# Patient Record
Sex: Male | Born: 1935 | Race: White | Hispanic: No | Smoking: Former smoker
Health system: Southern US, Community
[De-identification: ages and names within clinical notes are randomized; demographics above are authoritative.]

## PROBLEM LIST (undated history)

## (undated) DIAGNOSIS — I10 Essential (primary) hypertension: Secondary | ICD-10-CM

## (undated) DIAGNOSIS — K000 Anodontia: Principal | ICD-10-CM

## (undated) DIAGNOSIS — I5022 Chronic systolic (congestive) heart failure: Secondary | ICD-10-CM

## (undated) DIAGNOSIS — K08109 Complete loss of teeth, unspecified cause, unspecified class: Secondary | ICD-10-CM

## (undated) DIAGNOSIS — I779 Disorder of arteries and arterioles, unspecified: Secondary | ICD-10-CM

## (undated) DIAGNOSIS — E785 Hyperlipidemia, unspecified: Secondary | ICD-10-CM

## (undated) DIAGNOSIS — C8590 Non-Hodgkin lymphoma, unspecified, unspecified site: Secondary | ICD-10-CM

## (undated) DIAGNOSIS — N183 Chronic kidney disease, stage 3 unspecified: Secondary | ICD-10-CM

## (undated) DIAGNOSIS — R042 Hemoptysis: Principal | ICD-10-CM

## (undated) DIAGNOSIS — I2510 Atherosclerotic heart disease of native coronary artery without angina pectoris: Secondary | ICD-10-CM

## (undated) DIAGNOSIS — I340 Nonrheumatic mitral (valve) insufficiency: Secondary | ICD-10-CM

## (undated) DIAGNOSIS — I739 Peripheral vascular disease, unspecified: Secondary | ICD-10-CM

## (undated) DIAGNOSIS — I4891 Unspecified atrial fibrillation: Secondary | ICD-10-CM

## (undated) DIAGNOSIS — N3289 Other specified disorders of bladder: Secondary | ICD-10-CM

## (undated) HISTORY — PX: SOFT TISSUE BIOPSY: SHX1053

## (undated) HISTORY — DX: Anodontia: K00.0

## (undated) HISTORY — DX: Atherosclerotic heart disease of native coronary artery without angina pectoris: I25.10

## (undated) HISTORY — DX: Hemoptysis: R04.2

## (undated) HISTORY — DX: Unspecified atrial fibrillation: I48.91

## (undated) HISTORY — DX: Peripheral vascular disease, unspecified: I73.9

## (undated) HISTORY — DX: Hyperlipidemia, unspecified: E78.5

## (undated) HISTORY — DX: Complete loss of teeth, unspecified cause, unspecified class: K08.109

## (undated) HISTORY — DX: Nonrheumatic mitral (valve) insufficiency: I34.0

## (undated) HISTORY — PX: PORTACATH PLACEMENT: SHX2246

---

## 2001-03-12 ENCOUNTER — Encounter: Payer: Self-pay | Admitting: Cardiology

## 2001-03-12 ENCOUNTER — Inpatient Hospital Stay (HOSPITAL_COMMUNITY): Admission: EM | Admit: 2001-03-12 | Discharge: 2001-03-26 | Payer: Self-pay | Admitting: *Deleted

## 2001-03-13 ENCOUNTER — Encounter: Payer: Self-pay | Admitting: Cardiology

## 2001-03-14 ENCOUNTER — Encounter: Payer: Self-pay | Admitting: Cardiology

## 2001-03-15 ENCOUNTER — Encounter: Payer: Self-pay | Admitting: Thoracic Surgery (Cardiothoracic Vascular Surgery)

## 2001-03-15 ENCOUNTER — Encounter: Payer: Self-pay | Admitting: Cardiology

## 2001-03-15 HISTORY — PX: CORONARY ARTERY BYPASS GRAFT: SHX141

## 2001-03-16 ENCOUNTER — Encounter: Payer: Self-pay | Admitting: Thoracic Surgery (Cardiothoracic Vascular Surgery)

## 2001-03-17 ENCOUNTER — Encounter: Payer: Self-pay | Admitting: Thoracic Surgery (Cardiothoracic Vascular Surgery)

## 2001-03-18 ENCOUNTER — Encounter: Payer: Self-pay | Admitting: Thoracic Surgery (Cardiothoracic Vascular Surgery)

## 2001-03-19 ENCOUNTER — Encounter: Payer: Self-pay | Admitting: Thoracic Surgery (Cardiothoracic Vascular Surgery)

## 2001-03-20 ENCOUNTER — Encounter: Payer: Self-pay | Admitting: Thoracic Surgery (Cardiothoracic Vascular Surgery)

## 2001-03-21 ENCOUNTER — Encounter: Payer: Self-pay | Admitting: Thoracic Surgery (Cardiothoracic Vascular Surgery)

## 2001-03-22 ENCOUNTER — Encounter: Payer: Self-pay | Admitting: Thoracic Surgery (Cardiothoracic Vascular Surgery)

## 2001-03-23 ENCOUNTER — Encounter: Payer: Self-pay | Admitting: Thoracic Surgery (Cardiothoracic Vascular Surgery)

## 2001-04-23 ENCOUNTER — Inpatient Hospital Stay (HOSPITAL_COMMUNITY): Admission: EM | Admit: 2001-04-23 | Discharge: 2001-04-25 | Payer: Self-pay | Admitting: Emergency Medicine

## 2001-04-23 ENCOUNTER — Encounter: Payer: Self-pay | Admitting: Emergency Medicine

## 2004-11-26 ENCOUNTER — Ambulatory Visit: Payer: Self-pay | Admitting: Cardiology

## 2006-03-02 ENCOUNTER — Ambulatory Visit: Payer: Self-pay | Admitting: Cardiology

## 2006-03-07 ENCOUNTER — Ambulatory Visit: Payer: Self-pay | Admitting: Cardiology

## 2006-03-15 ENCOUNTER — Ambulatory Visit: Payer: Self-pay | Admitting: Cardiology

## 2006-04-12 ENCOUNTER — Ambulatory Visit: Payer: Self-pay | Admitting: Cardiology

## 2006-04-13 ENCOUNTER — Ambulatory Visit: Payer: Self-pay | Admitting: Cardiology

## 2006-06-22 ENCOUNTER — Ambulatory Visit: Payer: Self-pay | Admitting: Cardiology

## 2006-06-24 ENCOUNTER — Emergency Department (HOSPITAL_COMMUNITY): Admission: EM | Admit: 2006-06-24 | Discharge: 2006-06-24 | Payer: Self-pay | Admitting: *Deleted

## 2006-06-26 ENCOUNTER — Ambulatory Visit: Payer: Self-pay | Admitting: Cardiology

## 2006-06-29 ENCOUNTER — Ambulatory Visit: Payer: Self-pay | Admitting: Cardiology

## 2006-08-21 ENCOUNTER — Ambulatory Visit: Payer: Self-pay | Admitting: Cardiology

## 2006-09-04 ENCOUNTER — Ambulatory Visit: Payer: Self-pay

## 2006-09-04 ENCOUNTER — Encounter: Payer: Self-pay | Admitting: Cardiology

## 2006-09-04 ENCOUNTER — Ambulatory Visit: Payer: Self-pay | Admitting: Cardiology

## 2006-09-07 ENCOUNTER — Ambulatory Visit: Payer: Self-pay | Admitting: Cardiology

## 2006-10-04 ENCOUNTER — Ambulatory Visit: Payer: Self-pay | Admitting: Cardiology

## 2006-10-04 ENCOUNTER — Ambulatory Visit: Payer: Self-pay

## 2007-03-05 ENCOUNTER — Ambulatory Visit: Payer: Self-pay | Admitting: Cardiology

## 2007-03-19 ENCOUNTER — Ambulatory Visit: Payer: Self-pay | Admitting: Cardiology

## 2007-03-19 LAB — CONVERTED CEMR LAB
ALT: 18 units/L (ref 0–40)
AST: 20 units/L (ref 0–37)
Albumin: 3.9 g/dL (ref 3.5–5.2)
Alkaline Phosphatase: 56 units/L (ref 39–117)
Bilirubin, Direct: 0.2 mg/dL (ref 0.0–0.3)
Cholesterol: 133 mg/dL (ref 0–200)
HDL: 54 mg/dL (ref >39.0)
LDL Cholesterol: 63 mg/dL (ref 0–99)
Pro B Natriuretic peptide (BNP): 271 pg/mL — ABNORMAL HIGH (ref 0.0–100.0)
Total Bilirubin: 0.8 mg/dL (ref 0.3–1.2)
Total CHOL/HDL Ratio: 2.5
Total Protein: 6.8 g/dL (ref 6.0–8.3)
Triglycerides: 78 mg/dL (ref 0–149)
VLDL: 16 mg/dL (ref 0–40)

## 2007-09-13 ENCOUNTER — Ambulatory Visit: Payer: Self-pay | Admitting: Cardiology

## 2007-09-26 ENCOUNTER — Ambulatory Visit: Payer: Self-pay

## 2008-02-28 ENCOUNTER — Ambulatory Visit: Payer: Self-pay | Admitting: Cardiology

## 2008-10-13 ENCOUNTER — Ambulatory Visit: Payer: Self-pay | Admitting: Cardiology

## 2009-11-18 DIAGNOSIS — N183 Chronic kidney disease, stage 3 unspecified: Secondary | ICD-10-CM | POA: Insufficient documentation

## 2009-11-18 DIAGNOSIS — I739 Peripheral vascular disease, unspecified: Secondary | ICD-10-CM

## 2009-11-18 DIAGNOSIS — I2510 Atherosclerotic heart disease of native coronary artery without angina pectoris: Secondary | ICD-10-CM | POA: Insufficient documentation

## 2009-11-18 DIAGNOSIS — F17200 Nicotine dependence, unspecified, uncomplicated: Secondary | ICD-10-CM

## 2009-11-18 DIAGNOSIS — I4891 Unspecified atrial fibrillation: Secondary | ICD-10-CM

## 2009-11-23 ENCOUNTER — Ambulatory Visit: Payer: Self-pay | Admitting: Cardiology

## 2009-11-23 DIAGNOSIS — E785 Hyperlipidemia, unspecified: Secondary | ICD-10-CM | POA: Insufficient documentation

## 2009-11-26 ENCOUNTER — Ambulatory Visit: Payer: Self-pay | Admitting: Cardiology

## 2009-12-07 LAB — CONVERTED CEMR LAB
ALT: 16 units/L (ref 0–53)
AST: 20 units/L (ref 0–37)
Albumin: 3.8 g/dL (ref 3.5–5.2)
BUN: 41 mg/dL — ABNORMAL HIGH (ref 6–23)
Chloride: 107 meq/L (ref 96–112)
Cholesterol: 141 mg/dL (ref 0–200)
GFR calc non Af Amer: 36.51 mL/min (ref >60)
Glucose, Bld: 100 mg/dL — ABNORMAL HIGH (ref 70–99)
Potassium: 4.8 meq/L (ref 3.5–5.1)
Sodium: 140 meq/L (ref 135–145)

## 2009-12-28 ENCOUNTER — Ambulatory Visit: Payer: Self-pay | Admitting: Cardiology

## 2009-12-28 LAB — CONVERTED CEMR LAB
BUN: 34 mg/dL — ABNORMAL HIGH (ref 6–23)
CO2: 29 meq/L (ref 19–32)
Chloride: 105 meq/L (ref 96–112)
Glucose, Bld: 96 mg/dL (ref 70–99)
Potassium: 5.3 meq/L — ABNORMAL HIGH (ref 3.5–5.1)

## 2010-01-04 ENCOUNTER — Ambulatory Visit: Payer: Self-pay | Admitting: Cardiology

## 2010-01-11 LAB — CONVERTED CEMR LAB
CO2: 29 meq/L (ref 19–32)
Calcium: 9.2 mg/dL (ref 8.4–10.5)
Creatinine, Ser: 1.6 mg/dL — ABNORMAL HIGH (ref 0.4–1.5)
Glucose, Bld: 92 mg/dL (ref 70–99)

## 2010-10-12 ENCOUNTER — Encounter: Payer: Self-pay | Admitting: Cardiology

## 2010-10-21 ENCOUNTER — Encounter: Payer: Self-pay | Admitting: Cardiology

## 2010-10-21 DIAGNOSIS — I6529 Occlusion and stenosis of unspecified carotid artery: Secondary | ICD-10-CM | POA: Insufficient documentation

## 2010-10-22 ENCOUNTER — Ambulatory Visit: Payer: Self-pay

## 2010-10-22 ENCOUNTER — Encounter: Payer: Self-pay | Admitting: Cardiology

## 2010-11-23 ENCOUNTER — Encounter: Payer: Self-pay | Admitting: Cardiology

## 2010-11-25 ENCOUNTER — Ambulatory Visit: Payer: Self-pay | Admitting: Cardiology

## 2010-12-02 ENCOUNTER — Telehealth (INDEPENDENT_AMBULATORY_CARE_PROVIDER_SITE_OTHER): Payer: Self-pay | Admitting: *Deleted

## 2010-12-06 ENCOUNTER — Encounter (INDEPENDENT_AMBULATORY_CARE_PROVIDER_SITE_OTHER): Payer: Self-pay | Admitting: *Deleted

## 2010-12-06 ENCOUNTER — Encounter (HOSPITAL_COMMUNITY)
Admission: RE | Admit: 2010-12-06 | Discharge: 2011-01-25 | Payer: Self-pay | Source: Home / Self Care | Attending: Cardiology | Admitting: Cardiology

## 2010-12-06 ENCOUNTER — Ambulatory Visit: Payer: Self-pay | Admitting: Cardiology

## 2010-12-06 ENCOUNTER — Ambulatory Visit: Payer: Self-pay

## 2010-12-06 ENCOUNTER — Encounter: Payer: Self-pay | Admitting: Cardiology

## 2010-12-06 DIAGNOSIS — E782 Mixed hyperlipidemia: Secondary | ICD-10-CM

## 2010-12-13 LAB — CONVERTED CEMR LAB
AST: 22 units/L (ref 0–37)
Alkaline Phosphatase: 55 units/L (ref 39–117)
CO2: 27 meq/L (ref 19–32)
Calcium: 9.5 mg/dL (ref 8.4–10.5)
Creatinine, Ser: 1.5 mg/dL (ref 0.4–1.5)
Glucose, Bld: 93 mg/dL (ref 70–99)
Sodium: 141 meq/L (ref 135–145)
Total Bilirubin: 0.8 mg/dL (ref 0.3–1.2)
Total CHOL/HDL Ratio: 3

## 2010-12-24 ENCOUNTER — Ambulatory Visit: Admission: RE | Admit: 2010-12-24 | Discharge: 2010-12-24 | Payer: Self-pay | Source: Home / Self Care

## 2010-12-24 LAB — CONVERTED CEMR LAB
Chloride: 107 meq/L (ref 96–112)
GFR calc non Af Amer: 47.1 mL/min — ABNORMAL LOW (ref >60.00)
Glucose, Bld: 80 mg/dL (ref 70–99)
Potassium: 5.9 meq/L — ABNORMAL HIGH (ref 3.5–5.1)
Sodium: 143 meq/L (ref 135–145)

## 2010-12-28 ENCOUNTER — Other Ambulatory Visit: Payer: Self-pay | Admitting: Cardiology

## 2010-12-28 ENCOUNTER — Ambulatory Visit: Admission: RE | Admit: 2010-12-28 | Discharge: 2010-12-28 | Payer: Self-pay | Source: Home / Self Care

## 2010-12-28 LAB — BASIC METABOLIC PANEL
BUN: 26 mg/dL — ABNORMAL HIGH (ref 6–23)
CO2: 29 mEq/L (ref 19–32)
Calcium: 9.5 mg/dL (ref 8.4–10.5)
Chloride: 104 mEq/L (ref 96–112)
Creatinine, Ser: 1.6 mg/dL — ABNORMAL HIGH (ref 0.4–1.5)
GFR: 44.08 mL/min — ABNORMAL LOW (ref >60.00)
Glucose, Bld: 74 mg/dL (ref 70–99)
Potassium: 5.1 mEq/L (ref 3.5–5.1)
Sodium: 140 mEq/L (ref 135–145)

## 2010-12-31 ENCOUNTER — Telehealth: Payer: Self-pay | Admitting: Cardiology

## 2011-01-25 NOTE — Miscellaneous (Signed)
  Clinical Lists Changes  Observations: Added new observation of US CAROTID: Essentially stable, mild mixed plaque, bilaterally Elevated LECA velocities 0-39% bilateral ICA stenosis Elevated subclavian artery velocities, bilaterally  f/u 2 years (10/22/2010 10:30)      Carotid Doppler  Procedure date:  10/22/2010  Findings:      Essentially stable, mild mixed plaque, bilaterally Elevated LECA velocities 0-39% bilateral ICA stenosis Elevated subclavian artery velocities, bilaterally  f/u 2 years

## 2011-01-25 NOTE — Miscellaneous (Signed)
Summary: Orders Update  Clinical Lists Changes  Problems: Added new problem of CAROTID ARTERY DISEASE (ICD-433.10) Orders: Added new Test order of Carotid Duplex (Carotid Duplex) - Signed 

## 2011-01-25 NOTE — Miscellaneous (Signed)
Summary: Fluzone Multidose Vial   Fluzone Multidose Vial   Imported By: Roderic Ovens 10/19/2010 09:34:01  _____________________________________________________________________  External Attachment:    Type:   Image     Comment:   External Document

## 2011-01-25 NOTE — Progress Notes (Signed)
Summary: Nuclear pre procedure  Phone Note Outgoing Call Call back at [facility] Phone [phone]   Call placed by: Rea College, CMA,  December 02, 2010 4:43 PM Call placed to: Patient Summary of Call: Reviewed information on Myoview Information Sheet (see scanned document for further details).  Thurston Hole spoke with patient.      Nuclear Med Background Indications for Stress Test: Evaluation for Ischemia, Graft Patency   History: CABG, Echo, Myocardial Infarction  History Comments: '02 CABG, mitral ring annuloplasty     Nuclear Pre-Procedure Cardiac Risk Factors: History of Smoking, Hypertension, Lipids, PVD, RBBB Height (in): 72

## 2011-01-25 NOTE — Assessment & Plan Note (Signed)
Summary: 414.01 1 YR ROV  PFH,RN      Allergies Added: NKDA  Visit Type:  Follow-up Primary Provider:  None  CC:  CAD.  History of Present Illness: The patient presents for followup of coronary disease. Since I last saw him he has done quite well. He does his activities of daily living and this includes light household chores and grocery shopping. With this he denies any chest pressure, neck or arm discomfort. He has no palpitations, presyncope or syncope. He has no PND or orthopnea. He has had no weight gain or edema.  Current Medications (verified): 1)  Vytorin 10-40 Mg Tabs (Ezetimibe-Simvastatin) .[name].. 1 By Mouth Daily 2)  Coreg 25 Mg Tabs (Carvedilol) .[name].. 1 By Mouth Two Times A Day 3)  Enalapril Maleate 10 Mg Tabs (Enalapril Maleate) .... 1/2 By Mouth Daily 4)  Aspirin 81 Mg  Tabs (Aspirin) .[name].. 1 By Mouth Daily 5)  Multivitamins   Tabs (Multiple Vitamin) .[name].. 1 By Mouth Daily  Allergies (verified): No Known Drug Allergies  Past History:  Past Medical History: Reviewed history from 11/18/2009 and no changes required.  1. Coronary artery disease status post coronary artery bypass graft       (Left internal mammary artery  to the left anterior descending       artery, saphenous vein graft to diagonal, saphenous vein graft to       circumflex marginal, saphenous vein graft to posterior descending       artery and sequential saphenous vein graft  to posterolateral).   2. Mildly reduced ejection fraction (40%).   3. Peripheral vascular disease.   4. Mitral ring annuloplasty.   5. Previous tobacco abuse.   6. Postoperative atrial fibrillation.   7. Mild chronic renal insufficiency.   Past Surgical History: Reviewed history from 11/23/2009 and no changes required.  CABG - Coronary artery bypass graft March 2002  Review of Systems       As stated in the HPI and negative for all other systems.   Vital Signs:  Patient profile:   77 year old male Height:      72  inches Weight:      182 pounds BMI:     24.77 Pulse rate:   58 / minute Resp:     16 per minute BP sitting:   124 / 72  (right arm)  Vitals Entered By: Marrion Coy, CNA (November 25, 2010 8:57 AM)  Physical Exam  General:  Well developed, well nourished, in no acute distress. Head:  normocephalic and atraumatic Eyes:  PERRLA/EOM intact; conjunctiva and lids normal. Mouth:  Gums and palate normal. Oral mucosa normal. Neck:  Neck supple, no JVD. No masses, thyromegaly or abnormal cervical nodes. Chest Wall:  well healed sternotomy scar Lungs:  Clear bilaterally to auscultation and percussion. Abdomen:  Bowel sounds positive; abdomen soft and non-tender without masses, organomegaly, or hernias noted. No hepatosplenomegaly. Msk:  Back normal, normal gait. Muscle strength and tone normal. Skin:  Intact without lesions or rashes. Cervical Nodes:  no significant adenopathy Inguinal Nodes:  no significant adenopathy Psych:  Normal affect.   Detailed Cardiovascular Exam  Neck    Carotids: Carotids full and equal bilaterally without bruits.      Neck Veins: Normal, no JVD.    Heart    Inspection: no deformities or lifts noted.      Palpation: normal PMI with no thrills palpable.      Auscultation: regular rate and rhythm, S1, S2  without murmurs, rubs, gallops, or clicks.    Vascular    Abdominal Aorta: no palpable masses, pulsations, or audible bruits.      Femoral Pulses: normal femoral pulses bilaterally.      Pedal Pulses: diminished right dorsalis pedis pulse, diminished right posterior tibial pulse, diminished left dorsalis pedis pulse, and diminished left posterior tibial pulse.      Radial Pulses: normal radial pulses bilaterally.      Peripheral Circulation: no clubbing, cyanosis, or edema noted with normal capillary refill.     EKG  Procedure date:  11/25/2010  Findings:      Sinus rhythm, rate 61, right axis deviation, right bundle branch block  Impression &  Recommendations:  Problem # 1:  CAD (ICD-414.00) He is overdue for a stress perfusion study.  I will order this and continue with risk reduction. Orders: EKG w/ Interpretation (93000)  Problem # 2:  DYSLIPIDEMIA (ICD-272.4) I will check a lipid profile when he returns fasting. His updated medication list for this problem includes:    Vytorin 10-40 Mg Tabs (Ezetimibe-simvastatin) .[name]... 1 by mouth daily  Problem # 3:  RENAL INSUFFICIENCY, CHRONIC (ICD-585.9) I will check a BMET.  Patient Instructions: 1)  Your physician recommends that you schedule a follow-up appointment in: 1 year with Dr Antoine Poche 2)  Your physician recommends that you continue on your current medications as directed. Please refer to the Current Medication list given to you today. 3)  Your physician recommends that you return for a FASTING lipid and liver profile:  272.2  v58.69  4)  Your physician has requested that you have an exercise stress myoview.  For further information please visit [email or web address]  Please follow instruction sheet, as given.

## 2011-01-25 NOTE — Miscellaneous (Signed)
Summary: lexiscan myoview order  Clinical Lists Changes  Orders: Added new Referral order of Nuclear Stress Test (Nuc Stress Test) - Signed

## 2011-01-27 NOTE — Assessment & Plan Note (Signed)
Summary: Cardiology Nuclear Testing  Nuclear Med Background Indications for Stress Test: Evaluation for Ischemia, Graft Patency   History: Abnormal EKG, CABG, Echo, Myocardial Infarction  History Comments: '02 CABG, mitral ring annuloplasty  Symptoms: Dizziness, Light-Headedness, Palpitations    Nuclear Pre-Procedure Cardiac Risk Factors: History of Smoking, Hypertension, Lipids, PVD, RBBB Caffeine/Decaff Intake: None NPO After: 6:00 PM Lungs: clear IV 0.9% NS with Angio Cath: 22g     IV Site: R Forearm IV Started by: Irean Hong, RN Chest Size (in) 40     Height (in): 72 Weight (lb): 180 BMI: 24.50 Tech Comments: Held coreg this am.  Nuclear Med Study 1 or 2 day study:  1 day     Stress Test Type:  Eugenie Birks Reading MD:  Olga Millers, MD     Referring MD:  J.Hochrein Resting Radionuclide:  Technetium 74m Tetrofosmin     Resting Radionuclide Dose:  11 mCi  Stress Radionuclide:  Technetium 34m Tetrofosmin     Stress Radionuclide Dose:  33 mCi   Stress Protocol   Lexiscan: 0.4 mg   Stress Test Technologist:  Milana Na, EMT-P     Nuclear Technologist:  Doyne Keel, CNMT  Rest Procedure  Myocardial perfusion imaging was performed at rest 45 minutes following the intravenous administration of Technetium 8m Tetrofosmin.  Stress Procedure  The patient received IV Lexiscan 0.4 mg over 15-seconds.  Technetium 20m Tetrofosmin injected at 30-seconds.  There were no significant changes with infusion.  Quantitative spect images were obtained after a 45 minute delay.  QPS Raw Data Images:  There is no interference from other nuclear activity. Stress Images:  There is decreased uptake in the inferior wall Rest Images:  There is decreased uptake in the inferior wall. Subtraction (SDS):  There is a fixed defect that is most consistent with a previous infarction. Transient Ischemic Dilatation:  1.07  (Normal <1.22)  Lung/Heart Ratio:  0.27  (Normal <0.45)  Quantitative  Gated Spect Images QGS EDV:  150 ml QGS ESV:  100 ml QGS EF:  33 % QGS cine images:  Global hypokinesis; left ventricular enlargement.   Overall Impression  Exercise Capacity: Lexiscan with no exercise. BP Response: Normal blood pressure response. Clinical Symptoms: No chest pain ECG Impression: No significant ST segment change suggestive of ischemia. Overall Impression: Abnormal lexiscan nuclear study with prior inferior infarct; no ischemia.  Appended Document: Cardiology Nuclear Testing Old infarct.  I suspect that the EF is about the same as previous.  Continue current meds.  Appended Document: Cardiology Nuclear Testing pt aware of results

## 2011-01-27 NOTE — Letter (Signed)
Summary: Outpatient Coinsurance Notice  Outpatient Coinsurance Notice   Imported By: Marylou Mccoy 12/13/2010 18:51:49  _____________________________________________________________________  External Attachment:    Type:   Image     Comment:   External Document

## 2011-01-27 NOTE — Progress Notes (Signed)
Summary: TEST RESULTS   Phone Note Call from Patient   Caller: SISTER/ MARIE Reason for Call: Lab or Test Results Initial call taken by: Judie Grieve,  December 31, 2010 3:45 PM  Follow-up for Phone Call        patient aware of test results. Whitney Maeola Sarah RN  December 31, 2010 3:49 PM  Follow-up by: Whitney Maeola Sarah RN,  December 31, 2010 3:49 PM

## 2011-05-10 NOTE — Assessment & Plan Note (Signed)
[facility]                            CARDIOLOGY OFFICE NOTE   NAME:Gabriel Estrada, Gabriel Estrada                       MRN:          [id number]  DATE:02/28/2008                            DOB:          September 23, 1928    PRIMARY CARE PHYSICIAN:  [name], MD   REASON FOR PRESENTATION:  Evaluate patient with coronary artery disease.   HISTORY OF PRESENT ILLNESS:  The patient is now a 77 year old white  gentleman with coronary disease as described below.  He has done quite  well since I last saw him.  He gets along doing some exercises in his  house though his sister reports he is not as active as he reports.  With  this level of activity, he denies any chest discomfort, neck or arm  discomfort.  He has no palpitation, pre-syncope, syncope.  He has had no  PND or orthopnea.   PAST MEDICAL HISTORY:  1. Coronary artery disease status post coronary artery bypass graft      (Left internal mammary artery  to the left anterior descending      artery, saphenous vein graft to diagonal, saphenous vein graft to      circumflex marginal, saphenous vein graft to posterior descending      artery and sequential saphenous vein graft  to posterolateral).  2. Mildly reduced ejection fraction (40%).  3. Peripheral vascular disease.  4. Mitral ring annuloplasty.  5. Previous tobacco abuse.  6. Postoperative atrial fibrillation.  7. Mild chronic renal insufficiency.   ALLERGIES:  None.   MEDICATIONS:  1. Carvedilol 25 mg b.i.d.  2. Multivitamin.  3. Aspirin 81 mg daily.  4. Vytorin.  5. Enalapril 5 mg daily.   REVIEW OF SYSTEMS:  As stated in the HPI and otherwise negative for  other systems.   PHYSICAL EXAMINATION:  The patient is in no distress.  Blood pressure  134/80, heart rate 65 and regular, weight 183 pounds, body mass index  24.  HEENT: Eyelids unremarkable. Pupils equal, round, and reactive to light.  Fundi not visualized.  NECK: No jugular venous  distention at 45 degrees. Carotid upstroke brisk  and symmetrical. No bruits, no thyromegaly.  LYMPHATICS: No adenopathy.  LUNGS: Clear to auscultation bilaterally.  BACK: No costovertebral angle tenderness.  CHEST: Well-healed sternotomy scar.  HEART: PMI not displaced or sustained, S1-S2 within normal limits, no S3-  S4, 2/6 apical systolic murmur, holosystolic murmur radiating slightly  to the axilla, no diastolic murmurs.  ABDOMEN: Flat, positive bowel sounds, normal in frequency and pitch. No  bruits. No rebound. No guarding. No midline pulsatile mass. No  organomegaly.  SKIN: No rashes, no nodules.  EXTREMITIES: 2+ upper pulse, absent right femoral, 2+ left femoral, left  femoral bruit, absent dorsalis pedis and post tibialis bilaterally, no  cyanosis or clubbing, trace edema.  NEURO:  Oriented to person, place, time, cranial nerves II-XII grossly  intact, motor grossly intact.   EKG: Sinus rhythm, rate 65, interventricular conduction delay, premature  atrial contractions, right axis deviation,  no change from previous  EKGs.   ASSESSMENT/PLAN:  1. Coronary  artery disease.  The patient is having no symptoms related      this. No further cardiovascular testing is suggested.  He will      continue with risk reduction.  2. Hypertension.  Blood pressure is well-controlled and he will      continue on the medications as listed.  3. Cardiomyopathy.  The patient had a mildly reduced ejection      fraction.  He has class I symptoms.  He is on reasonable      medications and will continue with this.  The last ejection      fraction was actually 50%.  4. Peripheral vascular disease.  He is having this followed and      treated with risk reduction as he is not having significant      symptoms.  Does have carotid duplex again in October.  5. Mild chronic renal insufficiency, is followed by Dr. Clent Ridges.  6. Dyslipidemia.  Continue medications as listed.  He has had an      excellent lipid  profile with an HDL 54 and LDL of 63 last year.  7. Followup. I will see him back in 1 year or sooner if needed.     [name], MD, [facility]  Electronically Signed    JH/MedQ  DD: 02/28/2008  DT: 02/28/2008  Job #: [id number]   cc:   [name], MD

## 2011-05-10 NOTE — Assessment & Plan Note (Signed)
Gab[facility]str[facility]                            CARDIOLOGY OFFICE NOTE   NAME:Gabriel Estrada, Gabriel Estrada                       MRN:          [id number]  DATE:09/13/2007                            DOB:          1928/06/15    The primary is Tera Mater. Clent Ridges, MD   REASON FOR PRESENTATION:  Evaluate patient with coronary disease.   HISTORY OF PRESENT ILLNESS:  The patient is a very pleasant 77 year old  gentleman with a prior history as described below.  He has been doing  well since I last saw him other than having lightheadedness.  He thinks  he notices this most when he goes from a seated to a standing position.  It is a slight dizziness.  He does not describe any gait disturbance.  He has had no falls.  He has had no presyncope or syncope.  He has had  no palpitations.  He denies any vertiginous symptoms.  He has had no  loss of speech or motor or visual disturbance.  He denies any chest  discomfort, neck or arm discomfort.  He has had no new shortness of  breath, denies any PND or orthopnea.   PAST MEDICAL HISTORY:  1. Coronary artery disease, status post CABG (LIMA to the LAD, SVG to      diagonal, SVG to circumflex marginal, SVG to PDA, and sequential      SVG to posterolateral).  2. Mildly reduced ejection fraction (40%).  3. Peripheral vascular disease.  4. Mitral ring annuloplasty.  5. Previous tobacco abuse.  6. Postoperative atrial fibrillation.  7. Mild chronic renal insufficiency.   ALLERGIES:  None.   CURRENT MEDICATIONS:  1. Coreg 25 mg b.i.d.  2. Multivitamin.  3. Aspirin 81 mg daily.  4. Vytorin 10/40 mg daily.  5. Enalapril 5 mg daily.   REVIEW OF SYSTEMS:  As stated in the HPI, otherwise negative for other  systems.   PHYSICAL EXAMINATION:  The patient is in no distress.  Blood pressure  slightly elevated today at 140/68.  We also did orthostatics and his  pressure lying was 164/92 and did not fall with standing.  He did feel  slightly dizzy  upon standing, however.  We also walked him around the  office.  His heart rate got up to 76 with mild ambulation.  His oxygen  saturation was at the lowest 93%.  HEENT:  Eyelids unremarkable.  Pupils equal, round, and reactive to  light.  Fundi not visualized.  Oral mucosa unremarkable.  NECK:  No jugular venous distention at 45 degrees.  Carotid upstroke  brisk and symmetric, no bruits, no thyromegaly.  LYMPHATIC:  No cervical, axillary or inguinal adenopathy.  LUNGS:  Clear to auscultation bilaterally.  BACK:  No costovertebral angle tenderness.  CHEST:  Well-healed sternotomy scar.  HEART:  PMI not displaced or sustained, S1 and S2 within normal limits.  No S3, no S4.  A 2/6 apical systolic murmur, holosystolic and radiating  slightly to the axilla, no diastolic murmurs.  ABDOMEN:  Flat, positive bowel sounds, normal in frequency and pitch.  No bruits, no guarding,  no midline pulsatile mass, no hepatomegaly, no  splenomegaly.  SKIN:  No rashes, no nodules.  EXTREMITIES:  2+ upper pulses, absent right femoral, 2+ left femoral,  left femoral bruit, absent dorsalis pedis and posterior tibialis  bilaterally.  No cyanosis, no clubbing, no edema.  NEUROLOGIC:  Oriented to person, place and time.  Cranial nerves II-XII  grossly intact.  Motor grossly intact.   EKG:  Sinus rhythm with right bundle branch block, premature ectopic  complexes, no acute ST-wave change.   ASSESSMENT AND PLAN:  1. Dizziness.  Not clear on the etiology of the patient's dizziness.      He describes orthostatic symptoms but he is not orthostatic.  He      did increase his heart rate slightly, though I might look further      for chronotropic competence with a Holter monitor in the future.      The next step will be carotid Dopplers to evaluate his dizziness,      as he has severe vascular disease elsewhere.  2. Cardiomyopathy.  He has class I symptoms.  His ejection fraction      was up to 50% on the last echo.   He will continue the medications      as listed.  3. Mitral regurgitation.  We will follow this clinically.  4. Coronary artery disease.  He will continue with risk reduction.  5. Peripheral vascular disease.  He is not having any overt symptoms.      He can get follow-up ABIs when he comes back for his carotid      Doppler.   FOLLOW-UP:  We will see him back in about 6 months or sooner if needed.     Gabriel Rotunda, MD, [facility]  Electronically Signed    JH/MedQ  DD: 09/13/2007  DT: 09/14/2007  Job #: [id number]   cc:   [name], MD

## 2011-05-13 NOTE — Discharge Summary (Signed)
[place]  Patient:    Gabriel Estrada, Gabriel Estrada                       MRN: [id number] Adm. Date:  [id number] Disc. Date: 03/26/01 Attending:  Tressie Stalker Dictator:   Lissa Hoard, P.A. CC:         [name], M.D. [facility]  Danice Goltz, M.D.   Discharge Summary  DATE OF BIRTH:  04/24/1928  ADMITTING DIAGNOSES: 1. Chest pain. 2. Acute myocardial infarction. 3. Cardiogenic shock.  SECONDARY DIAGNOSES: 1. Atrial fibrillation. 2. Renal insufficiency. 3. Pneumonia. 4. Mitral regurgitation.  DISCHARGE DIAGNOSES:  Coronary artery disease.  HOSPITAL PROCEDURES: 1. Cardiac catheterization. 2. Upper extremity Doppler exam. 3. Lower extremity Doppler exam. 4. Bilateral carotid duplex. 5. Coronary artery bypass graft times five. 6. Mitral valve repair. 7. Lower extremity venous Doppler.  HOSPITAL COURSE:  Mr. Asbridge was admitted to [facility] on March 12, 2001.  This admission was secondary to chest pain and shortness of breath. Mr. Dacanay was found to have an acute myocardial infarction and developed cardiogenic shock requiring intubation and ventilator support.  He eventually underwent cardiac catheterization revealing significant coronary artery disease.  Dr. Cornelius Moras was consulted; however, because of the patients pulmonary status it was felt the patient was too unstable to undergo coronary artery bypass grafting at this time.  The patient was treated with aggressive antibiotics and was eventually deemed stable to undergo coronary artery bypass graft.  Also note worthy, the patient developed atrial fibrillation prior to undergoing coronary artery bypass graft.  This was treated with Amiodarone. The patient subsequently underwent a coronary artery bypass graft times five with the left internal mammary artery anastomosed to left anterior descending artery, saphenous vein graft to the diagonal artery, sequential saphenous vein graft  to the posterior descending and posterior lateral arteries and a saphenous vein graft to the circumflex artery.  Intraoperatively, the patient also underwent a transesophageal echocardiogram which showed moderate mitral regurgitation.  Because of this, the patient underwent mitral valve ring annuloplasty with a #30 annuloplasty ring.  This procedure was done on March 15, 2001 by Dr. Cornelius Moras.  Postoperatively, the patient required prolonged ventilatory support secondary to pneumonia.  He also required vasopressor support with inotropic agents and intra-aortic balloon pump.  These modalities were weaned off appropriately. His postoperative pneumonia was treated aggressively with IV antibiotics and pulmonary toilet.  Because of the patients prolonged intubation, he also required total parenteral nutrition. This was weaned as appropriate once the patient was extubated and tolerating p.o. intake.  The patient was anticoagulated with Coumadin secondary to the repair done on his mitral valve.  He continued to make slow progress throughout his postoperative course.  He was subsequently discharged home in stable, satisfactory condition despite his prolonged hospitalization on March 26, 2001.  DISCHARGE MEDICATIONS:  1. Darvocet 100 one to two tablets every four to six hours as needed for     pain.  2. Amiodarone 200 mg two tablets twice daily.  3. Pulmicort inhaler one to two puffs twice daily.  4. Aspirin 81 mg one tablet daily.  5. Digoxin 0.125 mg one daily.  6. Coreg 3.125 mg one daily twice daily.  7. Hydralazine 25 mg one tablet twice daily.  8. Coumadin, dose will be determined at time of discharge.  9. Niferex 150 one tablet twice daily. 10. Colace 100 mg one tablet twice daily. 11. Combivent inhaler two puffs twice daily. 12.  Lasix 40 mg one daily. 13. Potassium chloride 20 mEq one tablet twice daily.  DISCHARGE ACTIVITY:  The patient is told to avoid driving, strenuous activity and  lift no objects over 10 pounds.  DISCHARGE DIET:  Low fat and low salt.  WOUND CARE:  The patient is told to shower and clean his incision with soap and water.  DISPOSITION:  Home.  FOLLOW-UP:  The patient was told to follow up with his cardiologist, Dr. [name] in two weeks.  He was told to bring x-ray taken by Dr. Antoine Poche to Dr. Barry Dienes office.  He is also told to have his INR drawn at Dr. Lindaann Slough office in two to three days from discharge.  This will further dictate how much Coumadin he is to take.  He was further instructed to follow up with Dr. Cornelius Moras at the [facility] office in three weeks.  The office will call him to verify the exact time and date of this appointment.DD:  03/25/01 TD:  03/26/01 Job: [id number]

## 2011-05-13 NOTE — Procedures (Signed)
[place]  Patient:    Gabriel Estrada, Gabriel Estrada                       MRN: [id number] Proc. Date: 03/14/01 Adm. Date:  [id number] Attending:  Tressie Stalker                           Procedure Report  PROCEDURE:  Direct current cardioversion.  PREOPERATIVE DIAGNOSIS:  Atrial fibrillation.  POSTOPERATIVE DIAGNOSIS:  Atrial fibrillation.  DESCRIPTION OF PROCEDURE:  The patient was submitted to a series of three shocks delivered synchronously at 200 joules.  Each restored sinus rhythm, but only for a minute or two.  On each occasion, reverted to atrial fibrillation. The rate of atrial fibrillation now is somewhat less than it was previously with a mean of about 110.  The patients augmented blood pressure is in the mid-80s, and the mean blood pressures in the low 70s, and it was elected at this point because of the failure of cardioversion to maintain him on his current medical regimen. DD:  03/15/01 TD:  03/15/01 Job: [id number]

## 2011-05-13 NOTE — Discharge Summary (Signed)
[place]  Patient:    Gabriel Estrada, Gabriel Estrada                       MRN: [id number] Adm. Date:  [id number] Disc. Date: 04/25/01 Attending:  Ronaldo Miyamoto Dictator:   Lavella Hammock, P.A. CC:         Charlaine Dalton. Sherene Sires, M.D. [facility]  [name], M.D. in primary care   Referring Physician Discharge [place]  DATE OF BIRTH:  [birth year]  PROCEDURES:  None.  HOSPITAL COURSE:  Gabriel Estrada is a 77 year old male with a history of bypass surgery who had presented to the hospital on March 12, 2001 with acute MI associated with cardiogenic shock and requiring an intra-aortic balloon pump and intubation.  He had CABG x 5 and a mitral valve ring on March 15, 2001 and was doing well at home until the day before admission, when he started getting short of breath.  It became worse overnight and he came to the ER the next day.  There he had some bronchospasm and increased edema on chest x-ray so he was admitted for further evaluation and diuresis.  His Coreg and digoxin were stopped and he was continued on his other medications.  His INR was elevated, so his Coumadin was held and his INR went from 4.2 to 4.9 but was 3.3 at discharge.  The patient responded well to IV diuretics and improved over the next two days.  His weight dropped by greater than 3 pounds and his symptoms significantly improved.  His oxygen saturation was in the 80s, and he was put on mask and it improved to 95% but by discharge his saturation was greater than 90% on room air.  He had a 2-D echocardiogram done which showed an EF of 30-40% and no problems with the mitral valve ring.  With the improvement in his symptoms and stability in his labs, he was considered stable for discharge on Apr 25, 2001.  LABORATORY DATA:  Chest x-ray:  Progressive airspace disease attributed to increase in edema.  Associated bibasilar atelectasis with the heart enlarged.  Laboratory values:  Room air ABG  show pH 7.52, PCO2 31, PO2 74, bicarb 27. Hemoglobin 11.0, hematocrit 32.8, wbcs 10.1, platelets 394.  INR 3.3 at discharge.  Sodium 137, potassium 3.8, chloride 102, CO2 28, BUN 20, creatinine 1.1, glucose 138.  CK-MB and troponin I negative for MI.  TSH 2.475.  Digoxin 0.8.  Echocardiogram:  Overall left ventricular systolic function moderately decreased with EF estimated 30-40%.  Mitral valve ring appears normal.  Mean transmittal gradient was 3 mmHg.  Area by pressure half-time was 3.38 cm. Technically limited study.  PFTs:  Moderate obstructive disease with restrictive disease cannot be excluded by spirometry alone.  Marked decrease in DSB.  Emphysema.  Post bronchodilator study not performed.  DISCHARGE CONDITION:  Improved.  CONSULTS:  None.  COMPLICATIONS:  None.  DISCHARGE DIAGNOSES:  1. Congestive heart failure.  2. Left ventricular dysfunction with an ejection fraction of 30-40% by     echocardiogram this admission.  3. Status post aortocoronary bypass surgery x 5 on March 15, 2001.  4. Status post mitral valve ring on March 15, 2001.  5. Preoperative nonsustained ventricular tachycardia and postoperative     atrial fibrillation treated with amiodarone.  6. Family history of coronary artery disease.  7. Remote history of tobacco use.  8. Chronic obstructive pulmonary disease.  9. Acute myocardial infarction in  March 2002. 10. Cardiogenic shock secondary to myocardial infarction requiring     intra-aortic balloon pump and intubation. 11. Chronic anticoagulation. 12. Status post inguinal hernia repair.  DISCHARGE INSTRUCTIONS:  ACTIVITY:  His activity level is to be as tolerated.  DIET:  He is to stick to a low fat and salt diet.  FOLLOW-UP:  He is to follow up with the Coumadin clinic Friday at noon.  He is to obtain a chest x-ray before the office visit with Dr. Sherene Sires and he is to see Dr. Sherene Sires on Wednesday, May 8 at 2:30 p.m.  He is to follow up with Dr. Clent Ridges  and call for an appointment.  He is to see the P.A. for Dr. Antoine Poche on Monday, May 13 at 11 a.m.  DISCHARGE MEDICATIONS:  1. Lasix 40 mg q.d.  2. Coumadin 2.5 mg and 5 mg tomorrow.  3. Imdur 30 mg a day.  4. Amiodarone 200 mg a day.  5. Hydralazine 25 mg t.i.d.  6. Iron q.d.  7. Centrum q.d.  8. Darvocet p.r.n.  9. He is not to take digoxin or Coreg for now. DD:  04/25/01 TD:  04/25/01 Job: [id number]

## 2011-05-13 NOTE — Op Note (Signed)
[place]  Patient:    BRODERIC, BARA                       MRN: [id number] Proc. Date: 03/15/01 Adm. Date:  [id number] Attending:  Tressie Stalker CC:         [name], M.D. [facility]  Daisey Must, M.D. [facility]   Operative Report  PREOPERATIVE DIAGNOSIS:  Severe three-vessel coronary artery disease, critical left main coronary stenosis, status post acute myocardial infarction complicated by cardiogenic shock, class 4 congestive heart failure, moderate mitral regurgitation with postinfarction class 4 unstable angina.  POSTOPERATIVE DIAGNOSIS:  Severe three-vessel coronary artery disease, critical left main coronary stenosis, status post acute myocardial infarction complicated by cardiogenic shock, class 4 congestive heart failure, moderate mitral regurgitation with postinfarction class 4 unstable angina.  OPERATION PERFORMED:  Urgent median stenotomy for coronary artery bypass grafting x 5 (left internal mammary artery to distal left anterior descending coronary artery, saphenous vein graft to first diagonal branch, saphenous vein graft to second circumflex marginal branch, saphenous vein graft to posterior descending coronary artery and sequential saphenous vein graft to the left posterolateral branch).  Mitral valve annuloplasty (#30 mm Seguin ring annuloplasty).  SURGEON:  Salvatore Decent. Cornelius Moras, M.D.  ASSISTANT:  Sherrie George, P.A.  ANESTHESIA:  General.  INDICATIONS FOR PROCEDURE:  The patient is a 77 year old white male with longstanding history of heavy tobacco abuse who was admitted through the emergency department on March 12, 2001 in cardiogenic shock with acute respiratory insufficiency and hypoxia requiring intubation and emergent cardiac catheterization.  This demonstrated critical left main coronary stenosis associated with thrombus in the left main coronary artery as well as chronically occluded left anterior descending  coronary artery and right coronary artery.  There was diffuse coronary artery disease with evidence of ischemic cardiomyopathy complicated by acute myocardial infarction.  The patient remained in cardiogenic shock and was resuscitated with intra-aortic balloon pump placement.  A full consultation note has been dictated previously.  The patient was evaluated in conjunction with Dr. [name].  The patient recovered somewhat hemodynamically after his initial resuscitation although he demonstrated evidence of class 4 postinfarction angina requiring persistent intra-aortic balloon pump placement.  Despite this, the patients renal function remained stable and his shock temporarily resolved.  It is felt that the patients only chance for long term survival resides with an attempt at surgical intervention.  OPERATIVE CONSENT:  The patients family was counseled at length regarding the indications and potential benefits of coronary artery bypass grafting.  They understand the extremely high risk nature of surgical intervention as well as the significant possibility that the patient will not survive.  They understand the relative hopeless prognosis with continued medical therapy. They accept the associated risks of surgery including but not limited to the risks of death, stroke, myocardial infarction, cardiogenic shock, congestive heart failure, low output syndrome resulting in mesenteric, renal or lower extremity ischemia, arrhythmia, infection, respiratory failure requiring prolonged ventilation, and recurrent coronary artery disease .  They accept all associated risks and desire to proceed as described.  DESCRIPTION OF PROCEDURE:  The patient was brought directly from the cardiac intensive care unit to the operating room on the morning of March 21.  The patient remained intubated with intra-aortic balloon pump in place through the right common femoral approach.  The patient was placed in  the supine position on the operating room table.  A Swan-Ganz catheter was placed through the right internal jugular  approach under the care and direction of Dr. Arta Bruce.  Pulmonary artery pressures are noted to be between 65 and 70 systolic upon initial placement of the Swan-Ganz catheter.  Adequate general anesthesia was ascertained and additional intravenous access was established including a radial arterial line.  Transesophageal echocardiogram was performed by Dr. Michelle Piper.  This demonstrates the present of ischemic cardiomyopathy with global ischemic dysfunction as well as severe anterolateral hypokinesis or akinesis and severe inferior hypokinesis.  In addition there is moderate mitral regurgitation despite concommitante intra-aortic balloon counter pulsation at one to one.  There was no flow reversal in the pulmonary veins but there remained a dilated mitral annulus with a central jet of mitral regurgitation that filled the entire left atrium.  The patients chest, abdomen, both groins and both lower extremities were prepared and draped in the sterile manner.  A median sternotomy incision was performed and a left internal mammary artery was dissected from the chest wall and prepared for bypass grafting.  Simultaneously, saphenous vein was obtained from the patients left thigh and the upper portion of the left lower leg through a longitudinal incision.  Both the left internal mammary artery and the saphenous vein were all felt to be acceptable quality conduit for bypass grafting.  The patient was heparinized systemically.  The pericardium was opened.  There was severe biventricular enlargement.  The ascending aorta was palpated and was notably free of any palpable plaques or calcifications.  The ascending aorta was cannulated uneventfully.  A superior vena cava cannula was placed directly into the superior vena cava just above the junction with the right atrium.  A retrograde  cardioplegia catheter was  placed through the right atrium into the coronary sinus.  Adequate heparinization was verified.  Cardiopulmonary bypass was begun.  An inferior vena cava cannula was placed through the right atrium below to establish dual venous cannulation.  The surface of the heart was inspected.  There was diffuse scarring in both the anterolateral wall as well as the inferior wall consistent with previous old myocardial infarction.  There was diffuse epicardial coronary disease involving all of the coronary arteries.  There appeared to be codominant coronary circulation with a fairly large posterolateral branch off the distal left circumflex coronary artery which was not appreciated at the time of cardiac catheterization.  Portions of saphenous vein and the left internal mammary artery were trimmed to appropriate length.  Umbilical tapes were placed around both the superior and inferior vena cava.  The interatrial groove was dissected from associated structures and prepared for left atriotomy.  A cardioplegia catheter was placed in the ascending aorta.  A temperature probe was placed in the left ventricular septum and a styrofoam pad was placed to protect the left phrenic nerve from thermal injury.  The patient was cooled to 28 degrees systemic temperature.  The aortic crossclamp was applied and cardioplegia was delivered initially in antegrade fashion through the aortic root.  Iced saline slush was applied for topical hypothermia.  The initial cardioplegic arrest and myocardial cooling were excellent.  Additional doses of cardioplegia were administered both antegrade through the subsequently placed vein grafts, antegrade through the aortic root, and retrograde through the coronary sinus catheter intermittently throughout the crossclamp portion of the operation to maintain septal temperature below 15 degrees centigrade.  The following distal coronary anastomoses were  performed:  (1) The posterior descending coronary artery was grafted with a saphenous vein graft in a side-to-side fashion using running 7-0 Prolene suture.  This  coronary measured 1.5 mm in diameter and is of fair quality.  (2) The posterolateral branch off the distal left circumflex coronary artery is graftable with a sequential saphenous vein graft using the vein placed to the posterior descending coronary artery.  This coronary measured 1.4 mm in diameter and was of fair quality at best.  (3) The second circumflex marginal branch was grafted with saphenous vein graft in an end-to-side fashion using running 7-0 Prolene suture.  This coronary measured 1.5 mm in diameter and is of fair to good quality.  (4) The first diagonal branch off the left anterior descending coronary artery was grafted with a saphenous vein graft in end-to-side fashion.  This coronary measured 1.0 mm in diameter and is of poor quality.  (5) The distal left anterior descending coronary artery was grafted with left internal mammary artery using running 8-0 Prolene suture.  This coronary measured 1.6 mm in diameter and was of fair to good quality.  A left atriotomy incision was performed and the mitral valve was exposed with a self-retaining retractor.  Exposure was felt to be excellent.  The mitral valve was inspected and was notable for normal-appearing leaflets and some valvular apparatus.  There was evidence of mitral annular dilatation.  There was no redundancy of either anterior or posterior leaflet.  There was no restriction of the posterior leaflet.  Ring annuloplasty was performed using interrupted horizontal mattress 2-0 Ethibond sutures circumferentially around the entire annulus.  A 30 mm Seguin [employer] annuloplasty ring was inserted uneventfully.  After completion of the annuloplasty, the mitral repair was tested by injecting iced saline into the left ventricle.  There appeared to be perfect coaptation  of the leaflets with no residual mitral regurgitation. Rewarming was begun.  The left atriotomy was closed using two-layer closure of running 3-0 Prolene suture.  Two of the three proximal saphenous vein anastomoses were performed directly to the ascending aorta prior to removal of the aortic crossclamp. The proximal anastomosis to the saphenous vein graft to the posterior descending and posterolateral branches sequentially was postponed as it appeared that the saphenous vein was not of sufficient length to reach the aorta due to the patients massively enlarged right ventricle.  The left internal mammary artery was reperfused and the septal temperature was noted to rise rapidly and dramatically.  One final hot shot of retrograde cardioplegia was administered.  The patient was placed in Trendelenburg position and the heart was allowed to fill while the lungs were ventilated in order to evacuate all air from the pulmonary veins and left heart circulation.  The aortic crossclamp was removed after satisfactory deairing with a total crossclamp time of 127 minutes.  An additional segment of saphenous vein was obtained from the patients right side through a longitudinal incision.  This portion of saphenous vein was sewn end-to-end to the proximal end of the saphenous vein to the posterior descending and posterolateral branch.  This saphenous vein was then sewn directly to the ascending aorta under a separate partial occlusion clamp after trimming to an appropriate length.  Epicardial pacing wires were affixed to the right ventricular outflow tract into the right atrial appendage.  All proximal and distal anastomoses were inspected for hemostasis and appropriate graft orientation.  The retrograde cardioplegia catheter was removed.  The patient was loaded with milrinone infusion and begun on low-dose epinephrine infusion.  Renal dose dopamine was maintained throughout.  Amiodarone drip was also  restarted.  The patient was rewarmed to greater than 37  degrees temperature.  The inferior vena cava cannula was removed.  The patient was weaned from cardiopulmonary bypass without diffulty.  The patients rhythm at separation from bypass was junctional rhythm requiring AV  sequential pacing.  The patient was weaned from bypass on milrinone infusion at 0.375 mcg per kg per minute and epinephrine infusion at 0.03 mcg per kg per minute.  Total cardiopulmonary bypass time for the operation was 191 minutes. Follow-up transesophageal echocardiogram performed following separation from bypass demonstrates perhap some signs of improvement in overall left ventricular function with no residual mitral regurgitation.  The mitral annuloplasty ring appears to be well seated.  No new abnormalities were identified.  The venous and arterial cannulae were removed uneventfully.  Protamine was administered to reverse the anticoagulation.  The mediastinum and the left chest were irrigated with saline solution containing vancomycin.  Meticulous surgical hemostasis was ascertained.  The patient was transfused one eight-pack of adult platelets due to thrombocytopenia with a platelet count of 60,000 during cardiopulmonary bypass.  The patient was transfused two units of fresh frozen plasma for coagulopathy following reversal of heparin. The patient was transfused a total of two units packed red blood cells during cbp for anemia.  The mediastinum and both left and right pleural spaces were drained with four chest tubes placed through separate stab incisions inferiorly.  The median sternotomy was closed in routine fashion.  The deep subcutaneous tissues of both the left and right thigh incisions were closedin multiple layers in routine fashion.  All skin incisions were closed with skin staples.  The patient tolerated the procedure fairly well although he remained somewhat labile.  The patient returned to atrial  fibrillation with rapid ventricular response between 120 and 130 after closure of the chest.  An additional bolus of amiodarone was given and an additional dose of digoxin was given.  The patients heart rate began to return to more acceptable levels prior to transport.  There were no intraoperative complications.  All sponge, needle and instrument counts were verified correct at the completion of the operation. DD:  03/15/01 TD:  03/16/01 Job: [id number]

## 2011-05-13 NOTE — Assessment & Plan Note (Signed)
[facility]                              CARDIOLOGY OFFICE NOTE   NAME:Gabriel Estrada, Gabriel Estrada                       MRN:          [id number]  DATE:08/21/2006                            DOB:          1928-10-26    PRIMARY CARE PHYSICIAN:  Dr. Gershon Crane.   REASON FOR PRESENTATION:  Evaluate patient with coronary disease and leg  pain.   HISTORY OF PRESENT ILLNESS:  The patient comes to my office today at the  request of his sister, who is also my patient.  He is 77 years old.  His  family reports that he is not doing as much as he used to.  They report that  he complains of leg pain.  He reports pain in both his hips.  He does not do  a lot of walking.  He cannot do much standing without getting that  discomfort.  He does not describe cramping in his calves.  He does not  describe any chest or arm discomfort.  He has had no palpitations,  presyncope or syncope.  He has had dyspnea, which has been chronic; this is  with mild to moderate exertion and walking a short distance on level ground.  He has had no resting shortness of breath and denies any PND or orthopnea.   Of note, the patient had hyperkalemia.  He has since had his ACE inhibitor  held and spironolactone discontinued.  This was followed and resolved.   PAST MEDICAL HISTORY:  1. Coronary artery disease, status post cardiogenic shock and CABG (LIMA      to the LAD, SVG to diagonal, SVG to circumflex marginal, SVG to PDA and      sequential SVG to posterolateral), reduced ejection fraction (EF 40%).  2. Peripheral vascular disease with reduced ankle-brachial indices, status      post mitral annuloplasty ring.  3. Previous tobacco use.  4. Postoperative atrial fibrillation.  5. Mild chronic renal insufficiency.   ALLERGIES:  None.   MEDICATIONS:  1. Coreg 25 mg b.i.d.  2. Multivitamin.  3. Aspirin 81 mg daily.  4. Vytorin 10/40 daily.   REVIEW OF SYSTEMS:  As stated in the HPI and  otherwise negative for other  systems.   PHYSICAL EXAMINATION:  GENERAL:  The patient is in no distress.  VITAL SIGNS:  Blood pressure 156/76, pulse rate 73 and regular, weight 186  pounds, body mass index 25.  HEENT:  Eyelids unremarkable.  Pupils are equal, round and reactive to  light.  Fundi not visualized.  Oral mucosa unremarkable.  NECK:  No jugular venous distention.  Wave form within normal limits.  Carotid upstroke brisk and symmetric, no bruits, no thyromegaly.  LYMPHATICS:  No cervical, axillary or inguinal adenopathy.  LUNGS:  Clear to auscultation bilaterally.  BACK:  No costovertebral angle tenderness.  CHEST:  Well-healed sternotomy scar.  HEART:  PMI not displaced or sustained.  S1 and S2 within normal limits.  No  S3, no S4.  A 2/6 apical systolic murmur, slightly holosystolic and  radiating to the axilla, no diastolic murmurs.  ABDOMEN:  Flat.  Positive bowel sounds, normal in frequency and pitch.  No  bruits, no rebound, no guarding, no midline pulsatile mass, no organomegaly.  SKIN:  No rashes, no nodules.  EXTREMITIES:  Upper pulses 2+, absent femoral with left femoral bruit,  absent popliteals bilaterally, absent posterior tibialis and dorsalis pedis  bilaterally, dependent rubor.  No cyanosis, no clubbing.  Mild bilateral  lower extremity edema.  NEUROLOGIC:  Oriented to person, place and time.  Cranial nerves II-XII  grossly intact.  Motor grossly intact.   LABORATORY AND ACCESSORY CLINICAL DATA:  EKG:  Sinus rhythm, premature  ectopic complex, intraventricular conduction delay, right axis deviation.   ASSESSMENT AND PLAN:  1. Coronary disease:  The patient has coronary disease, status post      coronary artery bypass graft.  I am going to have a low threshold to do      a stress perfusion study, as it has been a while on him.  He is not      complaining of chest discomfort.  However, he does have dyspnea.  He      has become relatively sedentary, so it is  hard to know whether this has      become worse.  I am going to start with an echocardiogram, as I      appreciate a murmur.  He had a past history of mitral regurgitation,      status post annuloplasty ring.  This will also allow me to assess his      ejection fraction which he had at 40%.  2. Leg pain:  The patient is describing leg pain.  I am not clear that      this is claudication, although he clearly has very bad peripheral      vascular disease with severely reduced ankle-brachial indices in the      past.  I will send him to see Dr. Samule Ohm for further evaluation.  3. Dyslipidemia:  He had an excellent lipid profile and he will continue      this.  4. Cardiomyopathy:  I am going to go ahead and restart ACE inhibitor, but      watch his kidney function very carefully.  I think he got into trouble      with hyperkalemia because of the spironolactone.  5. Hypertension:  This will be managed with the enalapril as above.  6. Followup:  He is going to come back in no greater than 2 weeks for his      BMET.  He is going      to have his echocardiogram as above.  I will send him to see Dr.      Samule Ohm.  I would like to see him in about 2 months or sooner if needed.                               [name], MD, [facility]    JH/MedQ  DD:  08/21/2006  DT:  08/22/2006  Job #:  [id number]   cc:   Salvadore Farber, MD  Tera Mater. Clent Ridges, MD

## 2011-05-13 NOTE — Progress Notes (Signed)
[facility]                          PERIPHERAL VASCULAR OFFICE NOTE   NAME:Gabriel Estrada, Gabriel Estrada                       MRN:          [id number]  DATE:09/07/2006                            DOB:          1928/08/29    PRIMARY CARE PHYSICIAN:  [name], M.D.   REFERRING PHYSICIAN:  [name], MD, [facility].   REASON FOR CONSULTATION:  Claudication.   HISTORY OF PRESENT ILLNESS:  Gabriel Estrada is a 77 year old gentleman with  atherosclerotic coronary artery disease and bilateral SFA occlusions  documented in 2003.  The ABIs have been approximately 0.5 bilaterally since  then.  He has some relatively mild bilateral calf discomfort when walking on  a treadmill which he does for exercise.  However, he does not have similar  discomfort in his activities of daily living.  He has never had any rest  pain or tissue loss.  He feels his symptoms are minimally lifestyle  limiting. He does have a history of chronic renal insufficiency with  hyperkalemia that occurred with increased ACE inhibitor dosing.   PAST MEDICAL HISTORY:  1. Coronary artery disease, status post myocardial infarction, complicated      by shock in 2002, CABG 2002, mitral valve ring 2002.  2. Atherosclerotic peripheral arterial disease, atrial fibrillation      postoperatively only.  3. Chronic renal insufficiency with creatinine ranging from 1.5 to 1.9.  4. Hypertension.  5. Hypercholesterolemia.  6. History of congestive heart failure with ejection fraction of 40%.   ALLERGIES:  None known.   CURRENT MEDICATIONS:  1. Coreg 25 mg twice per day.  2. Multivitamin.  3. Aspirin 81 mg per day.  4. Vytorin 10/40 one at night.  5. Enalapril 5 mg per day.   SOCIAL HISTORY:  The patient is retired Civil Service fast streamer for [employer].  Worked primarily in [place] and [place] though he had grown up in [place]  and has subsequently moved back to the area.  He walks on the treadmill  fairly  regularly. He is single with two grown children.  He quit smoking in  1991.  Denies significant alcohol use and denies illicit drug use.   FAMILY HISTORY:  1. Father died of malignancy at 46.  Details unknown to patient.  2. Mother died at 93 of myocardial infarction.  3. Two brothers died of myocardial infarction, one in 73 and one in      77.  4. Three sisters all have coronary disease in their 15s.   REVIEW OF SYSTEMS:  Remarkable for wearing glasses.  He has occasional  exertional dyspnea.  Review of systems is otherwise negative in detail  except as above.   REVIEW OF SYSTEMS:  GENERAL:  Denies fever, chills, night sweats.  RESPIRATORY:  Has chronic cough, productive of white sputum.  GI:  Occasional nausea and GERD symptoms.  CARDIAC:  Denies chest pain, PND,  orthopnea, edema.  It is otherwise negative in detail except as above.   PHYSICAL EXAMINATION:  GENERAL:  He is generally a well-appearing gentleman  in no distress.  VITAL SIGNS:  Heart rate 78, blood  pressure 140/82 on the right and 136/84  on the left.  He is 6 feet tall and weighs 184 pounds.  HEENT: Normal.  SKIN: Normal.  MUSCULOSKELETAL: Normal.  LYMPH NODES:  He has no cervical, supraclavicular or axillary  lymphadenopathy.  NECK:  There is no jugular venous distension and no thyromegaly.  LUNGS:  Respiratory effort is normal.  Lungs are clear to auscultation.  CARDIOVASCULAR:  He has a nondisplaced point of maximal impulse.  Regular  rate and rhythm without murmur, rub or gallop.  ABDOMEN:  Soft, nondistended, nontender.  There is no hepatosplenomegaly.  Bowel sounds are normal.  EXTREMITIES:  Warm without clubbing, cyanosis or edema or ulceration.  PULSES:  Carotid pulses are 2+ bilaterally without bruit.  Femoral pulses  are 2+ bilaterally.  Popliteal, DP, and PT pulses are not palpable on either  side.  NEUROLOGIC:  He is alert and oriented x3 with normal affect and normal  neurologic exam.    LABORATORY DATA:  His ABIs in 2004 were 0.57 on the right and 0.51 on the  left.   IMPRESSION/RECOMMENDATIONS:  1. Claudication.  Minimally lifestyle limiting. Recommend continuing      conservative management of his chronic superficial femoral artery      occlusions. With ejection fraction of 40% and minimal symptoms, I do      not think any potential benefit of cilostazol is worth the      arrhythmogenic risk.  2. Chronic renal insufficiency:  His history of hyperkalemia in response      to ACE inhibitor as      well as his diffuse atherosclerotic disease raise the question of renal      artery stenosis as a contributor.  Will, therefore, check renal duplex.                                   Gabriel Farber, MD   WED/MedQ  DD:  09/11/2006  DT:  09/12/2006  Job #:  [id number]   cc:   [name], MD  [name], MD, [facility]

## 2011-05-13 NOTE — Assessment & Plan Note (Signed)
[facility]                            CARDIOLOGY OFFICE NOTE   NAME:Estrada, Gabriel                       MRN:          [id number]  DATE:03/05/2007                            DOB:          07/03/1928    PRIMARY CARE PHYSICIAN:  Dr. Clent Estrada.   REASON FOR PRESENTATION:  Evaluate patient with coronary disease.   HISTORY OF PRESENT ILLNESS:  The patient returns for followup of the  above.  He has done well since I last saw him.  He did see Dr. Samule Estrada  for his peripheral vascular disease and is being managed conservatively  for this.  Actually he thinks his leg pain is less significant, he only  has this occasionally.  He does not have any chest discomfort.  He is  having no palpitations, presyncope, or syncope.  He is having no PND or  orthopnea.   PAST MEDICAL HISTORY:  1. Coronary artery disease, status post CABG (LIMA to the LAD, SVG to      diagonal, SVG to circumflex marginal, SVG to PDA and sequential SVG      to posterolateral).  2. Mildly reduced ejection fraction (40%).  3. Peripheral vascular disease.  4. Mitral annuloplasty ring.  5. Previous tobacco abuse.  6. Postoperative atrial fibrillation.  7. Mild chronic renal insufficiency.   ALLERGIES:  NONE.   MEDICATIONS:  1. Coreg 25 mg b.i.d.  2. Multivitamin.  3. Aspirin 81 mg daily.  4. Vytorin 10/40 daily.  5. Enalapril 5 mg daily.   REVIEW OF SYSTEMS:  As stated in the HPI and otherwise negative for  other systems.   PHYSICAL EXAMINATION:  The patient is in no distress.  Blood pressure  128/72, heart rate 66 and regular, weight 181 pounds, body mass index  24.  HEENT:  Eyelids unremarkable, pupils equally round and reactive to  light, fundi not visualized, oral mucosa unremarkable.  NECK:  No jugular venous distention at 45 degrees, carotid upstroke  brisk and symmetrical, no bruits, no thyromegaly.  LYMPHATICS:  No adenopathy.  LUNGS:  Clear to auscultation bilaterally.  BACK:  No costovertebral angle tenderness.  CHEST:  Well healed sternotomy scar.  HEART:  PMI not displaced or sustained, S1 and S2 within normal limits,  no S3, no S4, 2/6 apical systolic murmur holosystolic and radiating to  the axilla, no diastolic murmurs.  ABDOMEN:  Flat, positive bowel sounds normal in frequency and pitch, no  bruits, no rebound, no guarding, no midline pulsatile mass, no  hepatomegaly, splenomegaly.  SKIN:  No rashes, no nodules.  EXTREMITIES:  With 2+ pulses throughout, no edema, no cyanosis, no  clubbing.  NEURO:  Oriented to person, place and time, cranial nerves II-XII  grossly intact.   EKG:  Sinus rhythm, rate 66, right bundle branch block, right axis  deviation, no acute ST-T wave changes.   ASSESSMENT/PLAN:  1. Coronary disease.  The patient is having no symptoms related to      this.  No further cardiovascular testing is suggested.  2. Cardiomyopathy.  He has Class 1 symptoms and his ejection fraction  was up to 50% on his last echo.  No further cardiovascular testing      is suggested.  He will continue on the medications as listed.  3. Mitral regurgitation.  This is mild and will be watched clinically.  4. Risk reduction.  I will check a lipid profile and liver enzymes.  I      will also check a BMET at that time.  5. Followup.  I will see him back in 6 months or sooner if needed.     [name], MD, [facility]  Electronically Signed    JH/MedQ  DD: 03/05/2007  DT: 03/05/2007  Job #: [id number]   cc:   Gabriel Senior A. Gabriel Ridges, MD

## 2011-05-13 NOTE — Procedures (Signed)
[place]  Patient:    Gabriel Estrada, Gabriel Estrada                       MRN: [id number] Proc. Date: 03/14/01 Adm. Date:  [id number] Attending:  Tressie Stalker                           Procedure Report  PREOPERATIVE DIAGNOSIS:  Atrial fibrillation.  POSTOPERATIVE DIAGNOSIS:  Sinus rhythm.  PROCEDURE:  Direct cardioversion.  DESCRIPTION OF PROCEDURE:  The patient was submitted to anesthesia under the care of Dr. Jacklynn Bue.  A 200-joule shock was delivered synchronously in atrial fibrillation, restoring sinus rhythm.  The patient reverted to atrial fibrillation.  A 200-joule shock was redelivered in sinus rhythm, and the patient again reverted to atrial fibrillation, and a 200-joule shock was delivered for the third time, restoring sinus rhythm, and this held.  At that point, it was elected to maintain him on his amiodarone.  IMPRESSION:  Successful cardioversion of atrial fibrillation that was associated with rapid ventricular response and poorly tolerated hemodynamically. DD:  03/15/01 TD:  03/15/01 Job: [id number]

## 2011-05-13 NOTE — Procedures (Signed)
[place]  Patient:    Gabriel Estrada, Gabriel Estrada                       MRN: [id number] Proc. Date: 03/15/01 Attending:  Kaylyn Layer. Michelle Piper, M.D. CC:         Anesthesia Department   Procedure Report  PROCEDURE:  Transesophageal echocardiographic monitoring during coronary artery bypass grafting and mitral reinsertion.  DESCRIPTION OF PROCEDURE:  I placed a transesophageal echo probe into Gabriel Estrada, who presented to the operating room today for coronary artery bypass grafting.  He had been in cardiogenic shock and had an acute myocardial infarction and had been brought to the operating room for coronary artery bypass grafting.  He was brought to the operating room intubated, on an intra-aortic balloon pump.  After uneventful line placement and induction of anesthesia, the transesophageal echo probe was easily passed into the stomach. Short axis view of the left ventricle showed marked decrease in wall motion _____, especially on the inferior and lateral walls of the left ventricle. The septum moved okay.  The right ventricle was okay.  To the mitral valve, there was some amount of redundancy of the anterior leaflet of the mitral valve.  The valve structurally seemed to coapt normally; however, on placing color flow Doppler across the valve, there was 2+ mitral regurgitation.  The mitral annulus seemed dilated.  The left atrium looked normal, and flow of the pulmonary veins was full.  The left atrial appendage was normal.  The interatrial septum was normal.  Tricuspid valve showed mild tricuspid incompetence.  The right atrium was normal.  Aortic outflow tract was normal. The aortic valve was mildly thickened and calcified with some supravalvular turbulence.  There was no aortic insufficiency.  The patient was then placed on cardiopulmonary bypass by Dr. Cornelius Moras, and the decision was made to place a mitral valve ring to structurally correct the mitral root  dilatation and mitral insufficiency.  The patient had a mitral ring placed and coronary artery bypass grafting done and was then disconnected from cardiopulmonary bypass using significant inotropes.  Initially prior to coming off bypass, there was minimal intracardiac air, and the examination after coming off bypass showed a somewhat improved left ventricle.  There was still hypokinesis of the inferior and lateral walls; however, there did seem to be some improvement globally.  The valvular ring in the mitral annulus could be seen clearly, and there was no mitral regurgitation.  The left atrium was unchanged.  The right side of the heart was unchanged.  Aortic valve was unchanged.  The chest was closed.  The transesophageal echo probe was removed, and the patient was taken to the intensive care unit in stable condition. DD:  03/15/01 TD:  03/16/01 Job: [id number]

## 2011-05-26 ENCOUNTER — Other Ambulatory Visit: Payer: Self-pay | Admitting: Cardiology

## 2011-08-30 ENCOUNTER — Other Ambulatory Visit: Payer: Self-pay | Admitting: Cardiology

## 2011-11-25 ENCOUNTER — Encounter: Payer: Self-pay | Admitting: *Deleted

## 2011-11-29 ENCOUNTER — Encounter: Payer: Self-pay | Admitting: Cardiology

## 2011-11-29 ENCOUNTER — Ambulatory Visit (INDEPENDENT_AMBULATORY_CARE_PROVIDER_SITE_OTHER): Payer: Medicare Other | Admitting: Cardiology

## 2011-11-29 VITALS — BP 130/68 | HR 71 | Resp 18 | Ht 72.0 in | Wt 181.2 lb

## 2011-11-29 DIAGNOSIS — E782 Mixed hyperlipidemia: Secondary | ICD-10-CM

## 2011-11-29 DIAGNOSIS — I4891 Unspecified atrial fibrillation: Secondary | ICD-10-CM

## 2011-11-29 MED ORDER — WARFARIN SODIUM 5 MG PO TABS: ORAL_TABLET | ORAL | Status: DC

## 2011-11-29 NOTE — Progress Notes (Signed)
   HPI The patient returns for followup of his known coronary disease and mild ischemic cardiomyopathy. Last year he had a stress perfusion study which demonstrated previous old infarct but no significant ischemia. He had a mild to moderately reduced ejection fraction. His bypass grafts are now 75 years old. He has done well since I last saw him.  The patient denies any new symptoms such as chest discomfort, neck or arm discomfort. There has been no new shortness of breath, PND or orthopnea. There have been no reported palpitations, presyncope or syncope.  Today I note him to be in fibrillation but he actually does not feel this. He's had no weight gain or edema. He remains active doing a lot of groceries and walking around the grocery store.  No Known Allergies  Current Outpatient Prescriptions  Medication Sig Dispense Refill  . carvedilol (COREG) 25 MG tablet TAKE ONE TABLET BY MOUTH TWICE DAILY  60 tablet  3  . enalapril (VASOTEC) 10 MG tablet TAKE ONE-HALF TABLET BY MOUTH EVERY DAY  30 tablet  6  . VYTORIN 10-40 MG per tablet TAKE ONE TABLET BY MOUTH EVERY DAY  30 each  3    Past Medical History  Diagnosis Date  . Coronary artery disease   . A-fib   . PVD (peripheral vascular disease)   . Hyperlipidemia   . Tobacco user   . Chronic kidney disease, unspecified   . Acute myocardial infarction 03/15/01  . CHF (congestive heart failure)     class 4 congestive heart failure  . Mitral regurgitation     moderate;  with postinfarction class 4 unstable angina    Past Surgical History  Procedure Date  . Coronary artery bypass graft 03/15/01    x 5    ROS:  As stated in the HPI and negative for all other systems.  PHYSICAL EXAM BP 130/68  Pulse 71  Resp 18  Ht 6' (1.829 m)  Wt 181 lb 3.2 oz (82.192 kg)  BMI 24.58 kg/m2 GENERAL:  Well appearing HEENT:  Pupils equal round and reactive, fundi not visualized, oral mucosa unremarkable NECK:  No jugular venous distention, waveform  within normal limits, carotid upstroke brisk and symmetric, no bruits, no thyromegaly LYMPHATICS:  No cervical, inguinal adenopathy LUNGS:  Clear to auscultation bilaterally BACK:  No CVA tenderness CHEST:  Well healed sternotomy scar HEART:  PMI not displaced or sustained,S1 and S2 within normal limits, no S3, no S4, no clicks, no rubs, no murmurs, irregular ABD:  Flat, positive bowel sounds normal in frequency in pitch, no bruits, no rebound, no guarding, no midline pulsatile mass, no hepatomegaly, no splenomegaly EXT:  2 plus pulses throughout, no edema, no cyanosis no clubbing SKIN:  No rashes no nodules NEURO:  Cranial nerves II through XII grossly intact, motor grossly intact throughout PSYCH:  Cognitively intact, oriented to person place and time  EKG:  Atrial fibrillation, right axis deviation, IVCD.    ASSESSMENT AND PLAN

## 2011-11-29 NOTE — Patient Instructions (Addendum)
Please start Coumadin 5 mg a day Follow up with Coumadin Clinic appointment in approximately 5 days. The current medical regimen is effective;  continue present plan and medications.  In 2 weeks please return fasting for a lipid profile. Please have holter monitor placed the same day. Please see Dr Antoine Poche back in one month.  Coumadin (Warfarin) is a blood thinner (anticoagulant) medicine. It is used to keep clots from forming in your blood. When you take warfarin, you may bruise easily. You may also bleed a little longer if you cut yourself.  Before taking warfarin, tell your doctor if:  You take any other medicine for your heart or blood pressure.   You are pregnant.   You plan to get pregnant.   You are breastfeeding.   You are allergic to any medicine.  HOME CARE  Take warfarin as told by your doctor. Do not stop the medicine unless your doctor tells you to.   Take your medicine at the same time every day.   Do not take anything that has aspirin in it unless your doctor says it is okay.   Do not drink alcohol.   Tell all your doctors and dentists that you are taking warfarin before they treat you or give you any medicines.   Keep all your appointments for doctor visits and blood tests.   Keep warfarin out of reach of children. Do not share warfarin with anyone.   Eat about the same amount of vitamin K foods every day.   High vitamin K foods: Beef liver. Pork liver. Green tea. Broccoli. Brussels sprouts. Cauliflower. Chickpeas. Kale. Spinach. Turnip greens.   Medium vitamin K foods: Chicken liver. Pork tenderloin. Cheddar cheese. Rolled oats. Coffee. Asparagus. Cabbage. Iceberg lettuce.   Low vitamin K foods: Apples. Butter. Bananas. Skim or 1% milk. Orange rice. Canned pears. White bread. Strawberries. Corn. Tomatoes. Green beans. Eggs. Potatoes. Tomasa Blase. Pumpkin. Chicken breasts. Ground beef. Oil (except soybean oil).  GET HELP RIGHT AWAY IF:  You miss a dose of  warfarin. Do not take 2 doses at the same time.   You have a skin rash.   You have heavy or unusual bleeding.   There is blood in your pee (urine) or poop (stool).   You have side effects from medicine that do not get better after a few days.  MAKE SURE YOU:  Understand these instructions.   Will watch your condition.   Will get help right away if you are not doing well or get worse.  Document Released: 01/14/2011 Document Revised: 08/24/2011 Document Reviewed: 01/14/2011 [facility] Patient Information 2012 [place], [place].  Atrial Fibrillation Your caregiver has diagnosed you with atrial fibrillation (AFib). The heart normally beats very regularly; AFib is a type of irregular heartbeat. The heart rate may be faster or slower than normal. This can prevent your heart from pumping as well as it should. AFib can be constant (chronic) or intermittent (paroxysmal). CAUSES  Atrial fibrillation may be caused by:  Heart disease, including heart attack, coronary artery disease, heart failure, diseases of the heart valves, and others.   Blood clot in the lungs (pulmonary embolism).   Pneumonia or other infections.   Chronic lung disease.   Thyroid disease.   Toxins. These include alcohol, some medications (such as decongestant medications or diet pills), and caffeine.  In some people, no cause for AFib can be found. This is referred to as Lone Atrial Fibrillation. SYMPTOMS   Palpitations or a fluttering in your chest.  A vague sense of chest discomfort.   Shortness of breath.   Sudden onset of lightheadedness or weakness.  Sometimes, the first sign of AFib can be a complication of the condition. This could be a stroke or heart failure. DIAGNOSIS  Your description of your condition may make your caregiver suspicious of atrial fibrillation. Your caregiver will examine your pulse to determine if fibrillation is present. An EKG (electrocardiogram) will confirm the diagnosis. Further  testing may help determine what caused you to have atrial fibrillation. This may include chest x-ray, echocardiogram, blood tests, or CT scans. PREVENTION  If you have previously had atrial fibrillation, your caregiver may advise you to avoid substances known to cause the condition (such as stimulant medications, and possibly caffeine or alcohol). You may be advised to use medications to prevent recurrence. Proper treatment of any underlying condition is important to help prevent recurrence. PROGNOSIS  Atrial fibrillation does tend to become a chronic condition over time. It can cause significant complications (see below). Atrial fibrillation is not usually immediately life-threatening, but it can shorten your life expectancy. This seems to be worse in women. If you have lone atrial fibrillation and are under 20 years old, the risk of complications is very low, and life expectancy is not shortened. RISKS AND COMPLICATIONS  Complications of atrial fibrillation can include stroke, chest pain, and heart failure. Your caregiver will recommend treatments for the atrial fibrillation, as well as for any underlying conditions, to help minimize risk of complications. TREATMENT  Treatment for AFib is divided into several categories:  Treatment of any underlying condition.   Converting you out of AFib into a regular (sinus) rhythm.   Controlling rapid heart rate.   Prevention of blood clots and stroke.  Medications and procedures are available to convert your atrial fibrillation to sinus rhythm. However, recent studies have shown that this may not offer you any advantage, and cardiac experts are continuing research and debate on this topic. More important is controlling your rapid heartbeat. The rapid heartbeat causes more symptoms, and places strain on your heart. Your caregiver will advise you on the use of medications that can control your heart rate. Atrial fibrillation is a strong stroke risk. You can  lessen this risk by taking blood thinning medications such as Coumadin (warfarin), or sometimes aspirin. These medications need close monitoring by your caregiver. Over-medication can cause bleeding. Too little medication may not protect against stroke. HOME CARE INSTRUCTIONS   If your caregiver prescribed medicine to make your heartbeat more normally, take as directed.   If blood thinners were prescribed by your caregiver, take EXACTLY as directed.   Perform blood tests EXACTLY as directed.   Quit smoking. Smoking increases your cardiac and lung (pulmonary) risks.   DO NOT drink alcohol.   DO NOT drink caffeinated drinks (e.g. coffee, soda, chocolate, and leaf teas). You may drink decaffeinated coffee, soda or tea.   If you are overweight, you should choose a reduced calorie diet to lose weight. Please see a registered dietitian if you need more information about healthy weight loss. DO NOT USE DIET PILLS as they may aggravate heart problems.   If you have other heart problems that are causing AFib, you may need to eat a low salt, fat, and cholesterol diet. Your caregiver will tell you if this is necessary.   Exercise every day to improve your physical fitness. Stay active unless advised otherwise.   If your caregiver has given you a follow-up appointment, it is very  important to keep that appointment. Not keeping the appointment could result in heart failure or stroke. If there is any problem keeping the appointment, you must call back to this facility for assistance.  SEEK MEDICAL CARE IF:  You notice a change in the rate, rhythm or strength of your heartbeat.   You develop an infection or any other change in your overall health status.  SEEK IMMEDIATE MEDICAL CARE IF:   You develop chest pain, abdominal pain, sweating, weakness or feel sick to your stomach (nausea).   You develop shortness of breath.   You develop swollen feet and ankles.   You develop dizziness, numbness, or  weakness of your face or limbs, or any change in vision or speech.  MAKE SURE YOU:   Understand these instructions.   Will watch your condition.   Will get help right away if you are not doing well or get worse.  Document Released: 12/12/2005 Document Revised: 08/24/2011 Document Reviewed: 07/16/2008 [facility] Patient Information 2012 [place], [place].

## 2011-11-29 NOTE — Assessment & Plan Note (Signed)
Lab Results  Component Value Date   CHOL 134 12/06/2010   HDL 51.80 12/06/2010   LDLCALC 65 12/06/2010   TRIG 85.0 12/06/2010   CHOLHDL 3 12/06/2010   I will repeat a fasting lipid.  He has been at goal.

## 2011-11-29 NOTE — Assessment & Plan Note (Signed)
He is now in this rhythm. He was seen in the distant past. Now that he is demonstrating this again with his CHADS score he needs warfarin. We discussed this. His sisters on this he is familiar with the drug. I will start him in followup in our clinic. I will check a Holter in about 2 weeks to make sure he has reasonable rate control.

## 2011-11-29 NOTE — Assessment & Plan Note (Signed)
He has mild plaque and he is due for followup in 2013. I reviewed the results from last year.

## 2011-11-29 NOTE — Assessment & Plan Note (Signed)
He has had no new symptoms since a stress test last year. He will continue with risk reduction.

## 2011-12-05 ENCOUNTER — Ambulatory Visit (INDEPENDENT_AMBULATORY_CARE_PROVIDER_SITE_OTHER): Payer: Medicare Other | Admitting: *Deleted

## 2011-12-05 DIAGNOSIS — Z7901 Long term (current) use of anticoagulants: Secondary | ICD-10-CM | POA: Insufficient documentation

## 2011-12-05 DIAGNOSIS — I4891 Unspecified atrial fibrillation: Secondary | ICD-10-CM

## 2011-12-05 LAB — POCT INR: INR: 2.4

## 2011-12-05 NOTE — Patient Instructions (Signed)

## 2011-12-13 ENCOUNTER — Ambulatory Visit (INDEPENDENT_AMBULATORY_CARE_PROVIDER_SITE_OTHER): Payer: Medicare Other | Admitting: *Deleted

## 2011-12-13 ENCOUNTER — Other Ambulatory Visit (INDEPENDENT_AMBULATORY_CARE_PROVIDER_SITE_OTHER): Payer: Medicare Other | Admitting: *Deleted

## 2011-12-13 ENCOUNTER — Encounter (INDEPENDENT_AMBULATORY_CARE_PROVIDER_SITE_OTHER): Payer: Medicare Other

## 2011-12-13 DIAGNOSIS — Z7901 Long term (current) use of anticoagulants: Secondary | ICD-10-CM

## 2011-12-13 DIAGNOSIS — I4891 Unspecified atrial fibrillation: Secondary | ICD-10-CM

## 2011-12-13 DIAGNOSIS — E782 Mixed hyperlipidemia: Secondary | ICD-10-CM

## 2011-12-13 LAB — LIPID PANEL
Cholesterol: 117 mg/dL (ref 0–200)
HDL: 52.9 mg/dL (ref >39.00)
LDL Cholesterol: 46 mg/dL (ref 0–99)
Triglycerides: 93 mg/dL (ref 0.0–149.0)

## 2011-12-19 ENCOUNTER — Encounter: Payer: Self-pay | Admitting: *Deleted

## 2011-12-22 ENCOUNTER — Ambulatory Visit (INDEPENDENT_AMBULATORY_CARE_PROVIDER_SITE_OTHER): Payer: Medicare Other | Admitting: *Deleted

## 2011-12-22 DIAGNOSIS — Z7901 Long term (current) use of anticoagulants: Secondary | ICD-10-CM

## 2011-12-22 DIAGNOSIS — I4891 Unspecified atrial fibrillation: Secondary | ICD-10-CM

## 2011-12-30 ENCOUNTER — Other Ambulatory Visit: Payer: Self-pay | Admitting: Cardiology

## 2012-01-06 ENCOUNTER — Ambulatory Visit (INDEPENDENT_AMBULATORY_CARE_PROVIDER_SITE_OTHER): Payer: Medicare Other | Admitting: *Deleted

## 2012-01-06 ENCOUNTER — Ambulatory Visit (INDEPENDENT_AMBULATORY_CARE_PROVIDER_SITE_OTHER): Payer: Medicare Other | Admitting: Cardiology

## 2012-01-06 ENCOUNTER — Encounter: Payer: Self-pay | Admitting: Cardiology

## 2012-01-06 VITALS — BP 120/72 | HR 70 | Ht 68.0 in | Wt 184.0 lb

## 2012-01-06 DIAGNOSIS — E782 Mixed hyperlipidemia: Secondary | ICD-10-CM

## 2012-01-06 DIAGNOSIS — I4891 Unspecified atrial fibrillation: Secondary | ICD-10-CM

## 2012-01-06 DIAGNOSIS — IMO0002 Reserved for concepts with insufficient information to code with codable children: Secondary | ICD-10-CM | POA: Insufficient documentation

## 2012-01-06 DIAGNOSIS — I2510 Atherosclerotic heart disease of native coronary artery without angina pectoris: Secondary | ICD-10-CM

## 2012-01-06 DIAGNOSIS — Z7901 Long term (current) use of anticoagulants: Secondary | ICD-10-CM

## 2012-01-06 DIAGNOSIS — I6529 Occlusion and stenosis of unspecified carotid artery: Secondary | ICD-10-CM

## 2012-01-06 MED ORDER — DIGOXIN 125 MCG PO TABS: 125.0000 ug | ORAL_TABLET | Freq: Every day | ORAL | Status: DC

## 2012-01-06 NOTE — Assessment & Plan Note (Signed)
Lab Results  Component Value Date   CHOL 117 12/13/2011   HDL 52.90 12/13/2011   LDLCALC 46 12/13/2011   TRIG 93.0 12/13/2011   CHOLHDL 2 12/13/2011   This looked good.

## 2012-01-06 NOTE — Assessment & Plan Note (Signed)
The patient has no new sypmtoms.  No further cardiovascular testing is indicated.  We will continue with aggressive risk reduction and meds as listed.  

## 2012-01-06 NOTE — Progress Notes (Signed)
   HPI The patient returns for followup of his atrial fibrillation.  At the last visit he was noted to have this. I started him on anticoagulation. He wore a Holter monitor which demonstrated some increased rates with activity. He doesn't notice his fibrillation. He has no chest pressure, neck or arm discomfort. He doesn't notice any palpitations and has had no presyncope or syncope. He denies any new shortness of breath, PND or orthopnea. He has had no weight gain or edema. He has tolerated the anticoagulation. He has had a cold with some cough for a while but this seems to be improving.  NKDA  Current Outpatient Prescriptions  Medication Sig Dispense Refill  . carvedilol (COREG) 25 MG tablet TAKE ONE TABLET BY MOUTH TWICE DAILY  60 tablet  3  . enalapril (VASOTEC) 10 MG tablet TAKE ONE-HALF TABLET BY MOUTH EVERY DAY  30 tablet  6  . VYTORIN 10-40 MG per tablet TAKE ONE TABLET BY MOUTH EVERY DAY  30 each  3  . warfarin (COUMADIN) 5 MG tablet Or as directed  30 tablet  3    Past Medical History  Diagnosis Date  . Coronary artery disease   . A-fib   . PVD (peripheral vascular disease)   . Hyperlipidemia   . Tobacco user   . Chronic kidney disease, unspecified   . Acute myocardial infarction 03/15/01  . CHF (congestive heart failure)     class 4 congestive heart failure  . Mitral regurgitation     moderate;  with postinfarction class 4 unstable angina    Past Surgical History  Procedure Date  . Coronary artery bypass graft 03/15/01    x 5    ROS:  As stated in the HPI and negative for all other systems.  PHYSICAL EXAM BP 120/72  Pulse 70  Ht 5\' 8"  (1.727 m)  Wt 184 lb (83.462 kg)  BMI 27.98 kg/m2 GENERAL:  Well appearing HEENT:  Pupils equal round and reactive, fundi not visualized, oral mucosa unremarkable NECK:  No jugular venous distention, waveform within normal limits, carotid upstroke brisk and symmetric, no bruits, no thyromegaly LYMPHATICS:  No cervical, inguinal  adenopathy LUNGS:  Clear to auscultation bilaterally BACK:  No CVA tenderness CHEST:  Well healed sternotomy scar HEART:  PMI not displaced or sustained,S1 and S2 within normal limits, no S3, no S4, no clicks, no rubs, no murmurs, irregular ABD:  Flat, positive bowel sounds normal in frequency in pitch, no bruits, no rebound, no guarding, no midline pulsatile mass, no hepatomegaly, no splenomegaly EXT:  2 plus pulses throughout, no edema, no cyanosis no clubbing SKIN:  No rashes no nodules NEURO:  Cranial nerves II through XII grossly intact, motor grossly intact throughout PSYCH:  Cognitively intact, oriented to person place and time  ASSESSMENT AND PLAN

## 2012-01-06 NOTE — Assessment & Plan Note (Signed)
He has mild plaque and he is due for followup in 2013. I reviewed the results from last year. 

## 2012-01-06 NOTE — Patient Instructions (Signed)
Please start Digoxin 0.125 mg a day Continue all other medications as listed  Please have a digoxin level drawn in 10 days.  Your physician has requested that you have an echocardiogram in 3 months. Echocardiography is a painless test that uses sound waves to create images of your heart. It provides your doctor with information about the size and shape of your heart and how well your heart's chambers and valves are working. This procedure takes approximately one hour. There are no restrictions for this procedure.  Follow up with Dr Antoine Poche in 3 months

## 2012-01-06 NOTE — Assessment & Plan Note (Signed)
I am going add digoxin. In about 10 days I will check a dig level. He will continue his anticoagulation. I see him back in 3 months at which time I will get an echocardiogram and probably further evaluate the rate.

## 2012-01-16 ENCOUNTER — Other Ambulatory Visit: Payer: Medicare Other

## 2012-01-16 ENCOUNTER — Other Ambulatory Visit: Payer: Medicare Other | Admitting: *Deleted

## 2012-01-16 DIAGNOSIS — I4891 Unspecified atrial fibrillation: Secondary | ICD-10-CM

## 2012-01-17 LAB — DIGOXIN LEVEL: Digoxin Level: 1.6 ng/mL (ref 0.8–2.0)

## 2012-01-22 ENCOUNTER — Other Ambulatory Visit: Payer: Self-pay | Admitting: Cardiology

## 2012-01-27 ENCOUNTER — Ambulatory Visit (INDEPENDENT_AMBULATORY_CARE_PROVIDER_SITE_OTHER): Payer: Medicare Other

## 2012-01-27 ENCOUNTER — Encounter: Payer: Medicare Other | Admitting: *Deleted

## 2012-01-27 DIAGNOSIS — I4891 Unspecified atrial fibrillation: Secondary | ICD-10-CM

## 2012-01-27 DIAGNOSIS — Z7901 Long term (current) use of anticoagulants: Secondary | ICD-10-CM

## 2012-01-27 LAB — POCT INR: INR: 2.1

## 2012-02-24 ENCOUNTER — Ambulatory Visit (INDEPENDENT_AMBULATORY_CARE_PROVIDER_SITE_OTHER): Payer: Medicare Other

## 2012-02-24 DIAGNOSIS — I4891 Unspecified atrial fibrillation: Secondary | ICD-10-CM

## 2012-02-24 DIAGNOSIS — Z7901 Long term (current) use of anticoagulants: Secondary | ICD-10-CM

## 2012-03-16 ENCOUNTER — Other Ambulatory Visit: Payer: Self-pay | Admitting: *Deleted

## 2012-03-16 DIAGNOSIS — I4891 Unspecified atrial fibrillation: Secondary | ICD-10-CM

## 2012-03-16 MED ORDER — CARVEDILOL 25 MG PO TABS: 25.0000 mg | ORAL_TABLET | Freq: Two times a day (BID) | ORAL | Status: DC

## 2012-03-16 MED ORDER — EZETIMIBE-SIMVASTATIN 10-40 MG PO TABS: 1.0000 | ORAL_TABLET | Freq: Every day | ORAL | Status: DC

## 2012-03-16 MED ORDER — ENALAPRIL MALEATE 10 MG PO TABS: 5.0000 mg | ORAL_TABLET | Freq: Every day | ORAL | Status: DC

## 2012-03-16 MED ORDER — WARFARIN SODIUM 5 MG PO TABS: ORAL_TABLET | ORAL | Status: DC

## 2012-03-16 MED ORDER — DIGOXIN 125 MCG PO TABS: 125.0000 ug | ORAL_TABLET | Freq: Every day | ORAL | Status: DC

## 2012-03-27 ENCOUNTER — Other Ambulatory Visit: Payer: Self-pay

## 2012-03-27 ENCOUNTER — Ambulatory Visit (HOSPITAL_COMMUNITY): Payer: Medicare Other | Attending: Cardiology

## 2012-03-27 DIAGNOSIS — I059 Rheumatic mitral valve disease, unspecified: Secondary | ICD-10-CM | POA: Insufficient documentation

## 2012-03-27 DIAGNOSIS — I4891 Unspecified atrial fibrillation: Secondary | ICD-10-CM

## 2012-03-27 DIAGNOSIS — F17200 Nicotine dependence, unspecified, uncomplicated: Secondary | ICD-10-CM | POA: Insufficient documentation

## 2012-03-27 DIAGNOSIS — I079 Rheumatic tricuspid valve disease, unspecified: Secondary | ICD-10-CM | POA: Insufficient documentation

## 2012-03-30 ENCOUNTER — Other Ambulatory Visit: Payer: Self-pay | Admitting: Cardiology

## 2012-04-06 ENCOUNTER — Ambulatory Visit (INDEPENDENT_AMBULATORY_CARE_PROVIDER_SITE_OTHER): Payer: Medicare Other | Admitting: *Deleted

## 2012-04-06 DIAGNOSIS — Z7901 Long term (current) use of anticoagulants: Secondary | ICD-10-CM

## 2012-04-06 DIAGNOSIS — I4891 Unspecified atrial fibrillation: Secondary | ICD-10-CM

## 2012-04-06 LAB — POCT INR: INR: 2.4

## 2012-04-09 ENCOUNTER — Other Ambulatory Visit: Payer: Self-pay | Admitting: Cardiology

## 2012-04-09 DIAGNOSIS — I4891 Unspecified atrial fibrillation: Secondary | ICD-10-CM

## 2012-04-09 NOTE — Telephone Encounter (Signed)
Pt [facility]

## 2012-05-02 ENCOUNTER — Other Ambulatory Visit: Payer: Self-pay | Admitting: Cardiology

## 2012-05-18 ENCOUNTER — Ambulatory Visit (INDEPENDENT_AMBULATORY_CARE_PROVIDER_SITE_OTHER): Payer: Medicare Other | Admitting: *Deleted

## 2012-05-18 DIAGNOSIS — I4891 Unspecified atrial fibrillation: Secondary | ICD-10-CM

## 2012-05-18 DIAGNOSIS — Z7901 Long term (current) use of anticoagulants: Secondary | ICD-10-CM

## 2012-05-18 LAB — POCT INR: INR: 2.3

## 2012-05-25 ENCOUNTER — Other Ambulatory Visit: Payer: Self-pay | Admitting: Cardiology

## 2012-06-15 ENCOUNTER — Ambulatory Visit (INDEPENDENT_AMBULATORY_CARE_PROVIDER_SITE_OTHER): Payer: Medicare Other | Admitting: Pharmacist

## 2012-06-15 DIAGNOSIS — I4891 Unspecified atrial fibrillation: Secondary | ICD-10-CM

## 2012-06-15 DIAGNOSIS — Z7901 Long term (current) use of anticoagulants: Secondary | ICD-10-CM

## 2012-06-29 ENCOUNTER — Ambulatory Visit (INDEPENDENT_AMBULATORY_CARE_PROVIDER_SITE_OTHER): Payer: Medicare Other | Admitting: Cardiology

## 2012-06-29 ENCOUNTER — Encounter: Payer: Self-pay | Admitting: Cardiology

## 2012-06-29 VITALS — BP 102/66 | HR 60 | Resp 16 | Ht 72.0 in | Wt 180.8 lb

## 2012-06-29 DIAGNOSIS — I2510 Atherosclerotic heart disease of native coronary artery without angina pectoris: Secondary | ICD-10-CM

## 2012-06-29 DIAGNOSIS — IMO0002 Reserved for concepts with insufficient information to code with codable children: Secondary | ICD-10-CM

## 2012-06-29 DIAGNOSIS — I6529 Occlusion and stenosis of unspecified carotid artery: Secondary | ICD-10-CM

## 2012-06-29 DIAGNOSIS — E785 Hyperlipidemia, unspecified: Secondary | ICD-10-CM

## 2012-06-29 DIAGNOSIS — I4892 Unspecified atrial flutter: Secondary | ICD-10-CM

## 2012-06-29 DIAGNOSIS — I4891 Unspecified atrial fibrillation: Secondary | ICD-10-CM

## 2012-06-29 NOTE — Patient Instructions (Addendum)
Your physician wants you to follow-up in: 6 months.   You will receive a reminder letter in the mail two months in advance. If you don't receive a letter, please call our office to schedule the follow-up appointment.  You are due for a carotid doppler in October 2013.  If you do not receive a reminder in the mail, please call us for an appt  Your physician recommends that you return for lab work in: a lipid profile (cholesterdol) at your next visit in October

## 2012-06-29 NOTE — Assessment & Plan Note (Signed)
The patient  tolerates this rhythm and rate control and anticoagulation. We will continue with the meds as listed.  

## 2012-06-29 NOTE — Assessment & Plan Note (Signed)
I will follow up a lipid in the fall.  [facility] was [facility] in the fall of last year.

## 2012-06-29 NOTE — Assessment & Plan Note (Signed)
He had mild plaque in this is to be followed again this October.

## 2012-06-29 NOTE — Assessment & Plan Note (Signed)
The patient has no new sypmtoms.  No further cardiovascular testing is indicated.  We will continue with aggressive risk reduction and meds as listed.  

## 2012-06-29 NOTE — Progress Notes (Signed)
   HPI The patient returns for followup of his atrial fibrillation and CAD.  Since I last saw him he has done well.  The patient denies any new symptoms such as chest discomfort, neck or arm discomfort. There has been no new shortness of breath, PND or orthopnea. There have been no reported palpitations, presyncope or syncope.  He doesn't notice his atrial fibrillation. He's had no bleeding issues or other problems with his warfarin.  NKDA  Current Outpatient Prescriptions  Medication Sig Dispense Refill  . carvedilol (COREG) 25 MG tablet Take 1 tablet (25 mg total) by mouth 2 (two) times daily with a meal.  180 tablet  3  . carvedilol (COREG) 25 MG tablet TAKE ONE TABLET BY MOUTH TWICE DAILY  180 tablet  3  . digoxin (LANOXIN) 0.125 MG tablet Take 1 tablet (125 mcg total) by mouth daily.  90 tablet  3  . enalapril (VASOTEC) 10 MG tablet Take 0.5 tablets (5 mg total) by mouth daily.  90 tablet  3  . ezetimibe-simvastatin (VYTORIN) 10-40 MG per tablet Take 1 tablet by mouth at bedtime.  90 tablet  3  . warfarin (COUMADIN) 5 MG tablet Take as directed by Anticoagulation clinic.  30 tablet  3  . warfarin (COUMADIN) 5 MG tablet Take as directed by coumadin clinic  40 tablet  1    Past Medical History  Diagnosis Date  . Coronary artery disease   . A-fib   . PVD (peripheral vascular disease)   . Hyperlipidemia   . Tobacco user   . Chronic kidney disease, unspecified   . Acute myocardial infarction 03/15/01  . CHF (congestive heart failure)     class 4 congestive heart failure  . Mitral regurgitation     moderate;  with postinfarction class 4 unstable angina    Past Surgical History  Procedure Date  . Coronary artery bypass graft 03/15/01    x 5    ROS:  As stated in the HPI and negative for all other systems.  PHYSICAL EXAM BP 102/66  Pulse 60  Resp 16  Ht 6' (1.829 m)  Wt 180 lb 12.8 oz (82.01 kg)  BMI 24.52 kg/m2 GENERAL:  Well appearing HEENT:  Pupils equal round and  reactive, fundi not visualized, oral mucosa unremarkable NECK:  No jugular venous distention, waveform within normal limits, carotid upstroke brisk and symmetric, no bruits, no thyromegaly LUNGS:  Clear to auscultation bilaterally BACK:  No CVA tenderness CHEST:  Well healed sternotomy scar HEART:  PMI not displaced or sustained,S1 and S2 within normal limits, no S3, no S4, no clicks, no rubs, no murmurs, irregular ABD:  Flat, positive bowel sounds normal in frequency in pitch, no bruits, no rebound, no guarding, no midline pulsatile mass, no hepatomegaly, no splenomegaly EXT:  2 plus pulses throughout, no edema, no cyanosis no clubbing   EKG:  Atrial fibrillation, right bundle branch block, premature ectopic complexes, no acute ST-T wave change comment previous. 06/29/2012   ASSESSMENT AND PLAN

## 2012-06-30 ENCOUNTER — Other Ambulatory Visit: Payer: Self-pay | Admitting: Cardiology

## 2012-07-05 NOTE — Addendum Note (Signed)
Addended by: Micki Riley C on: 07/05/2012 01:43 PM   Modules accepted: Orders

## 2012-07-22 ENCOUNTER — Other Ambulatory Visit: Payer: Self-pay | Admitting: Cardiology

## 2012-07-23 NOTE — Telephone Encounter (Signed)
..   Requested Prescriptions   Pending Prescriptions Disp Refills  . enalapril (VASOTEC) 10 MG tablet [Pharmacy Med Name: ENALAPRIL 10MG       TAB] 30 tablet 6    Sig: TAKE ONE-HALF TABLET BY MOUTH EVERY DAY

## 2012-07-27 ENCOUNTER — Ambulatory Visit (INDEPENDENT_AMBULATORY_CARE_PROVIDER_SITE_OTHER): Payer: Medicare Other

## 2012-07-27 DIAGNOSIS — Z7901 Long term (current) use of anticoagulants: Secondary | ICD-10-CM

## 2012-07-27 DIAGNOSIS — I4891 Unspecified atrial fibrillation: Secondary | ICD-10-CM

## 2012-07-27 LAB — POCT INR: INR: 2.5

## 2012-08-07 ENCOUNTER — Other Ambulatory Visit: Payer: Self-pay | Admitting: Cardiology

## 2012-09-07 ENCOUNTER — Ambulatory Visit (INDEPENDENT_AMBULATORY_CARE_PROVIDER_SITE_OTHER): Payer: Medicare Other | Admitting: *Deleted

## 2012-09-07 DIAGNOSIS — Z7901 Long term (current) use of anticoagulants: Secondary | ICD-10-CM

## 2012-09-07 DIAGNOSIS — I4891 Unspecified atrial fibrillation: Secondary | ICD-10-CM

## 2012-09-07 LAB — POCT INR: INR: 2.9

## 2012-10-06 ENCOUNTER — Other Ambulatory Visit: Payer: Self-pay | Admitting: Cardiology

## 2012-10-08 NOTE — Telephone Encounter (Signed)
..   Requested Prescriptions   Pending Prescriptions Disp Refills  . VYTORIN 10-40 MG per tablet [Pharmacy Med Name: VYTORIN 10-40MG     TAB] 30 tablet 6    Sig: TAKE ONE TABLET BY MOUTH EVERY DAY   

## 2012-10-09 ENCOUNTER — Encounter (INDEPENDENT_AMBULATORY_CARE_PROVIDER_SITE_OTHER): Payer: Medicare Other

## 2012-10-09 DIAGNOSIS — I6529 Occlusion and stenosis of unspecified carotid artery: Secondary | ICD-10-CM

## 2012-10-11 ENCOUNTER — Other Ambulatory Visit (INDEPENDENT_AMBULATORY_CARE_PROVIDER_SITE_OTHER): Payer: Medicare Other

## 2012-10-11 ENCOUNTER — Other Ambulatory Visit: Payer: Self-pay | Admitting: *Deleted

## 2012-10-11 DIAGNOSIS — E785 Hyperlipidemia, unspecified: Secondary | ICD-10-CM

## 2012-10-11 LAB — LIPID PANEL: HDL: 51.7 mg/dL (ref >39.00)

## 2012-10-15 ENCOUNTER — Telehealth: Payer: Self-pay

## 2012-10-15 NOTE — Telephone Encounter (Signed)
Patient aware of lab results.

## 2012-10-15 NOTE — Telephone Encounter (Signed)
Message copied by Yolonda Kida on Mon Oct 15, 2012  3:56 PM ------      Message from: [name]      Created: Sun Oct 14, 2012  9:07 PM       Excellent result.  No change in therapy.  Call Mr. Pannone with results.

## 2012-10-19 ENCOUNTER — Ambulatory Visit (INDEPENDENT_AMBULATORY_CARE_PROVIDER_SITE_OTHER): Payer: Medicare Other

## 2012-10-19 DIAGNOSIS — Z7901 Long term (current) use of anticoagulants: Secondary | ICD-10-CM

## 2012-10-19 DIAGNOSIS — I4891 Unspecified atrial fibrillation: Secondary | ICD-10-CM

## 2012-10-19 LAB — POCT INR: INR: 2.6

## 2012-11-30 ENCOUNTER — Ambulatory Visit (INDEPENDENT_AMBULATORY_CARE_PROVIDER_SITE_OTHER): Payer: Medicare Other | Admitting: *Deleted

## 2012-11-30 DIAGNOSIS — I4891 Unspecified atrial fibrillation: Secondary | ICD-10-CM

## 2012-11-30 DIAGNOSIS — Z7901 Long term (current) use of anticoagulants: Secondary | ICD-10-CM

## 2012-11-30 LAB — POCT INR: INR: 2.9

## 2012-12-12 ENCOUNTER — Other Ambulatory Visit: Payer: Self-pay | Admitting: Otolaryngology

## 2012-12-14 ENCOUNTER — Telehealth: Payer: Self-pay | Admitting: Cardiology

## 2012-12-14 NOTE — Telephone Encounter (Signed)
Pt has had "nose surgery" and is no longer bleeding.  He was told he could re-start his warfarin when he stopped bleeding but needs to know what dose to take.  Will forward to Coumadin clinic to advise pt.

## 2012-12-14 NOTE — Telephone Encounter (Signed)
Talked with pt and instructed to restart coumadin as directed at last coumadin visit 5mg  every day except 2.5mg  on Tuesday and appt made for recheck on 12/272013. Pt states he is doing well no bleeding and verbalized understanding

## 2012-12-14 NOTE — Telephone Encounter (Signed)
plz return call to pt [phone] regarding medical care.

## 2012-12-18 NOTE — Progress Notes (Signed)
Received office notes from Dr. Dagoberto Reef @ [facility].

## 2012-12-21 ENCOUNTER — Telehealth: Payer: Self-pay | Admitting: Oncology

## 2012-12-21 ENCOUNTER — Ambulatory Visit (INDEPENDENT_AMBULATORY_CARE_PROVIDER_SITE_OTHER): Payer: Medicare Other | Admitting: *Deleted

## 2012-12-21 DIAGNOSIS — I4891 Unspecified atrial fibrillation: Secondary | ICD-10-CM

## 2012-12-21 DIAGNOSIS — Z7901 Long term (current) use of anticoagulants: Secondary | ICD-10-CM

## 2012-12-21 NOTE — Telephone Encounter (Signed)
C/D 12/21/12 for appt. 12/25/12

## 2012-12-21 NOTE — Progress Notes (Signed)
Received copy of Flow Cytometry Report; forwarded to Dr. Gaylyn Rong.

## 2012-12-21 NOTE — Telephone Encounter (Signed)
S/W PT IN REF TO NP APPT. ON 11/3112@11 :30 REFERRING DR CROSSLEY DX  DIFFUSE LARGE B-CELL LYMPHOMA DID NOT MAIL NP PACKET

## 2012-12-25 ENCOUNTER — Encounter: Payer: Self-pay | Admitting: Oncology

## 2012-12-25 ENCOUNTER — Other Ambulatory Visit (HOSPITAL_BASED_OUTPATIENT_CLINIC_OR_DEPARTMENT_OTHER): Payer: Medicare Other | Admitting: Lab

## 2012-12-25 ENCOUNTER — Ambulatory Visit (HOSPITAL_BASED_OUTPATIENT_CLINIC_OR_DEPARTMENT_OTHER): Payer: Medicare Other | Admitting: Oncology

## 2012-12-25 ENCOUNTER — Telehealth: Payer: Self-pay | Admitting: Oncology

## 2012-12-25 VITALS — BP 111/64 | HR 87 | Temp 97.5°F | Resp 20 | Ht 72.0 in | Wt 177.1 lb

## 2012-12-25 DIAGNOSIS — C8589 Other specified types of non-Hodgkin lymphoma, extranodal and solid organ sites: Secondary | ICD-10-CM

## 2012-12-25 DIAGNOSIS — C8330 Diffuse large B-cell lymphoma, unspecified site: Secondary | ICD-10-CM

## 2012-12-25 DIAGNOSIS — I509 Heart failure, unspecified: Secondary | ICD-10-CM

## 2012-12-25 DIAGNOSIS — N189 Chronic kidney disease, unspecified: Secondary | ICD-10-CM

## 2012-12-25 DIAGNOSIS — I4891 Unspecified atrial fibrillation: Secondary | ICD-10-CM

## 2012-12-25 LAB — CBC WITH DIFFERENTIAL/PLATELET
BASO%: 0.8 % (ref 0.0–2.0)
Basophils Absolute: 0.1 10*3/uL (ref 0.0–0.1)
EOS%: 1.3 % (ref 0.0–7.0)
HGB: 12.6 g/dL — ABNORMAL LOW (ref 13.0–17.1)
MCH: 30.3 pg (ref 27.2–33.4)
MCHC: 34 g/dL (ref 32.0–36.0)
MCV: 89.1 fL (ref 79.3–98.0)
MONO%: 7.8 % (ref 0.0–14.0)
NEUT%: 77.2 % — ABNORMAL HIGH (ref 39.0–75.0)
RDW: 13.7 % (ref 11.0–14.6)
lymph#: 1 10*3/uL (ref 0.9–3.3)

## 2012-12-25 LAB — COMPREHENSIVE METABOLIC PANEL (CC13)
AST: 20 U/L (ref 5–34)
CO2: 27 mEq/L (ref 22–29)
Chloride: 105 mEq/L (ref 98–107)
Glucose: 88 mg/dl (ref 70–99)
Potassium: 5.4 mEq/L — ABNORMAL HIGH (ref 3.5–5.1)
Sodium: 141 mEq/L (ref 136–145)
Total Bilirubin: 0.73 mg/dL (ref 0.20–1.20)

## 2012-12-25 MED ORDER — PROCHLORPERAZINE MALEATE 10 MG PO TABS: 10.0000 mg | ORAL_TABLET | Freq: Four times a day (QID) | ORAL | Status: DC | PRN

## 2012-12-25 MED ORDER — LIDOCAINE-PRILOCAINE 2.5-2.5 % EX CREA: TOPICAL_CREAM | CUTANEOUS | Status: DC | PRN

## 2012-12-25 MED ORDER — PREDNISONE 20 MG PO TABS: 60.0000 mg | ORAL_TABLET | Freq: Every day | ORAL | Status: DC

## 2012-12-25 MED ORDER — ONDANSETRON HCL 8 MG PO TABS: 8.0000 mg | ORAL_TABLET | Freq: Two times a day (BID) | ORAL | Status: DC

## 2012-12-25 NOTE — Patient Instructions (Addendum)
1.  Diagnosis:  Large B-cell Non Hodgkin lymphoma.  - Cause:  Unknown.   Lymphoma is not commonly known to be hereditary in most cases.  - Stage:  To be determined with further work up:  PET scan, bone marrow biopsy. - Prognosis:  To be determined with further work up. - Treatment:  Lymphoma is often not cured by surgery since it is often a systemic disease. Radiation if normally reserved for low grade, localized indolent lymphoma as upfront treatment or as consolidative therapy for bulky disease after chemotherapy.  - Chemotherapy:  R-CHOP chemo is given once every 3 weeks; with day #2 injection of Neulasta to decrease risk of infection.  This regimen contains:  Rituxan, Cytoxan, Adriamycin, Vincristine, Prednisone.  The first 4 agents are given intravenously on day 1 here at the [facility].  Prednisone pill is taken at home, in the morning, on days 1-5 (ONLY) of each chemo cycle.    - Potential side effects of R-CHOP which include but not limited to infusion reaction, rare seizure disorder, reactivated hepatitis, fatigue, low blood count, hair loss, mouth sore, infection, bleeding, irritated bladder lining with blood in urine, constipation, numbness and tingling of fingers and toes, secondary cancer, congestive heart failure.  - If cardiac function is significantly reduced on repeat echocardiogram, we may need to consider a different chemoregimen (either 50% dose reduction or Adriamycin, or replacing Adriamycin with Etoposide; or inpatient chemoregimen R-EPOCH).  - To assess response to chemo, a repeat scan will be obtained after a few cycles of chemo.  - Consideration of intrathecal chemo with the 2nd cycle of chemotherapy onward to decrease the risk of intracranial involvement of lymphoma in the future.   - In preparation for therapy:  * PET scan to complete staging work up.   * Bone marrow biopsy to rule out lymphoma in bone marrow space.   * Referral to 2-D echocardiogram to assess baseline  heart function.   * Referral to Interventional Radiology for Portacath placement.   * Referral to chemo class to learn about practical tips while on chemo.   * Pick up nausea medications from your preferred pharmacy.

## 2012-12-25 NOTE — Telephone Encounter (Signed)
Pt came to get scheduled and refused to let me schedule anything..his family will call back if he changes his mind.Gabriel KitchenMarland KitchenMarland Estrada

## 2012-12-25 NOTE — Progress Notes (Signed)
Left nasal polyp; since Nov 13.    [facility]  [phone] [phone]     INITIAL HEMATOLOGY CONSULTATION    Referral MD:  Dr. Keturah Barre, M.D.  Reason for Referral: newly diagnosed left nasal diffuse large B-cell lymphoma.     HPI: [name] is an 61 year man with afib, on Coumadin, CAD, CHF with last echo in 03/2012 with EF of 45%.  He has been in [facility] until he noticed a large left nasal polyp.  He was referred to Dr. Haroldine Laws who performed a CT scan on 12/03/12 at [facility] with report noticing marked asymmetrically enlarged soft tissue filling the left mid to lower nasal cavity without focal nodularity.  He underwent evaluation with Dr. Haroldine Laws on 12/12/2012 and had excision and resection of nasal polyp with resection of the inferior and middle turbinate.  Pathology case # [id number] was consistent with diffuse large B-cell Non Hodgkin's lymphoma (positive for CD20 and CD79A; negative for CD3, cytokeratin and melanA and S100).  He was kindly referred to the [facility] for evaluation.   Gabriel Estrada presented to the clinic for the first time today with his two sisters and a niece.  He reported feeling very well.  He denied any residual left nasal polyp, rhinorrhea, epistaxis, palpable nodes, fatigue, anorexia, weight loss, headache, visual changes.  He does have chronic knee pain and mild lower back pain which slow his gait.  However, he is still independent of all activities of daily living.   Patient denies fever, anorexia, weight loss, fatigue, headache, visual changes, confusion, drenching night sweats, palpable lymph node swelling, mucositis, odynophagia, dysphagia, nausea vomiting, jaundice, chest pain, palpitation, shortness of breath, dyspnea on exertion, productive cough, gum bleeding, epistaxis, hematemesis, hemoptysis, abdominal pain, abdominal swelling, early satiety, melena, hematochezia, hematuria, skin rash, spontaneous  bleeding, heat or cold intolerance, bowel bladder incontinence, focal motor weakness, paresthesia, depression, suicidal or homicidal ideation, feeling hopelessness.     Past Medical History  Diagnosis Date  . Coronary artery disease     x 1  . A-fib   . PVD (peripheral vascular disease)   . Hyperlipidemia   . Chronic kidney disease, unspecified   . Acute myocardial infarction 03/15/01  . CHF (congestive heart failure)     class 4 congestive heart failure  . Mitral regurgitation     moderate;  with postinfarction class 4 unstable angina  :    Past Surgical History  Procedure Date  . Coronary artery bypass graft 03/15/01    x 5  :   CURRENT MEDS: Current Outpatient Prescriptions  Medication Sig Dispense Refill  . carvedilol (COREG) 25 MG tablet Take 1 tablet (25 mg total) by mouth 2 (two) times daily with a meal.  180 tablet  3  . carvedilol (COREG) 25 MG tablet TAKE ONE TABLET BY MOUTH TWICE DAILY  180 tablet  3  . digoxin (LANOXIN) 0.125 MG tablet Take 1 tablet (125 mcg total) by mouth daily.  90 tablet  3  . enalapril (VASOTEC) 10 MG tablet Take 0.5 tablets (5 mg total) by mouth daily.  90 tablet  3  . enalapril (VASOTEC) 10 MG tablet TAKE ONE-HALF TABLET BY MOUTH EVERY DAY  30 tablet  6  . VYTORIN 10-40 MG per tablet TAKE ONE TABLET BY MOUTH EVERY DAY  30 tablet  6  . warfarin (COUMADIN) 5 MG tablet Take as directed by Anticoagulation clinic.  30 tablet  3  . warfarin (COUMADIN)  5 MG tablet TAKE AS DIRECTED BY COUMADIN CLINIC.  40 tablet  3  . lidocaine-prilocaine (EMLA) cream Apply topically as needed. Place over portacath site 1 hour before each chemo appointment.  30 g  2  . ondansetron (ZOFRAN) 8 MG tablet Take 1 tablet (8 mg total) by mouth 2 (two) times daily. Take two times a day starting the day after chemo for 3 days. Then take two times a day as needed for nausea or vomiting.  30 tablet  2  . predniSONE (DELTASONE) 20 MG tablet Take 3 tablets (60 mg total) by mouth  daily. Take on days 1-5 of chemotherapy.  90 tablet  0  . prochlorperazine (COMPAZINE) 10 MG tablet Take 1 tablet (10 mg total) by mouth every 6 (six) hours as needed (Nausea or vomiting).  30 tablet  6      No Known Allergies:  Family History  Problem Relation Age of Onset  . Heart attack Mother   . Heart disease Sister   . Heart attack Brother   . Heart attack Brother   . Heart disease Sister   . Heart disease Sister   . Cancer Father     lung?  . Cancer Maternal Aunt     kidney cancer  :  History   Social History  . Marital Status: Widowed    Spouse Name: N/A    Number of Children: 2  . Years of Education: N/A   Occupational History  . retired     Production designer, theatre/television/film    Social History Main Topics  . Smoking status: Former Smoker -- 1.0 packs/day for 20 years    Quit date: 12/26/2010  . Smokeless tobacco: Not on file  . Alcohol Use: No  . Drug Use: No  . Sexually Active:    Other Topics Concern  . Not on file   Social History Narrative  . No narrative on file  :  REVIEW OF SYSTEM:  The rest of the 14-point review of sytem was negative.   Exam: ECOG 1.   General:  well-nourished in no acute distress.  Eyes:  no scleral icterus.  ENT:  There were no oropharyngeal lesions.  I did not appreciate any left nasal mass, bleeding, erythema.   Neck was without thyromegaly.  Lymphatics:  Negative cervical, supraclavicular or axillary adenopathy.  Respiratory: lungs were clear bilaterally without wheezing or crackles.  Cardiovascular:  Irregularly irregular, S1/S2, without murmur, rub or gallop.  There was no pedal edema.  GI:  abdomen was soft, flat, nontender, nondistended, without organomegaly.  Muscoloskeletal:  no spinal tenderness of palpation of vertebral spine.  Skin exam was without echymosis, petichae.  Neuro exam was nonfocal.  Patient was able to get on and off exam table without assistance.  Gait was normal.  Patient was alerted and oriented.  Attention was  good.   Language was appropriate.  Mood was normal without depression.  Speech was not pressured.  Thought content was not tangential.    LABS:  Lab Results  Component Value Date   WBC 7.8 12/25/2012   HGB 12.6* 12/25/2012   HCT 37.1* 12/25/2012   PLT 181 12/25/2012   GLUCOSE 88 12/25/2012   CHOL 124 10/11/2012   TRIG 88.0 10/11/2012   HDL 51.70 10/11/2012   LDLCALC 55 10/11/2012   ALT 14 12/25/2012   AST 20 12/25/2012   NA 141 12/25/2012   K 5.4* 12/25/2012   CL 105 12/25/2012   CREATININE 2.0* 12/25/2012  BUN 41.0* 12/25/2012   CO2 27 12/25/2012   INR 1.6 12/21/2012     ASSESSMENT AND PLAN:   1.  Diagnosis:  Large B-cell Non Hodgkin lymphoma.  - Cause:  Unknown.   Lymphoma is not commonly known to be hereditary in most cases.  - Stage:  To be determined with further work up:  PET scan, bone marrow biopsy. - Prognosis:  To be determined with further work up.  I do have concern about his CKD and CHF.   - Treatment:  Lymphoma is often not cured by surgery since it is often a systemic disease. Radiation if normally reserved for low grade, localized indolent lymphoma as upfront treatment or as consolidative therapy for bulky disease after chemotherapy.  - Chemotherapy:  R-CHOP chemo is given once every 3 weeks; with day #2 injection of Neulasta to decrease risk of infection.  This regimen contains:  Rituxan, Cytoxan, Adriamycin, Vincristine, Prednisone.  The first 4 agents are given intravenously on day 1 here at the [facility].  Prednisone pill is taken at home, in the morning, on days 1-5 (ONLY) of each chemo cycle.    I would recommend mini R-CHOP as opposed to full dose R-CHOP due to his age, CKD and CHF.   - Potential side effects of R-CHOP which include but not limited to infusion reaction, rare seizure disorder, reactivated hepatitis, fatigue, low blood count, hair loss, mouth sore, infection, bleeding, irritated bladder lining with blood in urine, constipation, numbness  and tingling of fingers and toes, secondary cancer, congestive heart failure.  - If cardiac function is significantly reduced on repeat echocardiogram, we may need to consider a different chemoregimen (either 50% dose reduction or Adriamycin, or replacing Adriamycin with Etoposide; or inpatient chemoregimen R-EPOCH).  - To assess response to chemo, a repeat scan will be obtained after a few cycles of chemo.  - Consideration of intrathecal chemo with the 2nd cycle of chemotherapy onward to decrease the risk of intracranial involvement of lymphoma in the future.   - In preparation for therapy:  * PET scan to complete staging work up.   * Bone marrow biopsy to rule out lymphoma in bone marrow space.   * Referral to 2-D echocardiogram to assess baseline heart function.   * Referral to Interventional Radiology for Portacath placement.   * Referral to chemo class to learn about practical tips while on chemo.   * Pick up nausea medications from your preferred pharmacy.   He asked what happens if he does nothing.  There is a high chance of recurrence both local and systemically without systemic chemo.  He said that he would think about it before undergoing work up.          The length of time of the face-to-face encounter was 60 minutes. More than 50% of time was spent counseling and coordination of care.     Thank you for this referral.

## 2013-01-02 ENCOUNTER — Other Ambulatory Visit (HOSPITAL_COMMUNITY): Payer: Medicare Other

## 2013-01-04 ENCOUNTER — Ambulatory Visit: Payer: Medicare Other

## 2013-01-04 ENCOUNTER — Ambulatory Visit: Payer: Medicare Other | Admitting: Radiation Oncology

## 2013-01-07 ENCOUNTER — Other Ambulatory Visit: Payer: Self-pay | Admitting: Oncology

## 2013-01-08 ENCOUNTER — Ambulatory Visit (INDEPENDENT_AMBULATORY_CARE_PROVIDER_SITE_OTHER): Payer: Medicare Other

## 2013-01-08 ENCOUNTER — Ambulatory Visit (INDEPENDENT_AMBULATORY_CARE_PROVIDER_SITE_OTHER): Payer: Medicare Other | Admitting: Cardiology

## 2013-01-08 ENCOUNTER — Encounter: Payer: Self-pay | Admitting: Cardiology

## 2013-01-08 VITALS — BP 131/73 | HR 86 | Ht 72.0 in | Wt 175.0 lb

## 2013-01-08 DIAGNOSIS — I2510 Atherosclerotic heart disease of native coronary artery without angina pectoris: Secondary | ICD-10-CM

## 2013-01-08 DIAGNOSIS — I4891 Unspecified atrial fibrillation: Secondary | ICD-10-CM

## 2013-01-08 DIAGNOSIS — N189 Chronic kidney disease, unspecified: Secondary | ICD-10-CM

## 2013-01-08 DIAGNOSIS — Z7901 Long term (current) use of anticoagulants: Secondary | ICD-10-CM

## 2013-01-08 DIAGNOSIS — I6529 Occlusion and stenosis of unspecified carotid artery: Secondary | ICD-10-CM

## 2013-01-08 LAB — POCT INR: INR: 2.8

## 2013-01-08 MED ORDER — CARVEDILOL 25 MG PO TABS: 25.0000 mg | ORAL_TABLET | Freq: Two times a day (BID) | ORAL | Status: DC

## 2013-01-08 MED ORDER — DIGOXIN 125 MCG PO TABS: 125.0000 ug | ORAL_TABLET | Freq: Every day | ORAL | Status: DC

## 2013-01-08 MED ORDER — ENALAPRIL MALEATE 10 MG PO TABS: 5.0000 mg | ORAL_TABLET | Freq: Every day | ORAL | Status: DC

## 2013-01-08 NOTE — Progress Notes (Signed)
   HPI The patient returns for followup of his atrial fibrillation and CAD.  Since I last saw him he was found to have a nasal polyp which was diagnosed as lymphoma. He has chosen not to have chemotherapy however. He thinks his feeling well.  The patient denies any new symptoms such as chest discomfort, neck or arm discomfort. There has been no new shortness of breath, PND or orthopnea. There have been no reported palpitations, presyncope or syncope.  He doesn't notice his atrial fibrillation. He's had no bleeding issues or other problems with his warfarin.  NKDA  Current Outpatient Prescriptions  Medication Sig Dispense Refill  . carvedilol (COREG) 25 MG tablet TAKE ONE TABLET BY MOUTH TWICE DAILY  180 tablet  3  . digoxin (LANOXIN) 0.125 MG tablet Take 1 tablet (125 mcg total) by mouth daily.  90 tablet  3  . enalapril (VASOTEC) 10 MG tablet TAKE ONE-HALF TABLET BY MOUTH EVERY DAY  30 tablet  6  . VYTORIN 10-40 MG per tablet TAKE ONE TABLET BY MOUTH EVERY DAY  30 tablet  6  . warfarin (COUMADIN) 5 MG tablet TAKE AS DIRECTED BY COUMADIN CLINIC.  40 tablet  3    Past Medical History  Diagnosis Date  . Coronary artery disease 03/15/2001    x 1 with MI  . A-fib   . PVD (peripheral vascular disease)   . Hyperlipidemia   . Chronic kidney disease, unspecified   . CHF (congestive heart failure)     class 4 congestive heart failure  . Mitral regurgitation     moderate;  with postinfarction class 4 unstable angina    Past Surgical History  Procedure Date  . Coronary artery bypass graft 03/15/01    x 5    ROS:  As stated in the HPI and negative for all other systems.  PHYSICAL EXAM BP 131/73  Pulse 86  Ht 6' (1.829 m)  Wt 175 lb (79.379 kg)  BMI 23.73 kg/m2 GENERAL:  Well appearing HEENT:  Pupils equal round and reactive, fundi not visualized, oral mucosa unremarkable NECK:  No jugular venous distention, waveform within normal limits, carotid upstroke brisk and symmetric, no bruits,  no thyromegaly LUNGS:  Clear to auscultation bilaterally BACK:  No CVA tenderness CHEST:  Well healed sternotomy scar HEART:  PMI not displaced or sustained,S1 and S2 within normal limits, no S3, no S4, no clicks, no rubs, no murmurs, irregular ABD:  Flat, positive bowel sounds normal in frequency in pitch, no bruits, no rebound, no guarding, no midline pulsatile mass, no hepatomegaly, no splenomegaly EXT:  2 plus pulses throughout, no edema, no cyanosis no clubbing   EKG:  Atrial fibrillation, right bundle branch block, no acute ST-T wave change comment previous. 01/08/2013   ASSESSMENT AND PLAN  ATRIAL FIBRILLATION -  The patient tolerates this rhythm and rate control and anticoagulation. We will continue with the meds as listed.   CAD -  The patient has no new sypmtoms. No further cardiovascular testing is indicated. We will continue with aggressive risk reduction and meds as listed.   CAROTID ARTERY DISEASE -  He had very mild plaque when checked recently. I will follow this up clinically.  DYSLIPIDEMIA -  Given the fact that he's been so stable for so long and doing well I will make any changes to his meds.

## 2013-01-08 NOTE — Patient Instructions (Addendum)
The current medical regimen is effective;  continue present plan and medications.  Follow up in 6 months with Dr Hochrein.  You will receive a letter in the mail 2 months before you are due.  Please call us when you receive this letter to schedule your follow up appointment.  

## 2013-02-05 ENCOUNTER — Ambulatory Visit (INDEPENDENT_AMBULATORY_CARE_PROVIDER_SITE_OTHER): Payer: Medicare Other | Admitting: *Deleted

## 2013-02-05 DIAGNOSIS — I4891 Unspecified atrial fibrillation: Secondary | ICD-10-CM

## 2013-02-05 DIAGNOSIS — Z7901 Long term (current) use of anticoagulants: Secondary | ICD-10-CM

## 2013-02-10 ENCOUNTER — Other Ambulatory Visit: Payer: Self-pay | Admitting: Cardiology

## 2013-03-08 ENCOUNTER — Ambulatory Visit (INDEPENDENT_AMBULATORY_CARE_PROVIDER_SITE_OTHER): Payer: Medicare Other | Admitting: *Deleted

## 2013-03-08 DIAGNOSIS — I4891 Unspecified atrial fibrillation: Secondary | ICD-10-CM

## 2013-03-20 ENCOUNTER — Encounter: Payer: Self-pay | Admitting: Cardiology

## 2013-03-22 ENCOUNTER — Other Ambulatory Visit: Payer: Self-pay | Admitting: Oncology

## 2013-04-05 ENCOUNTER — Ambulatory Visit (INDEPENDENT_AMBULATORY_CARE_PROVIDER_SITE_OTHER): Payer: Medicare Other | Admitting: *Deleted

## 2013-04-05 DIAGNOSIS — Z7901 Long term (current) use of anticoagulants: Secondary | ICD-10-CM

## 2013-04-05 DIAGNOSIS — I4891 Unspecified atrial fibrillation: Secondary | ICD-10-CM

## 2013-04-05 LAB — POCT INR: INR: 2.4

## 2013-05-03 ENCOUNTER — Ambulatory Visit (INDEPENDENT_AMBULATORY_CARE_PROVIDER_SITE_OTHER): Payer: Medicare Other

## 2013-05-03 DIAGNOSIS — I4891 Unspecified atrial fibrillation: Secondary | ICD-10-CM

## 2013-05-03 DIAGNOSIS — Z7901 Long term (current) use of anticoagulants: Secondary | ICD-10-CM

## 2013-05-09 ENCOUNTER — Other Ambulatory Visit: Payer: Self-pay | Admitting: Cardiology

## 2013-05-09 NOTE — Telephone Encounter (Signed)
..   Requested Prescriptions   Pending Prescriptions Disp Refills  . VYTORIN 10-40 MG per tablet [Pharmacy Med Name: VYTORIN 10-40MG      TAB] 30 tablet 6    Sig: TAKE ONE TABLET BY MOUTH EVERY DAY

## 2013-05-17 ENCOUNTER — Encounter: Payer: Self-pay | Admitting: *Deleted

## 2013-05-17 NOTE — Progress Notes (Signed)
Lymphoma Location(s) / Histology:Nasal Mucosa.  Large B-Cell Lymphoma  Patient presented 5 months ago with symptoms of: Nasal Polyp   12 /  18/2013  Biopsies of Nasal Mucosa and Left nasal mass  (if applicable) revealed:   1. Nasal mucosa, biopsy, left LARGE B CELL LYMPHOMA 2. Soft tissue mass, biopsy, left nasal mass LARGE B CELL LYMPHOMA. Microscopic Comment   Past/Anticipated interventions by medical oncology, if any: Refused Chemotherapy/ Radiation Therapy 5 months ago.  Receptive to Radiation Therapy as of 05/01/13   Weight changes, if any, over the past 6 months: No  Recurrent fevers, or drenching night sweats, if any: No  SAFETY ISSUES:  Prior radiation? No  Pacemaker/ICD? No  Possible current pregnancy? No  Is the patient on methotrexate? No  Current Complaints / other details: Note swelling left nares and he c/o sensation of "stopped up" nose

## 2013-05-21 ENCOUNTER — Ambulatory Visit
Admission: RE | Admit: 2013-05-21 | Discharge: 2013-05-21 | Disposition: A | Discharge status: Home / Self Care | Payer: Medicare Other | Source: Ambulatory Visit | Attending: Radiation Oncology | Admitting: Radiation Oncology

## 2013-05-21 ENCOUNTER — Encounter: Payer: Self-pay | Admitting: Radiation Oncology

## 2013-05-21 VITALS — BP 146/71 | HR 77 | Temp 97.7°F | Ht 72.0 in | Wt 174.2 lb

## 2013-05-21 DIAGNOSIS — C8589 Other specified types of non-Hodgkin lymphoma, extranodal and solid organ sites: Secondary | ICD-10-CM

## 2013-05-21 DIAGNOSIS — Z7901 Long term (current) use of anticoagulants: Secondary | ICD-10-CM | POA: Insufficient documentation

## 2013-05-21 DIAGNOSIS — I739 Peripheral vascular disease, unspecified: Secondary | ICD-10-CM | POA: Insufficient documentation

## 2013-05-21 DIAGNOSIS — I2510 Atherosclerotic heart disease of native coronary artery without angina pectoris: Secondary | ICD-10-CM | POA: Insufficient documentation

## 2013-05-21 DIAGNOSIS — E785 Hyperlipidemia, unspecified: Secondary | ICD-10-CM | POA: Insufficient documentation

## 2013-05-21 DIAGNOSIS — I509 Heart failure, unspecified: Secondary | ICD-10-CM | POA: Insufficient documentation

## 2013-05-21 DIAGNOSIS — Z79899 Other long term (current) drug therapy: Secondary | ICD-10-CM | POA: Insufficient documentation

## 2013-05-21 DIAGNOSIS — Z951 Presence of aortocoronary bypass graft: Secondary | ICD-10-CM | POA: Insufficient documentation

## 2013-05-21 NOTE — Progress Notes (Signed)
Radiation Oncology         [phone] ________________________________  Initial outpatient Consultation  Name: Gabriel Estrada MRN: [id number]  Date: 05/21/2013  DOB: November 11, 1928  WU:[id number] Hochrein, MD  Keturah Barre, MD   REFERRING PHYSICIAN: Keturah Barre, MD  DIAGNOSIS: Diffuse large B cell Non Hodgkin's Lymphoma, left nasal cavity.  HISTORY OF PRESENT ILLNESS::Gabriel Estrada is a 79 y.o. male who noted until he noticed a large left nasal polyp late last year. He was referred to Dr. Haroldine Laws and underwent a CT scan on 12/03/12 at [facility] with a marked asymmetrically enlarged soft tissue filling the left mid to lower nasal cavity. On 12/12/2012 and had excision and resection of nasal polyp with resection of the inferior and middle turbinate. Pathology was consistent with diffuse large B-cell Non Hodgkin's lymphoma.  He was then referred to see me and medical oncology.  He met Dr. Gaylyn Rong on 12/25/12. Dr. Gaylyn Rong recommended staging/cardiac studies and eventual systemic therapy, possibly R-CHOP vs other agents depending on heart studies.   The patient decided not to pursue therapy and cancelled his consultation with me.    In the interim, he has felt relatively well. He reports swelling left nares and he c/o sensation of "stopped up" nose. This is stable to improved since excision last year.    CT on 05/01/13 showed internal diminished size, but incompletely resolved left nasal cavity mass with osseous erosion.  Dr. Haroldine Laws spoke w/ the patient about persistent and /or recurrent disease, recommending he see me. Patient is now amenable to considered treatment.  He denies h/o night sweats, fevers, weight loss, pain, lumps or bumps in body.  PREVIOUS RADIATION THERAPY: No  PAST MEDICAL HISTORY:  has a past medical history of Coronary artery disease (03/15/2001); A-fib; PVD (peripheral vascular disease); Hyperlipidemia; Chronic kidney disease, unspecified; CHF (congestive heart failure); and Mitral  regurgitation.    PAST SURGICAL HISTORY: Past Surgical History  Procedure Laterality Date  . Coronary artery bypass graft  03/15/01    x 5    FAMILY HISTORY: family history includes Cancer in his father and maternal aunt; Heart attack in his brothers and mother; and Heart disease in his sisters.  SOCIAL HISTORY:  reports that he quit smoking about 2 years ago. He does not have any smokeless tobacco history on file. He reports that he does not drink alcohol or use illicit drugs.  ALLERGIES: Review of patient's allergies indicates no known allergies.  MEDICATIONS:  Current Outpatient Prescriptions  Medication Sig Dispense Refill  . carvedilol (COREG) 25 MG tablet Take 1 tablet (25 mg total) by mouth 2 (two) times daily with a meal.  180 tablet  3  . digoxin (LANOXIN) 0.125 MG tablet Take 1 tablet (125 mcg total) by mouth daily.  90 tablet  3  . enalapril (VASOTEC) 10 MG tablet Take 0.5 tablets (5 mg total) by mouth daily.  45 tablet  3  . VYTORIN 10-40 MG per tablet TAKE ONE TABLET BY MOUTH EVERY DAY  30 tablet  6  . warfarin (COUMADIN) 5 MG tablet TAKE AS DIRECTED BY COUMADIN CLINIC.  40 tablet  3   No current facility-administered medications for this encounter.    REVIEW OF SYSTEMS:     Pertinent items are noted in HPI.   PHYSICAL EXAM:  height is 6' (1.829 m) and weight is 174 lb 3.2 oz (79.017 kg). His temperature is 97.7 F (36.5 C). His blood pressure is 136/105 and his pulse is 72. His oxygen  saturation is 97%.   General: Alert and oriented, in no acute distress HEENT:  Pupils are equally round and reactive to light. Extraocular movements are intact. Oropharynx is notable for poor dentition, many missing from maxillary region.  No oropharyngeal lesions otherwise. Swelling/erythema of left nares.  Some external swelling as well. Right nares at midline is erythematous but no significant swelling. No eye irritation. Neck: Neck is supple, no palpable cervical or supraclavicular  lymphadenopathy. Heart: Regular in rate and rhythm with no murmurs, rubs, or gallops. Chest: Clear to auscultation bilaterally, with no rhonchi, wheezes, or rales. Abdomen: Soft, nontender, nondistended, with no rigidity or guarding. Extremities: No cyanosis or edema. Lymphatics: No concerning lymphadenopathy in neck. Skin: No concerning lesions.  Musculoskeletal: symmetric strength and muscle tone throughout. Neurologic: Cranial nerves II through XII are grossly intact. No obvious focalities. Speech is fluent. Coordination is intact. Psychiatric: Judgment and insight are intact. Affect is appropriate.   LABORATORY DATA:  Lab Results  Component Value Date   WBC 7.8 12/25/2012   HGB 12.6* 12/25/2012   HCT 37.1* 12/25/2012   MCV 89.1 12/25/2012   PLT 181 12/25/2012   CMP     Component Value Date/Time   NA 141 12/25/2012 1202   NA 140 12/28/2010 1001   K 5.4* 12/25/2012 1202   K 5.1 12/28/2010 1001   CL 105 12/25/2012 1202   CL 104 12/28/2010 1001   CO2 27 12/25/2012 1202   CO2 29 12/28/2010 1001   GLUCOSE 88 12/25/2012 1202   GLUCOSE 74 12/28/2010 1001   BUN 41.0* 12/25/2012 1202   BUN 26* 12/28/2010 1001   CREATININE 2.0* 12/25/2012 1202   CREATININE 1.6* 12/28/2010 1001   CALCIUM 9.3 12/25/2012 1202   CALCIUM 9.5 12/28/2010 1001   PROT 6.8 12/25/2012 1202   PROT 6.7 12/06/2010 0807   ALBUMIN 3.6 12/25/2012 1202   ALBUMIN 4.1 12/06/2010 0807   AST 20 12/25/2012 1202   AST 22 12/06/2010 0807   ALT 14 12/25/2012 1202   ALT 16 12/06/2010 0807   ALKPHOS 75 12/25/2012 1202   ALKPHOS 55 12/06/2010 0807   BILITOT 0.73 12/25/2012 1202   BILITOT 0.8 12/06/2010 0807   GFRNONAA 47.10* 12/24/2010 1257         RADIOGRAPHY: looked at CD for May CT images - will get this digitized.    IMPRESSION/PLAN: [age] yo man with at least Stage IA NHL, left nasal cavity. Staging incomplete.  I talked with him about the importance of systemic therapy for chance of cure. We had a lengthy talk  about this, in fact. He is amenable to meet again with med/onc and seems open to chemotherapy, now.  He is enthusiastic about teeth extractions regardless of whether he received radiotherapy. Will refer to Dr. Kristin Bruins.  We spoke about the value of radiotherapy for local control, but not systemic coverage. He understands RT alone would be a palliative "band aid" approach to his disease.  He will meet with med/onc first, and then be referred back to me when he is done with systemic treatment (or immediately if he refuses chemotherapy once more).      Patient and his sister were very appreciative of our time together and seem to be leaning toward serious consideration of systemic therapy. They understand that I will be on maternity leave in early November; if I am on maternity leave by the time he is ready for radiotherapy, one of my partners will see the patient. They're comfortable with this plan.  I spent 45 minutes minutes face to face with the patient and more than 50% of that time was spent in counseling and/or coordination of care.    __________________________________________   Lonie Peak, MD

## 2013-05-22 ENCOUNTER — Telehealth: Payer: Self-pay | Admitting: Oncology

## 2013-05-22 ENCOUNTER — Other Ambulatory Visit: Payer: Self-pay | Admitting: Oncology

## 2013-05-22 DIAGNOSIS — C8589 Other specified types of non-Hodgkin lymphoma, extranodal and solid organ sites: Secondary | ICD-10-CM

## 2013-05-22 NOTE — Telephone Encounter (Signed)
s.w. pt and advised on 6.4.14 appt....pt ok and awre

## 2013-05-22 NOTE — Addendum Note (Signed)
Encounter addended by: Delynn Flavin, RN on: 05/22/2013  9:51 AM<BR>     Documentation filed: Charges VN

## 2013-05-23 ENCOUNTER — Encounter (HOSPITAL_COMMUNITY): Payer: Self-pay | Admitting: Pharmacy Technician

## 2013-05-23 ENCOUNTER — Encounter (HOSPITAL_COMMUNITY): Payer: Self-pay | Admitting: Dentistry

## 2013-05-23 ENCOUNTER — Ambulatory Visit (HOSPITAL_COMMUNITY): Payer: Self-pay | Admitting: Dentistry

## 2013-05-23 ENCOUNTER — Other Ambulatory Visit (HOSPITAL_COMMUNITY): Payer: Self-pay | Admitting: Dentistry

## 2013-05-23 VITALS — BP 110/77 | HR 77 | Temp 97.6°F

## 2013-05-23 DIAGNOSIS — Z7901 Long term (current) use of anticoagulants: Secondary | ICD-10-CM

## 2013-05-23 DIAGNOSIS — K0530 Chronic periodontitis, unspecified: Secondary | ICD-10-CM

## 2013-05-23 DIAGNOSIS — K08109 Complete loss of teeth, unspecified cause, unspecified class: Secondary | ICD-10-CM

## 2013-05-23 DIAGNOSIS — Z0189 Encounter for other specified special examinations: Secondary | ICD-10-CM

## 2013-05-23 DIAGNOSIS — K083 Retained dental root: Secondary | ICD-10-CM

## 2013-05-23 DIAGNOSIS — M264 Malocclusion, unspecified: Secondary | ICD-10-CM

## 2013-05-23 DIAGNOSIS — K036 Deposits [accretions] on teeth: Secondary | ICD-10-CM

## 2013-05-23 DIAGNOSIS — K045 Chronic apical periodontitis: Secondary | ICD-10-CM

## 2013-05-23 DIAGNOSIS — M278 Other specified diseases of jaws: Secondary | ICD-10-CM

## 2013-05-23 DIAGNOSIS — M270 Developmental disorders of jaws: Secondary | ICD-10-CM

## 2013-05-23 DIAGNOSIS — C8589 Other specified types of non-Hodgkin lymphoma, extranodal and solid organ sites: Secondary | ICD-10-CM

## 2013-05-23 DIAGNOSIS — K029 Dental caries, unspecified: Secondary | ICD-10-CM

## 2013-05-23 NOTE — Progress Notes (Signed)
DENTAL CONSULTATION  Date of Consultation:  05/23/2013 Patient Name:   Gabriel Estrada Date of Birth:   July 09, 1928 Medical Record Number: [id number]  VITALS: BP 110/77  Pulse 77  Temp(Src) 97.6 F (36.4 C) (Oral)   CHIEF COMPLAINT:  The patient was referred by Dr. Basilio Cairo for a medically necessary pre-chemoradiation therapy dental protocol consultation.    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 Patient with anticipated chemoradiation therapy. Patient is now seen as part of a medically necessary pre-chemoradiation therapy dental protocol consultation.  The patient currently denies acute toothache, swellings, or abscesses. Patient knows, however, that he has "many bad teeth" that "need to come out". Patient was last seen by a dentist in [place] approximately 15 years ago. Patient has no lower partial denture. Patient did have a maxillary partial denture that has not been worn since 2002.    Patient Active Problem List   Diagnosis Date Noted  . Other malignant lymphomas, unspecified site, extranodal and solid organ sites 12/25/2012  . Other malignant lymphomas, unspecified site, extranodal and solid organ sites 12/25/2012  . Atrial fib/flutter, transient 01/06/2012  . Long term current use of anticoagulant 12/05/2011  . MIXED HYPERLIPIDEMIA 12/06/2010  . CAROTID ARTERY DISEASE 10/21/2010  . DYSLIPIDEMIA 11/23/2009  . TOBACCO USER 11/18/2009  . CAD 11/18/2009  . ATRIAL FIBRILLATION 11/18/2009  . PVD 11/18/2009  . RENAL INSUFFICIENCY, CHRONIC 11/18/2009   PMH: Past Medical History  Diagnosis Date  . Coronary artery disease 03/15/2001    x 1 with MI  . A-fib   . PVD (peripheral vascular disease)   . Hyperlipidemia   . Chronic kidney disease, unspecified   . CHF (congestive heart failure)     class 4 congestive heart failure  . Mitral regurgitation     moderate;  with postinfarction class 4 unstable  angina    PSH: Past Surgical History  Procedure Laterality Date  . Coronary artery bypass graft  03/15/01    x 5    ALLERGIES: No Known Allergies  MEDICATIONS: Current Outpatient Prescriptions  Medication Sig Dispense Refill  . carvedilol (COREG) 25 MG tablet Take 1 tablet (25 mg total) by mouth 2 (two) times daily with a meal.  180 tablet  3  . digoxin (LANOXIN) 0.125 MG tablet Take 1 tablet (125 mcg total) by mouth daily.  90 tablet  3  . enalapril (VASOTEC) 10 MG tablet Take 0.5 tablets (5 mg total) by mouth daily.  45 tablet  3  . VYTORIN 10-40 MG per tablet TAKE ONE TABLET BY MOUTH EVERY DAY  30 tablet  6  . warfarin (COUMADIN) 5 MG tablet TAKE AS DIRECTED BY COUMADIN CLINIC.  40 tablet  3   No current facility-administered medications for this visit.    LABS: Lab Results  Component Value Date   WBC 7.8 12/25/2012   HGB 12.6* 12/25/2012   HCT 37.1* 12/25/2012   MCV 89.1 12/25/2012   PLT 181 12/25/2012      Component Value Date/Time   NA 141 12/25/2012 1202   NA 140 12/28/2010 1001   K 5.4* 12/25/2012 1202   K 5.1 12/28/2010 1001   CL 105 12/25/2012 1202   CL 104 12/28/2010 1001   CO2 27 12/25/2012 1202   CO2 29 12/28/2010 1001   GLUCOSE 88 12/25/2012 1202   GLUCOSE 74 12/28/2010 1001   BUN 41.0* 12/25/2012 1202   BUN 26* 12/28/2010 1001   CREATININE  2.0* 12/25/2012 1202   CREATININE 1.6* 12/28/2010 1001   CALCIUM 9.3 12/25/2012 1202   CALCIUM 9.5 12/28/2010 1001   GFRNONAA 47.10* 12/24/2010 1257   Lab Results  Component Value Date   INR 2.6 05/03/2013   INR 2.4 04/05/2013   INR 3.1 03/08/2013   No results found for this basename: PTT    SOCIAL HISTORY: History   Social History  . Marital Status: Widowed    Spouse Name: N/A    Number of Children: 2  . Years of Education: N/A   Occupational History  . retired     Production designer, theatre/television/film    Social History Main Topics  . Smoking status: Former Smoker -- 1.00 packs/day for 20 years    Quit date: 12/26/2010  .  Smokeless tobacco: Never Used  . Alcohol Use: No     Comment: Used to drink beer, but not anymore.  . Drug Use: No  . Sexually Active: Not on file   Other Topics Concern  . Not on file   Social History Narrative  . No narrative on file    FAMILY HISTORY: Family History  Problem Relation Age of Onset  . Heart attack Mother   . Heart disease Sister   . Heart attack Brother   . Heart attack Brother   . Heart disease Sister   . Heart disease Sister   . Cancer Father     lung?  . Cancer Maternal Aunt     kidney cancer     REVIEW OF SYSTEMS: Reviewed with the patient and included in the dental record.  DENTAL HISTORY: CHIEF COMPLAINT:  The patient was referred by Dr. Basilio Cairo for a medically necessary pre-chemoradiation therapy dental protocol consultation.    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 Patient with anticipated chemoradiation therapy. Patient is now seen as part of a medically necessary pre-chemoradiation therapy dental protocol consultation.  The patient currently denies acute toothache, swellings, or abscesses. Patient knows, however, that he has "many bad teeth" that "need to come out". Patient was last seen by a dentist in [place] approximately 15 years ago. Patient has no lower partial denture. Patient did have a maxillary partial denture that has not been worn since 2002.     DENTAL EXAMINATION:  GENERAL: Patient is a well-developed, well-nourished male in no acute distress. HEAD AND NECK: There is no palpable lymphadenopathy. The patient denies acute TMJ symptoms. INTRAORAL EXAM: The patient has normal saliva. I do not see any evidence of abscess formation. The patient has bilateral mandibular lingual tori.  Patient also has a buccal exostoses in the area of tooth numbers 1 through 3. DENTITION: The patient is missing tooth numbers 1, 3, 4, 5, 9, 13, 14, 15, 16, 17, 19, 30, 31, and  32. There are retained roots in the area of tooth numbers 2, 18, and 20. PERIODONTAL: The patient has chronic periodontitis disease with plaque and calculus accumulations, generalized gingival recession and tooth mobility as charted. DENTAL CARIES/SUBOPTIMAL RESTORATIONS: There are multiple dental caries noted as per dental charting form. ENDODONTIC: Patient currently denies acute pulpitis symptoms. Patient does have periapical pathology and radiolucency associated with the apices of tooth numbers 2 and 18. CROWN AND BRIDGE: There are no crown or bridge restorations. PROSTHODONTIC: The patient currently does not wear partial dentures. The patient had a maxillary partial denture that he has not worn since 2002.  OCCLUSION: The patient has a  poor occlusal scheme secondary to multiple missing teeth, multiple retained root segments,  deep overbite, supra-eruption and drifting of the unopposed teeth into the edentulous areas, and lack of replacement of the missing teeth with dental prostheses.  RADIOGRAPHIC INTERPRETATION: An orthopantogram was obtained and supplemented with periapical radiographs. There are multiple missing teeth. There are retained root segments. There multiple areas of periapical pathology and radiolucency associated with retained roots numbers 2 and 18. Dental caries are noted. There is supra-eruption and drifting of the unopposed teeth into the edentulous areas. There is moderate to severe bone loss.   ASSESSMENTS: 1. Chronic periodontitis with bone loss 2. Generalized gingival recession 3. Plaque and calculus accumulations 4. Tooth mobility 5. Dental caries 6. Bilateral mandibular lingual tori 7. Maxillary right buccal exostoses 6. Multiple missing teeth 7. Multiple retained root segments 8. Chronic apical periodontitis affecting tooth numbers 2 and 18. 9. Supra-eruption and drifting of the unopposed teeth into the edentulous areas 10. Poor occlusal scheme and  malocclusion 11. Coumadin therapy with risk for bleeding with invasive dental procedures 12. Significant medical comorbidities the potential for complications up to and including death with anticipated invasive dental procedures in the operating room with general anesthesia.   PLAN/RECOMMENDATIONS: 1. I discussed the risks, benefits, and complications of various treatment options with the patient in relationship to his medical and dental conditions, anticipated chemoradiation therapy, and chemoradiation therapy side effects to include xerostomia, radiation caries, trismus, mucositis, taste changes, gum and jawbone changes, and risk for infection and osteoradionecrosis. We discussed various treatment options to include no treatment, multiple extractions with alveoloplasty, pre-prosthetic surgery as indicated, periodontal therapy, dental restorations, root canal therapy, crown and bridge therapy, implant therapy, and replacement of missing teeth as indicated. The patient currently wishes to proceed with   extraction of remaining teeth with alveoloplasty and pre-prosthetic surgery as indicated in the operating room with general anesthesia.  This is been scheduled for 05/30/2013 at 7:30 AM at [facility] operating room.  Patient will then followup with the dentist of his choice for fabrication of upper and lower complete dentures after adequate healing and approximately 3 months after the last radiation therapy has been completed.  Dr. Antoine Poche has been contacted and agrees to discontinuing the warfarin therapy 5 days prior to the surgery and then will restart warfarin therapy after the dental procedures as indicated.  2. Discussion of findings with medical team and coordination of future medical and dental care as indicated.   Charlynne Pander, DDS

## 2013-05-23 NOTE — Patient Instructions (Addendum)

## 2013-05-24 ENCOUNTER — Other Ambulatory Visit: Payer: Self-pay | Admitting: Radiation Oncology

## 2013-05-24 ENCOUNTER — Inpatient Hospital Stay
Admission: RE | Admit: 2013-05-24 | Discharge: 2013-05-24 | Disposition: A | Discharge status: Home / Self Care | Payer: Self-pay | Source: Ambulatory Visit | Attending: Radiation Oncology | Admitting: Radiation Oncology

## 2013-05-24 DIAGNOSIS — C8590 Non-Hodgkin lymphoma, unspecified, unspecified site: Secondary | ICD-10-CM

## 2013-05-28 ENCOUNTER — Other Ambulatory Visit (HOSPITAL_COMMUNITY): Payer: Self-pay

## 2013-05-29 ENCOUNTER — Telehealth: Payer: Self-pay | Admitting: Cardiology

## 2013-05-29 ENCOUNTER — Encounter (HOSPITAL_COMMUNITY): Payer: Self-pay

## 2013-05-29 ENCOUNTER — Telehealth: Payer: Self-pay | Admitting: Oncology

## 2013-05-29 ENCOUNTER — Ambulatory Visit (HOSPITAL_BASED_OUTPATIENT_CLINIC_OR_DEPARTMENT_OTHER): Payer: Medicare Other | Admitting: Oncology

## 2013-05-29 ENCOUNTER — Ambulatory Visit (HOSPITAL_COMMUNITY)
Admission: RE | Admit: 2013-05-29 | Discharge: 2013-05-29 | Disposition: A | Discharge status: Home / Self Care | Payer: Medicare Other | Source: Ambulatory Visit | Attending: Dentistry | Admitting: Dentistry

## 2013-05-29 ENCOUNTER — Other Ambulatory Visit (HOSPITAL_BASED_OUTPATIENT_CLINIC_OR_DEPARTMENT_OTHER): Payer: Medicare Other | Admitting: Lab

## 2013-05-29 ENCOUNTER — Encounter (HOSPITAL_COMMUNITY)
Admission: RE | Admit: 2013-05-29 | Discharge: 2013-05-29 | Disposition: A | Discharge status: Home / Self Care | Payer: Medicare Other | Source: Ambulatory Visit | Attending: Dentistry | Admitting: Dentistry

## 2013-05-29 VITALS — BP 115/68 | HR 109 | Temp 97.4°F | Resp 20 | Ht 72.0 in | Wt 173.0 lb

## 2013-05-29 DIAGNOSIS — N189 Chronic kidney disease, unspecified: Secondary | ICD-10-CM

## 2013-05-29 DIAGNOSIS — I10 Essential (primary) hypertension: Secondary | ICD-10-CM

## 2013-05-29 DIAGNOSIS — C8589 Other specified types of non-Hodgkin lymphoma, extranodal and solid organ sites: Secondary | ICD-10-CM

## 2013-05-29 DIAGNOSIS — C8581 Other specified types of non-Hodgkin lymphoma, lymph nodes of head, face, and neck: Secondary | ICD-10-CM

## 2013-05-29 HISTORY — DX: Essential (primary) hypertension: I10

## 2013-05-29 LAB — COMPREHENSIVE METABOLIC PANEL (CC13)
ALT: 18 U/L (ref 0–55)
AST: 22 U/L (ref 5–34)
Alkaline Phosphatase: 75 U/L (ref 40–150)
Creatinine: 1.9 mg/dL — ABNORMAL HIGH (ref 0.7–1.3)
Sodium: 139 mEq/L (ref 136–145)
Total Bilirubin: 1.04 mg/dL (ref 0.20–1.20)

## 2013-05-29 LAB — CBC WITH DIFFERENTIAL/PLATELET
BASO%: 0.6 % (ref 0.0–2.0)
EOS%: 1.3 % (ref 0.0–7.0)
HCT: 39.5 % (ref 38.4–49.9)
LYMPH%: 13.2 % — ABNORMAL LOW (ref 14.0–49.0)
MCH: 29.6 pg (ref 27.2–33.4)
MCHC: 33.8 g/dL (ref 32.0–36.0)
MONO%: 10.2 % (ref 0.0–14.0)
NEUT%: 74.7 % (ref 39.0–75.0)
Platelets: 155 10*3/uL (ref 140–400)
RBC: 4.53 10*6/uL (ref 4.20–5.82)

## 2013-05-29 LAB — APTT: aPTT: 33 seconds (ref 24–37)

## 2013-05-29 LAB — LACTATE DEHYDROGENASE (CC13): LDH: 200 U/L (ref 125–245)

## 2013-05-29 MED ORDER — CEFAZOLIN SODIUM-DEXTROSE 2-3 GM-% IV SOLR
2.0000 g | Freq: Once | INTRAVENOUS | Status: AC
  Administered 2013-05-30: 2 g via INTRAVENOUS
  Filled 2013-05-29: qty 50

## 2013-05-29 MED ORDER — PROCHLORPERAZINE MALEATE 10 MG PO TABS: 10.0000 mg | ORAL_TABLET | Freq: Four times a day (QID) | ORAL | Status: DC | PRN

## 2013-05-29 MED ORDER — ONDANSETRON HCL 8 MG PO TABS: 8.0000 mg | ORAL_TABLET | Freq: Three times a day (TID) | ORAL | Status: DC | PRN

## 2013-05-29 MED ORDER — PREDNISONE 10 MG PO TABS: 40.0000 mg | ORAL_TABLET | Freq: Every day | ORAL | Status: DC

## 2013-05-29 MED ORDER — LIDOCAINE-PRILOCAINE 2.5-2.5 % EX CREA: TOPICAL_CREAM | CUTANEOUS | Status: DC | PRN

## 2013-05-29 NOTE — Progress Notes (Signed)
[facility]  [phone] [phone]   OFFICE PROGRESS NOTE   Cc:  [name], MD  DIAGNOSIS:  Left nose DLBCL.   PAST THERAPY:  Biopsy only.   CURRENT THERAPY: due to start therapy soon.   INTERVAL HISTORY: Gabriel Estrada 79 y.o. male returns for regular follow up with his two sisters.  He was AWOL for a while.  Since then, his ENT physician Dr. Ezzard Standing during a routine visit noticed that the left nasal mass has grown.  He now has chronic congestion in the left now.  There is also swelling of the left nose bridge.  He denied bleeding, hearing loss, anorexia, weight loss, lymph node swelling.  He has mild chronic fatigue.  He is not very active.  However, he is independent of all activities of daily living.  The rest of the 14-point review of system was negative.     Past Medical History  Diagnosis Date  . Coronary artery disease 03/15/2001    x 1 with MI  . A-fib   . PVD (peripheral vascular disease)   . Hyperlipidemia   . Mitral regurgitation     moderate;  with postinfarction class 4 unstable angina  . Myocardial infarction   . CHF (congestive heart failure)     class 4 congestive heart failure  . Hypertension   . Chronic kidney disease, unspecified   . Cancer     NASAL    Past Surgical History  Procedure Laterality Date  . Coronary artery bypass graft  03/15/01    x 5    Current Outpatient Prescriptions  Medication Sig Dispense Refill  . carvedilol (COREG) 25 MG tablet Take 1 tablet (25 mg total) by mouth 2 (two) times daily with a meal.  180 tablet  3  . digoxin (LANOXIN) 0.125 MG tablet Take 1 tablet (125 mcg total) by mouth daily.  90 tablet  3  . enalapril (VASOTEC) 10 MG tablet Take 0.5 tablets (5 mg total) by mouth daily.  45 tablet  3  . VYTORIN 10-40 MG per tablet TAKE ONE TABLET BY MOUTH EVERY DAY  30 tablet  6  . lidocaine-prilocaine (EMLA) cream Apply topically as needed.  30 g  2  . ondansetron (ZOFRAN) 8 MG  tablet Take 1 tablet (8 mg total) by mouth every 8 (eight) hours as needed for nausea.  20 tablet  0  . predniSONE (DELTASONE) 10 MG tablet Take 4 tablets (40 mg total) by mouth daily.  20 tablet  3  . prochlorperazine (COMPAZINE) 10 MG tablet Take 1 tablet (10 mg total) by mouth every 6 (six) hours as needed.  30 tablet  3  . warfarin (COUMADIN) 5 MG tablet Take 2.5-5 mg by mouth daily. Take 2.5 mg on Tuesday and Friday. Take 5 mg all other days       No current facility-administered medications for this visit.   Facility-Administered Medications Ordered in Other Visits  Medication Dose Route Frequency Provider Last Rate Last Dose  . [START ON 05/30/2013] ceFAZolin (ANCEF) IVPB 2 g/50 mL premix  2 g Intravenous Once Charlynne Pander, DDS        ALLERGIES:  has No Known Allergies.  REVIEW OF SYSTEMS:  The rest of the 14-point review of system was negative.   Filed Vitals:   05/29/13 1341  BP: 115/68  Pulse: 109  Temp: 97.4 F (36.3 C)  Resp: 20   Wt Readings from Last 3 Encounters:  05/29/13 172  lb 11.2 oz (78.336 kg)  05/29/13 173 lb (78.472 kg)  05/21/13 174 lb 3.2 oz (79.017 kg)   ECOG Performance status: 1-2  PHYSICAL EXAMINATION:   General:  Thin-appearing man, in no acute distress.  Eyes:  no scleral icterus.  ENT:  There was an obstructing mass in the left nostril with swelling of the left nose bridge.  Neck was without thyromegaly.  Lymphatics:  Negative cervical, supraclavicular or axillary adenopathy.  Respiratory: lungs were clear bilaterally without wheezing or crackles.  Cardiovascular:  Irregularly iregular rate and rhythm, S1/S2, without murmur, rub or gallop.  There was no pedal edema.  GI:  abdomen was soft, flat, nontender, nondistended, without organomegaly.  Muscoloskeletal:  no spinal tenderness of palpation of vertebral spine.  Skin exam was without echymosis, petichae.  Neuro exam was nonfocal.  Patient was able to get on and off exam table without assistance.   Gait was normal.  Patient was alert and oriented.  Attention was good.   Language was appropriate.  Mood was normal without depression.  Speech was not pressured.  Thought content was not tangential.       LABORATORY/RADIOLOGY DATA:  Lab Results  Component Value Date   WBC 7.6 05/29/2013   HGB 13.4 05/29/2013   HCT 39.5 05/29/2013   PLT 155 05/29/2013   GLUCOSE 98 05/29/2013   CHOL 124 10/11/2012   TRIG 88.0 10/11/2012   HDL 51.70 10/11/2012   LDLCALC 55 10/11/2012   ALKPHOS 75 05/29/2013   ALT 18 05/29/2013   AST 22 05/29/2013   NA 139 05/29/2013   K 5.1 05/29/2013   CL 104 05/29/2013   CREATININE 1.9* 05/29/2013   BUN 44.2* 05/29/2013   CO2 25 05/29/2013   INR 1.17 05/29/2013    Dg Chest 2 View  05/29/2013   *RADIOLOGY REPORT*  Clinical Data: Preop for dental extraction was, shortness of breath, on medication for hypertension, former smoking history  CHEST - 2 VIEW  Comparison: None.  Findings: The lungs are hyperaerated consistent with emphysema.  No focal infiltrate or effusion is seen.  Mediastinal contours appear normal.  Heart is mildly enlarged.  Median sternotomy sutures are noted from prior CABG.  No acute bony abnormality is seen.  IMPRESSION: Emphysema.  Cardiomegaly.  No active lung disease.   Original Report Authenticated By: Dwyane Dee, M.D.   Ct Outside Films Head/face  05/24/2013   This examination belongs to an outside facility and is stored  here for comparison purposes only.  Contact the originating outside  institution for any associated report or interpretation.      ASSESSMENT AND PLAN:   1.  Diagnosis:  Progression of nasal lymphoma. 2.  Recommendation:  Frontline treatment is still chemotherapy with aim to cure the cancer.  If patients decide not to pursue chemo, then palliative option (not curative) is radiation.  With R-CHOP, I will start with 50% dose reduction given his age.  If he tolerates well, we may consider increasing the dose.   He agreed to go ahead with work up and  treatment with chemo and reserve radiation either for consolidation or salvage.  3.  What to do to prepare for therapy:   * PET scan to complete staging work up.   * We can skip bone marrow biopsy as he was very against this last time.  * Referral to 2-D echocardiogram to assess baseline heart function.  * Referral to Interventional Radiology for Portacath placement.  * Referral to chemo class to  learn about practical tips while on chemo.  * Pick up nausea medications from your preferred pharmacy.   4.  Follow up: in about 2 weeks to go over staging info and start chemo at that time.      The length of time of the face-to-face encounter was 25 minutes. More than 50% of time was spent counseling and coordination of care.

## 2013-05-29 NOTE — Progress Notes (Signed)
Anesthesia PAT Evaluation:  Patient is a 79 year old male scheduled for multiple dental extraction with alveoloplasty on 05/30/13 by Dr. Kristin Bruins.  PAT appointment was on 05/29/13 @ 1500.   Procedure is scheduled for pre-chemotherapy.  History includes former smoker, diffuse large B cell non-Hodgkin's lymphoma of the left nasal cavity, CAD s/p CABG X 5 '02, afib, PVD, CHF, CKD, HLD, mild MR by '13 echo.  Patient is also scheduled for a port-a-cath in the near future in Radiology.  Cardiologist is Dr. Antoine Poche, last visit on 01/08/13.  Afib was rate controlled and he was asymptomatic from a CAD standpoint, so no further cardiac testing was ordered at that time.  According to Dr. Luretha Murphy notes, he contacted Dr. Antoine Poche about planned procedure and gave permission to hold Coumadin for five days pre-operatively.  I spoke with patient today during his PAT visit, and he continued to deny chest pain or SOB.  He cannot breath well thru his left nares due to the lymphoma.  Heart rate was irregularly irregular, controlled rate.  Lungs clear.  No significant LE edema.  Echo on 03/27/12 showed: - Left ventricle: The cavity size was normal. Wall thickness was increased in a pattern of mild LVH. Systolic function was mildly reduced. The estimated ejection fraction was in the range of 45% to 50%. Wall motion was normal; there were no regional wall motion abnormalities. - Mitral valve: Mild regurgitation. - Atrial septum: No defect or patent foramen ovale was identified. - Tricuspid valve: Trivial regurgitation.  Nuclear stress test on 12/06/10 showed fixed defect consistent with prior infarct, no ischemia.  EF 33%, global hypokinesis, LVH.  EKG on 01/08/13 showed rate controlled afib, right BBB, inferior T wave abnormality, consider ischemia.  CXR on 05/29/13 showed emphysema, no active lung disease.  Labs from 05/29/13 noted. H/H WNL. Cr 1.9, stable since 12/25/12.  PT/INR 14.6/1.17. PTT 33.    Patient denied any  acute CV symptoms and labs appear acceptable for OR.  Anticipate that he can proceed as planned.  He will be evaluated by his assigned anesthesiologist on the day of surgery to discuss the definitive anesthesia plan.  Velna Ochs [facility] Short Stay Center/Anesthesiology Phone [phone] 05/29/2013 4:38 PM

## 2013-05-29 NOTE — Pre-Procedure Instructions (Signed)
Gabriel Estrada  05/29/2013   Your procedure is scheduled on:  Thursday, June 5th  Report to [facility] Short Stay Center at 0530 AM. Come to main entrance "A" and go to east elevators up to 3rd floor. Check in at short stay desk.  Call this number if you have problems the morning of surgery: [phone]   Remember:   Do not eat food or drink liquids after midnight.   Take these medicines the morning of surgery with A SIP OF WATER: Coreg, Digoxin   Do not wear jewelry, make-up or nail polish.  Do not wear lotions, powders, or perfumes, deodorant.  Do not shave 48 hours prior to surgery. Men may shave face and neck.  Do not bring valuables to the hospital.  [facility] is not responsible for any belongings or valuables.  Contacts, dentures or bridgework may not be worn into surgery.  Leave suitcase in the car. After surgery it may be brought to your room.  For patients admitted to the hospital, checkout time is 11:00 AM the day of discharge.   Patients discharged the day of surgery will not be allowed to drive home.    Special Instructions: Shower using CHG 2 nights before surgery and the night before surgery.  If you shower the day of surgery use CHG.  Use special wash - you have one bottle of CHG for all showers.  You should use approximately 1/3 of the bottle for each shower.   Please read over the following fact sheets that you were given: Pain Booklet, Coughing and Deep Breathing and Surgical Site Infection Prevention

## 2013-05-29 NOTE — Patient Instructions (Addendum)
1.  Diagnosis:  Progression of nasal lymphoma. 2.  Recommendation:  Frontline treatment is still chemotherapy with aim to cure the cancer.  If patients decide not to pursue chemo, then palliative option (not curative) is radiation. 3.  What to do to prepare for therapy:   * PET scan to complete staging work up.  * We can skip bone marrow biopsy.  * Referral to 2-D echocardiogram to assess baseline heart function.  * Referral to Interventional Radiology for Portacath placement.  * Referral to chemo class to learn about practical tips while on chemo.  * Pick up nausea medications from your preferred pharmacy.

## 2013-05-29 NOTE — Telephone Encounter (Signed)
Left message for Gabriel Estrada at Interventional Radiology that its OK for pt to hold warfarin as needed per Dr Antoine Poche.  Requested she call back if further needs or questions

## 2013-05-29 NOTE — Telephone Encounter (Signed)
Having port -a cath placement and needs to hold coumadin for 4 days  Aware I will have MD review and call them back

## 2013-05-29 NOTE — Telephone Encounter (Signed)
Ok to hold warfarin as needed.

## 2013-05-29 NOTE — Telephone Encounter (Signed)
New problem   Radiology need an ok to stop pt's coumadin for 4 day. Please callTina/[facility].

## 2013-05-30 ENCOUNTER — Encounter (HOSPITAL_COMMUNITY): Payer: Self-pay | Admitting: Vascular Surgery

## 2013-05-30 ENCOUNTER — Telehealth: Payer: Self-pay | Admitting: Oncology

## 2013-05-30 ENCOUNTER — Encounter (HOSPITAL_COMMUNITY): Payer: Self-pay | Admitting: *Deleted

## 2013-05-30 ENCOUNTER — Encounter (HOSPITAL_COMMUNITY)
Admission: RE | Disposition: A | Discharge status: Home / Self Care | Payer: Self-pay | Source: Ambulatory Visit | Attending: Dentistry

## 2013-05-30 ENCOUNTER — Ambulatory Visit (HOSPITAL_COMMUNITY)
Admission: RE | Admit: 2013-05-30 | Discharge: 2013-05-30 | Disposition: A | Discharge status: Home / Self Care | Payer: Medicare Other | Source: Ambulatory Visit | Attending: Dentistry | Admitting: Dentistry

## 2013-05-30 ENCOUNTER — Ambulatory Visit (HOSPITAL_COMMUNITY): Payer: Medicare Other | Admitting: Anesthesiology

## 2013-05-30 DIAGNOSIS — K083 Retained dental root: Secondary | ICD-10-CM

## 2013-05-30 DIAGNOSIS — Z951 Presence of aortocoronary bypass graft: Secondary | ICD-10-CM | POA: Insufficient documentation

## 2013-05-30 DIAGNOSIS — M278 Other specified diseases of jaws: Secondary | ICD-10-CM | POA: Diagnosis present

## 2013-05-30 DIAGNOSIS — I517 Cardiomegaly: Secondary | ICD-10-CM | POA: Insufficient documentation

## 2013-05-30 DIAGNOSIS — K029 Dental caries, unspecified: Secondary | ICD-10-CM | POA: Insufficient documentation

## 2013-05-30 DIAGNOSIS — K0530 Chronic periodontitis, unspecified: Secondary | ICD-10-CM

## 2013-05-30 DIAGNOSIS — N189 Chronic kidney disease, unspecified: Secondary | ICD-10-CM | POA: Insufficient documentation

## 2013-05-30 DIAGNOSIS — I059 Rheumatic mitral valve disease, unspecified: Secondary | ICD-10-CM | POA: Insufficient documentation

## 2013-05-30 DIAGNOSIS — E785 Hyperlipidemia, unspecified: Secondary | ICD-10-CM | POA: Insufficient documentation

## 2013-05-30 DIAGNOSIS — K045 Chronic apical periodontitis: Secondary | ICD-10-CM | POA: Insufficient documentation

## 2013-05-30 DIAGNOSIS — C8589 Other specified types of non-Hodgkin lymphoma, extranodal and solid organ sites: Secondary | ICD-10-CM | POA: Insufficient documentation

## 2013-05-30 DIAGNOSIS — I2510 Atherosclerotic heart disease of native coronary artery without angina pectoris: Secondary | ICD-10-CM | POA: Insufficient documentation

## 2013-05-30 DIAGNOSIS — I4891 Unspecified atrial fibrillation: Secondary | ICD-10-CM | POA: Insufficient documentation

## 2013-05-30 DIAGNOSIS — I509 Heart failure, unspecified: Secondary | ICD-10-CM | POA: Insufficient documentation

## 2013-05-30 DIAGNOSIS — I129 Hypertensive chronic kidney disease with stage 1 through stage 4 chronic kidney disease, or unspecified chronic kidney disease: Secondary | ICD-10-CM | POA: Insufficient documentation

## 2013-05-30 DIAGNOSIS — M270 Developmental disorders of jaws: Secondary | ICD-10-CM

## 2013-05-30 DIAGNOSIS — J438 Other emphysema: Secondary | ICD-10-CM | POA: Insufficient documentation

## 2013-05-30 DIAGNOSIS — Z79899 Other long term (current) drug therapy: Secondary | ICD-10-CM | POA: Insufficient documentation

## 2013-05-30 DIAGNOSIS — I739 Peripheral vascular disease, unspecified: Secondary | ICD-10-CM | POA: Insufficient documentation

## 2013-05-30 DIAGNOSIS — IMO0002 Reserved for concepts with insufficient information to code with codable children: Secondary | ICD-10-CM | POA: Insufficient documentation

## 2013-05-30 DIAGNOSIS — I252 Old myocardial infarction: Secondary | ICD-10-CM | POA: Insufficient documentation

## 2013-05-30 DIAGNOSIS — Z7901 Long term (current) use of anticoagulants: Secondary | ICD-10-CM | POA: Insufficient documentation

## 2013-05-30 HISTORY — PX: MULTIPLE EXTRACTIONS WITH ALVEOLOPLASTY: SHX5342

## 2013-05-30 SURGERY — MULTIPLE EXTRACTION WITH ALVEOLOPLASTY
Anesthesia: General | Site: Mouth | Wound class: Clean Contaminated

## 2013-05-30 MED ORDER — OXYCODONE-ACETAMINOPHEN 5-325 MG PO TABS: ORAL_TABLET | ORAL | Status: DC

## 2013-05-30 MED ORDER — NEOSTIGMINE METHYLSULFATE 1 MG/ML IJ SOLN
INTRAMUSCULAR | Status: DC | PRN
  Administered 2013-05-30: 5 mg via INTRAVENOUS

## 2013-05-30 MED ORDER — PROPOFOL 10 MG/ML IV BOLUS
INTRAVENOUS | Status: DC | PRN
Start: 2013-05-30 — End: 2013-05-30
  Administered 2013-05-30: 110 mg via INTRAVENOUS

## 2013-05-30 MED ORDER — LIDOCAINE-EPINEPHRINE 2 %-1:100000 IJ SOLN
INTRAMUSCULAR | Status: DC | PRN
  Administered 2013-05-30: 10.2 mL

## 2013-05-30 MED ORDER — PHENYLEPHRINE HCL 10 MG/ML IJ SOLN
10.0000 mg | INTRAVENOUS | Status: DC | PRN
  Administered 2013-05-30: 20 ug/min via INTRAVENOUS

## 2013-05-30 MED ORDER — ACETAMINOPHEN 10 MG/ML IV SOLN: 1000.0000 mg | Freq: Once | INTRAVENOUS | Status: DC | PRN

## 2013-05-30 MED ORDER — LACTATED RINGERS IV SOLN
INTRAVENOUS | Status: DC | PRN
  Administered 2013-05-30 (×2): via INTRAVENOUS

## 2013-05-30 MED ORDER — FENTANYL CITRATE 0.05 MG/ML IJ SOLN
INTRAMUSCULAR | Status: DC | PRN
  Administered 2013-05-30: 100 ug via INTRAVENOUS

## 2013-05-30 MED ORDER — LIDOCAINE HCL (CARDIAC) 20 MG/ML IV SOLN
INTRAVENOUS | Status: DC | PRN
  Administered 2013-05-30: 50 mg via INTRAVENOUS

## 2013-05-30 MED ORDER — HYDROMORPHONE HCL PF 1 MG/ML IJ SOLN: 0.2500 mg | INTRAMUSCULAR | Status: DC | PRN

## 2013-05-30 MED ORDER — ROCURONIUM BROMIDE 100 MG/10ML IV SOLN
INTRAVENOUS | Status: DC | PRN
  Administered 2013-05-30: 50 mg via INTRAVENOUS

## 2013-05-30 MED ORDER — LIDOCAINE-EPINEPHRINE 2 %-1:100000 IJ SOLN
INTRAMUSCULAR | Status: AC
  Filled 2013-05-30: qty 8.5

## 2013-05-30 MED ORDER — BUPIVACAINE-EPINEPHRINE 0.5% -1:200000 IJ SOLN
INTRAMUSCULAR | Status: DC | PRN
  Administered 2013-05-30: 3.6 mL

## 2013-05-30 MED ORDER — ONDANSETRON HCL 4 MG/2ML IJ SOLN: 4.0000 mg | Freq: Once | INTRAMUSCULAR | Status: DC | PRN

## 2013-05-30 MED ORDER — AMINOCAPROIC ACID SOLUTION 5% (50 MG/ML)
10.0000 mL | ORAL | Status: DC
  Filled 2013-05-30: qty 100

## 2013-05-30 MED ORDER — 0.9 % SODIUM CHLORIDE (POUR BTL) OPTIME
TOPICAL | Status: DC | PRN
  Administered 2013-05-30: 1000 mL

## 2013-05-30 MED ORDER — BUPIVACAINE-EPINEPHRINE PF 0.5-1:200000 % IJ SOLN
INTRAMUSCULAR | Status: AC
  Filled 2013-05-30: qty 3.6

## 2013-05-30 MED ORDER — GLYCOPYRROLATE 0.2 MG/ML IJ SOLN
INTRAMUSCULAR | Status: DC | PRN
  Administered 2013-05-30: .6 mg via INTRAVENOUS

## 2013-05-30 MED ORDER — LIDOCAINE HCL 4 % MT SOLN
OROMUCOSAL | Status: DC | PRN
  Administered 2013-05-30: 4 mL via TOPICAL

## 2013-05-30 MED ORDER — HEMOSTATIC AGENTS (NO CHARGE) OPTIME
TOPICAL | Status: DC | PRN
  Administered 2013-05-30: 1 via TOPICAL

## 2013-05-30 SURGICAL SUPPLY — 35 items
ALCOHOL 70% 16 OZ (MISCELLANEOUS) ×2 IMPLANT
ATTRACTOMAT 16X20 MAGNETIC DRP (DRAPES) ×2 IMPLANT
BLADE SURG 15 STRL LF DISP TIS (BLADE) ×2 IMPLANT
BLADE SURG 15 STRL SS (BLADE) ×2
CLOTH BEACON ORANGE TIMEOUT ST (SAFETY) ×2 IMPLANT
COVER SURGICAL LIGHT HANDLE (MISCELLANEOUS) ×2 IMPLANT
CRADLE DONUT ADULT HEAD (MISCELLANEOUS) ×2 IMPLANT
GAUZE PACKING FOLDED 2  STR (GAUZE/BANDAGES/DRESSINGS) ×1
GAUZE PACKING FOLDED 2 STR (GAUZE/BANDAGES/DRESSINGS) ×1 IMPLANT
GAUZE SPONGE 4X4 16PLY XRAY LF (GAUZE/BANDAGES/DRESSINGS) ×2 IMPLANT
GLOVE SURG ORTHO 8.0 STRL STRW (GLOVE) ×2 IMPLANT
GLOVE SURG SS PI 6.5 STRL IVOR (GLOVE) ×2 IMPLANT
GOWN STRL REIN 3XL LVL4 (GOWN DISPOSABLE) ×2 IMPLANT
HEMOSTAT SURGICEL .5X2 ABSORB (HEMOSTASIS) IMPLANT
KIT BASIN OR (CUSTOM PROCEDURE TRAY) ×2 IMPLANT
KIT ROOM TURNOVER OR (KITS) ×2 IMPLANT
MANIFOLD NEPTUNE WASTE (CANNULA) ×2 IMPLANT
NEEDLE BLUNT 16X1.5 OR ONLY (NEEDLE) ×2 IMPLANT
NEEDLE DENTAL 27 LONG (NEEDLE) ×4 IMPLANT
NS IRRIG 1000ML POUR BTL (IV SOLUTION) ×2 IMPLANT
PACK EENT II TURBAN DRAPE (CUSTOM PROCEDURE TRAY) ×2 IMPLANT
PAD ARMBOARD 7.5X6 YLW CONV (MISCELLANEOUS) ×2 IMPLANT
SPONGE GAUZE 4X4 12PLY (GAUZE/BANDAGES/DRESSINGS) ×2 IMPLANT
SPONGE SURGIFOAM ABS GEL 100 (HEMOSTASIS) ×2 IMPLANT
SPONGE SURGIFOAM ABS GEL 12-7 (HEMOSTASIS) IMPLANT
SPONGE SURGIFOAM ABS GEL SZ50 (HEMOSTASIS) IMPLANT
SUCTION FRAZIER TIP 10 FR DISP (SUCTIONS) ×2 IMPLANT
SUT CHROMIC 3 0 PS 2 (SUTURE) ×8 IMPLANT
SUT CHROMIC 4 0 P 3 18 (SUTURE) ×2 IMPLANT
SYR 50ML SLIP (SYRINGE) ×2 IMPLANT
TOWEL OR 17X24 6PK STRL BLUE (TOWEL DISPOSABLE) ×2 IMPLANT
TOWEL OR 17X26 10 PK STRL BLUE (TOWEL DISPOSABLE) ×2 IMPLANT
TUBE CONNECTING 12X1/4 (SUCTIONS) ×2 IMPLANT
WATER STERILE IRR 1000ML POUR (IV SOLUTION) ×2 IMPLANT
YANKAUER SUCT BULB TIP NO VENT (SUCTIONS) ×2 IMPLANT

## 2013-05-30 NOTE — Anesthesia Procedure Notes (Signed)
Procedure Name: Intubation Date/Time: 05/30/2013 7:32 AM Performed by: Coralee Rud Pre-anesthesia Checklist: Patient identified, Emergency Drugs available, Suction available, Patient being monitored and Timeout performed Patient Re-evaluated:Patient Re-evaluated prior to inductionOxygen Delivery Method: Circle system utilized Preoxygenation: Pre-oxygenation with 100% oxygen Intubation Type: IV induction Laryngoscope Size: 3 Grade View: Grade III Tube type: Oral Tube size: 8.0 mm Number of attempts: 1 Airway Equipment and Method: Stylet and LTA kit utilized Placement Confirmation: ETT inserted through vocal cords under direct vision,  positive ETCO2 and breath sounds checked- equal and bilateral (Laryngeal pressure by Dr. Noreene Larsson improved view greatly) Secured at: 22 cm Tube secured with: Tape Dental Injury: Teeth and Oropharynx as per pre-operative assessment

## 2013-05-30 NOTE — Preoperative (Signed)
Beta Blockers   Reason not to administer Beta Blockers:Not Applicable 

## 2013-05-30 NOTE — Progress Notes (Signed)
Pt d/c to [facility] by dora lopez nt via w/c

## 2013-05-30 NOTE — Progress Notes (Signed)
05/30/2013  PRE-OPERATIVE NOTE:  05/30/2013 Gabriel Estrada [id number]  VITALS: BP 125/74  Pulse 82  Temp(Src) 97.9 F (36.6 C) (Oral)  Resp 20  SpO2 93%  Lab Results  Component Value Date   WBC 7.6 05/29/2013   HGB 13.4 05/29/2013   HCT 39.5 05/29/2013   MCV 87.3 05/29/2013   PLT 155 05/29/2013   BMET    Component Value Date/Time   NA 139 05/29/2013 1328   NA 140 12/28/2010 1001   K 5.1 05/29/2013 1328   K 5.1 12/28/2010 1001   CL 104 05/29/2013 1328   CL 104 12/28/2010 1001   CO2 25 05/29/2013 1328   CO2 29 12/28/2010 1001   GLUCOSE 98 05/29/2013 1328   GLUCOSE 74 12/28/2010 1001   BUN 44.2* 05/29/2013 1328   BUN 26* 12/28/2010 1001   CREATININE 1.9* 05/29/2013 1328   CREATININE 1.6* 12/28/2010 1001   CALCIUM 10.0 05/29/2013 1328   CALCIUM 9.5 12/28/2010 1001   GFRNONAA 47.10* 12/24/2010 1257    Lab Results  Component Value Date   INR 1.17 05/29/2013   INR 2.6 05/03/2013   INR 2.4 04/05/2013   Last PTT:  33   Gabriel Estrada presents for multiple extractions with alveoloplasty and pre-preproshtetic surgery in the OR with general anesthesia.  Patient discontinued warfarin therapy and PT/PTT are now normal.   SUBJECTIVE: The patient denies any acute medical or dental changes and agrees to proceed with treatment as planned.  EXAM: No sign of acute dental changes.  ASSESSMENT: Patient is affected by chronic apical periodontitis, multiple retained root segments, chronic periodontitis, dental caries, and presence of exostoses and mandibular tori.  PLAN: Patient agrees to proceed with treatment as planned in the operating room as previously discussed and accepts the risks, benefits, complications of the proposed treatment.  Charlynne Pander, DDS

## 2013-05-30 NOTE — H&P (Signed)
05/30/2013  Patient:            Gabriel Estrada Date of Birth:  1928/03/29 MRN:                [id number]  Please see Dr. Raliegh Ip Ha's note of 05/29/13 to use as H and P for dental OR procedure.  Dr. Ree Kida  05/29/2013 2:00 PM   Office Visit  MRN:  [id number]   Description: 79 year old male  Provider: Exie Parody, MD  Department: [facility]        Diagnoses    Other malignant lymphomas, unspecified site, extranodal and solid organ sites    -  Primary    202.80         Current Vitals - Last Recorded    BP Pulse Temp(Src) Resp Ht Wt    115/68 109 97.4 F (36.3 C) (Oral) 20 6' (1.829 m) 173 lb (78.472 kg)       BMI              23.46 kg/m2                 Progress Notes    Exie Parody, MD at 05/29/2013  6:03 PM    Status: Signed                      [facility]   [phone] [phone]     OFFICE PROGRESS NOTE     Cc:  [name], MD   DIAGNOSIS:  Left nose DLBCL.    PAST THERAPY:  Biopsy only.    CURRENT THERAPY: due to start therapy soon.    INTERVAL HISTORY: Gabriel Estrada 79 y.o. male returns for regular follow up with his two sisters.  He was AWOL for a while.  Since then, his ENT physician Dr. Ezzard Standing during a routine visit noticed that the left nasal mass has grown.  He now has chronic congestion in the left now.  There is also swelling of the left nose bridge.  He denied bleeding, hearing loss, anorexia, weight loss, lymph node swelling.  He has mild chronic fatigue.  He is not very active.  However, he is independent of all activities of daily living.  The rest of the 14-point review of system was negative.         Past Medical History   Diagnosis  Date   .  Coronary artery disease  03/15/2001       x 1 with MI   .  A-fib     .  PVD (peripheral vascular disease)     .  Hyperlipidemia     .  Mitral regurgitation         moderate;  with postinfarction class 4 unstable angina   .   Myocardial infarction     .  CHF (congestive heart failure)         class 4 congestive heart failure   .  Hypertension     .  Chronic kidney disease, unspecified     .  Cancer         NASAL         Past Surgical History   Procedure  Laterality  Date   .  Coronary artery bypass graft    03/15/01       x 5         Current Outpatient Prescriptions   Medication  Sig  Dispense  Refill   .  carvedilol (COREG) 25 MG tablet  Take 1 tablet (25 mg total) by mouth 2 (two) times daily with a meal.   180 tablet   3   .  digoxin (LANOXIN) 0.125 MG tablet  Take 1 tablet (125 mcg total) by mouth daily.   90 tablet   3   .  enalapril (VASOTEC) 10 MG tablet  Take 0.5 tablets (5 mg total) by mouth daily.   45 tablet   3   .  VYTORIN 10-40 MG per tablet  TAKE ONE TABLET BY MOUTH EVERY DAY   30 tablet   6   .  lidocaine-prilocaine (EMLA) cream  Apply topically as needed.   30 g   2   .  ondansetron (ZOFRAN) 8 MG tablet  Take 1 tablet (8 mg total) by mouth every 8 (eight) hours as needed for nausea.   20 tablet   0   .  predniSONE (DELTASONE) 10 MG tablet  Take 4 tablets (40 mg total) by mouth daily.   20 tablet   3   .  prochlorperazine (COMPAZINE) 10 MG tablet  Take 1 tablet (10 mg total) by mouth every 6 (six) hours as needed.   30 tablet   3   .  warfarin (COUMADIN) 5 MG tablet  Take 2.5-5 mg by mouth daily. Take 2.5 mg on Tuesday and Friday. Take 5 mg all other days             No current facility-administered medications for this visit.       Facility-Administered Medications Ordered in Other Visits   Medication  Dose  Route  Frequency  Provider  Last Rate  Last Dose   .  [START ON 05/30/2013] ceFAZolin (ANCEF) IVPB 2 g/50 mL premix   2 g  Intravenous  Once  Charlynne Pander, DDS              ALLERGIES:  has No Known Allergies.   REVIEW OF SYSTEMS:  The rest of the 14-point review of system was negative.     Filed Vitals:     05/29/13 1341   BP:  115/68   Pulse:  109   Temp:  97.4  F (36.3 C)   Resp:  20       Wt Readings from Last 3 Encounters:   05/29/13  172 lb 11.2 oz (78.336 kg)   05/29/13  173 lb (78.472 kg)   05/21/13  174 lb 3.2 oz (79.017 kg)      ECOG Performance status: 1-2   PHYSICAL EXAMINATION:     General:  Thin-appearing man, in no acute distress.  Eyes:  no scleral icterus.  ENT:  There was an obstructing mass in the left nostril with swelling of the left nose bridge.  Neck was without thyromegaly.  Lymphatics:  Negative cervical, supraclavicular or axillary adenopathy.  Respiratory: lungs were clear bilaterally without wheezing or crackles.  Cardiovascular:  Irregularly iregular rate and rhythm, S1/S2, without murmur, rub or gallop.  There was no pedal edema.  GI:  abdomen was soft, flat, nontender, nondistended, without organomegaly.  Muscoloskeletal:  no spinal tenderness of palpation of vertebral spine.  Skin exam was without echymosis, petichae.  Neuro exam was nonfocal.  Patient was able to get on and off exam table without assistance.  Gait was normal.  Patient was alert and oriented.  Attention was good.   Language was appropriate.  Mood was normal without  depression.  Speech was not pressured.  Thought content was not tangential.           LABORATORY/RADIOLOGY DATA:    Lab Results   Component  Value  Date     WBC  7.6  05/29/2013     HGB  13.4  05/29/2013     HCT  39.5  05/29/2013     PLT  155  05/29/2013     GLUCOSE  98  05/29/2013     CHOL  124  10/11/2012     TRIG  88.0  10/11/2012     HDL  51.70  10/11/2012     LDLCALC  55  10/11/2012     ALKPHOS  75  05/29/2013     ALT  18  05/29/2013     AST  22  05/29/2013     NA  139  05/29/2013     K  5.1  05/29/2013     CL  104  05/29/2013     CREATININE  1.9*  05/29/2013     BUN  44.2*  05/29/2013     CO2  25  05/29/2013     INR  1.17  05/29/2013        Dg Chest 2 View   05/29/2013   *RADIOLOGY REPORT*  Clinical Data: Preop for dental extraction was, shortness of breath, on medication for  hypertension, former smoking history  CHEST - 2 VIEW  Comparison: None.  Findings: The lungs are hyperaerated consistent with emphysema.  No focal infiltrate or effusion is seen.  Mediastinal contours appear normal. Heart is mildly enlarged.  Median sternotomy sutures are noted from prior CABG.  No acute bony abnormality is seen.  IMPRESSION: Emphysema.  Cardiomegaly.  No active lung disease.   Original Report Authenticated By: Dwyane Dee, M.D.    Ct Outside Films Head/face   05/24/2013   This examination belongs to an outside facility and is stored  here for comparison purposes only.  Contact the originating outside  institution for any associated report or interpretation.           ASSESSMENT AND PLAN:    1.  Diagnosis:  Progression of nasal lymphoma. 2.  Recommendation:  Frontline treatment is still chemotherapy with aim to cure the cancer.  If patients decide not to pursue chemo, then palliative option (not curative) is radiation.  With R-CHOP, I will start with 50% dose reduction given his age.  If he tolerates well, we may consider increasing the dose.   He agreed to go ahead with work up and treatment with chemo and reserve radiation either for consolidation or salvage.   3.  What to do to prepare for therapy:    * PET scan to complete staging work up.    * We can skip bone marrow biopsy as he was very against this last time.   * Referral to 2-D echocardiogram to assess baseline heart function.   * Referral to Interventional Radiology for Portacath placement.   * Referral to chemo class to learn about practical tips while on chemo.   * Pick up nausea medications from your preferred pharmacy.     4.  Follow up: in about 2 weeks to go over staging info and start chemo at that time.          The length of time of the face-to-face encounter was 25 minutes. More than 50% of time was spent counseling and coordination of care.  Not  recorded     Medications Ordered This Encounter      Disp Refills Start End    predniSONE (DELTASONE) 10 MG tablet 20 tablet 3 05/29/2013      Take 4 tablets (40 mg total) by mouth daily. - Oral    Notes to Pharmacy: Prednisone 40mg  PO qam on days 1 through 5 of each chemo cycle.    ondansetron (ZOFRAN) 8 MG tablet 20 tablet 0 05/29/2013      Take 1 tablet (8 mg total) by mouth every 8 (eight) hours as needed for nausea. - Oral    prochlorperazine (COMPAZINE) 10 MG tablet 30 tablet 3 05/29/2013      Take 1 tablet (10 mg total) by mouth every 6 (six) hours as needed. - Oral    lidocaine-prilocaine (EMLA) cream 30 g 2 05/29/2013      Apply topically as needed. - Topical         Orders Placed This Encounter    Future Labs/Procedures    2D Echocardiogram with contrast [ZOX0960 Custom]    Expected by: 05/29/2013 (Approximate)    Expires: 05/29/2014    NM PET Image Initial (PI) Skull Base To Thigh [[id number] Custom]    Expected by: 05/29/2013 (Approximate)    Expires: 05/29/2014    IR Fluoro Guide CV Line Right [[id number] Custom]    Expected by: As directed    Expires: 07/29/2014         Patient Instructions    1.  Diagnosis:  Progression of nasal lymphoma. 2.  Recommendation:  Frontline treatment is still chemotherapy with aim to cure the cancer.  If patients decide not to pursue chemo, then palliative option (not curative) is radiation. 3.  What to do to prepare for therapy:    * PET scan to complete staging work up.   * We can skip bone marrow biopsy.   * Referral to 2-D echocardiogram to assess baseline heart function.   * Referral to Interventional Radiology for Portacath placement.   * Referral to chemo class to learn about practical tips while on chemo.   * Pick up nausea medications from your preferred pharmacy.            Patient Instructions History Recorded      Level of Service    PR OFFICE OUTPATIENT VISIT 25 MINUTES [99214]         All Flowsheet Templates (all recorded)     Amb Complex Vitals Nav Flowsheet    Custom Formula Data Flowsheet    Anthropometrics Flowsheet    Collaborative Assessment Flowsheet    Oncology Scheduling Plan Flowsheet           Referring Provider    [name], MD       All Charges for This Encounter    Code Description Service Date Service Provider Modifiers Qty    [phone] PR OFFICE OUTPATIENT VISIT 25 MINUTES 05/29/2013 Exie Parody, MD   1    [phone] PR CURRENT TOBACCO NON-USER 05/29/2013 Exie Parody, MD   1        Other Encounter Related Information    Allergies & Medications         Problem List         History         Patient-Entered Questionnaires   AVS Reports    Date/Time Report Action User    05/29/2013  1:56 PM After Visit Summary Printed Exie Parody, MD  Diabetic Foot Exam    No data filed      Diabetic Foot Form - Detailed    No data filed      Diabetic Foot Exam - Simple    No data filed

## 2013-05-30 NOTE — Anesthesia Preprocedure Evaluation (Addendum)
Anesthesia Evaluation  Patient identified by MRN, date of birth, ID band Patient awake    Reviewed: Allergy & Precautions, H&P , NPO status , Patient's Chart, lab work & pertinent test results  Airway Mallampati: III TM Distance: >3 FB Neck ROM: Limited    Dental  (+) Chipped, Poor Dentition and Dental Advisory Given   Pulmonary neg pulmonary ROS,  breath sounds clear to auscultation  Pulmonary exam normal       Cardiovascular hypertension, Pt. on home beta blockers and Pt. on medications + CAD, + Past MI, + Peripheral Vascular Disease and +CHF + dysrhythmias Atrial Fibrillation Rhythm:Irregular Rate:Normal     Neuro/Psych negative neurological ROS  negative psych ROS   GI/Hepatic negative GI ROS, Neg liver ROS,   Endo/Other  negative endocrine ROS  Renal/GU Renal InsufficiencyRenal disease     Musculoskeletal negative musculoskeletal ROS (+)   Abdominal Normal abdominal exam  (+) + obese,   Bowel sounds: normal.  Peds  Hematology negative hematology ROS (+)   Anesthesia Other Findings   Reproductive/Obstetrics negative OB ROS                          Anesthesia Physical Anesthesia Plan  ASA: III  Anesthesia Plan: General   Post-op Pain Management:    Induction: Intravenous  Airway Management Planned: Oral ETT  Additional Equipment:   Intra-op Plan:   Post-operative Plan: Extubation in OR  Informed Consent: I have reviewed the patients History and Physical, chart, labs and discussed the procedure including the risks, benefits and alternatives for the proposed anesthesia with the patient or authorized representative who has indicated his/her understanding and acceptance.   Dental advisory given  Plan Discussed with: Anesthesiologist, CRNA and Surgeon  Anesthesia Plan Comments: (Chronic Afib Nasopharyngeal lymphoma htn CAD S/P CABGx5 02/2001, no cardiac symptoms Nuclear  stress test on 12/06/10 showed fixed defect consistent with prior infarct, no ischemia.  EF 33%, global hypokinesis, LVH.  , )      Anesthesia Quick Evaluation

## 2013-05-30 NOTE — Anesthesia Postprocedure Evaluation (Signed)
  Anesthesia Post-op Note  Patient: Gabriel Estrada  Procedure(s) Performed: Procedure(s): Extraction of tooth #'s 2,6,7,8,10,11,12,18,20,21,22,23,24,25,26,27,28,29 with alveoloplasty and mandibular tori reductions, and lateral exostoses reductions. (N/A)  Patient Location: PACU  Anesthesia Type:General  Level of Consciousness: awake, alert  and oriented  Airway and Oxygen Therapy: Patient Spontanous Breathing and Patient connected to nasal cannula oxygen  Post-op Pain: mild  Post-op Assessment: Post-op Vital signs reviewed, Patient's Cardiovascular Status Stable, Respiratory Function Stable, Patent Airway and Pain level controlled  Post-op Vital Signs: stable  Complications: No apparent anesthesia complications

## 2013-05-30 NOTE — Op Note (Signed)
Patient:            Gabriel Estrada Date of Birth:  10-May-1928 MRN:                [id number]   DATE OF PROCEDURE:  05/30/2013               OPERATIVE REPORT   PREOPERATIVE DIAGNOSES: 1. 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 Pre-chemoradiation therapy dental protocol 3. Chronic apical periodontitis 4. Retained root segments 5. Multiple dental caries 6. Dental caries 7. Bilateral mandibular lingual tori 8. Maxillary right buccal exostoses  POSTOPERATIVE DIAGNOSES: 1. 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 Pre-chemoradiation therapy dental protocol 3. Chronic apical periodontitis 4. Retained root segments 5. Multiple dental caries 6. Dental caries 7. Bilateral mandibular lingual tori 8. Maxillary right buccal exostoses  OPERATIONS: 1. Multiple extraction of tooth numbers 2, 6, 7, 8, 10, 11, 12, 18, 20, 21, 22, 23, 24, 25, 26, 27, 28, and 29. 2. 4 Quadrants of alveoloplasty 3. Bilateral mandibular lingual tori reductions 4. Maxillary right buccal exostoses reductions   SURGEON: Charlynne Pander, DDS  ASSISTANT: Zettie Pho, (dental assistant)  ANESTHESIA: General anesthesia via oral endotracheal tube.  MEDICATIONS: 1. Ancef 2 g IV prior to invasive dental procedures. 2. Local anesthesia with a total utilization of 6 carpules each containing 34 mg of lidocaine with 0.017 mg of epinephrine as well as 2 carpules each containing 9 mg of bupivacaine with 0.009 mg of epinephrine.  SPECIMENS: There are 18 teeth that were discarded.  DRAINS: None  CULTURES: None  COMPLICATIONS: None   ESTIMATED BLOOD LOSS: 150 mLs.  INTRAVENOUS FLUIDS: 1100 mLs of Lactated ringers solution.  INDICATIONS: The patient was recently diagnosed with non-Hodgkin's lymphoma of the left nasal cavity.  A dental consultation was then requested as part of a pre-chemoradiation therapy dental protocol.  The patient was examined and treatment planned for extraction of  remaining teeth with alveoloplasty and pre-prosthetic surgery as indicated.  This treatment plan was formulated to decrease the risks and complications associated with dental infection from affecting the patient's systemic health while undergoing chemoradiation therapy and to prevent complications such as osteoradionecrosis.  OPERATIVE FINDINGS: Patient was examined operating room number 7.  The teeth were identified for extraction. The patient was noted be affected by chronic periodontitis, chronic apical periodontitis, multiple dental caries, multiple retained root segments, bilateral mandibular lingual tori, and maxillary right buccal exostoses.   DESCRIPTION OF PROCEDURE: Patient was brought to the main operating room number 7. Patient was then placed in the supine position on the operating table. General anesthesia was then induced per the anesthesia team. The patient was then prepped and draped in the usual manner for dental medicine procedure. A timeout was performed. The patient was identified and procedures were verified. A throat pack was placed at this time. The oral cavity was then thoroughly examined with the findings noted above. The patient was then ready for dental medicine procedure as follows:  Local anesthesia was then administered sequentially with a total utilization of 6 carpules each containing 34 mg of lidocaine with 0.017 mg of epinephrine as well as 2 carpules  each containing 9 mg bupivacaine with 0.009 mg of epinephrine.  The Maxillary left and right quadrants were first approached. Anesthesia was then delivered utilizing infiltration with lidocaine with epinephrine. A #15 blade incision was then made from the maxillary right tuberosity and extended to the distal of #15.  A  surgical flap was then  carefully reflected. Appropriate amounts of buccal and interseptal bone were then removed utilizing a surgical handpiece and bur and copious amounts of sterile water.  The teeth were  then subluxated with a series of straight elevators. Tooth numbers 2, 6, 7, 8, 10, 11, 12 were then removed with a 150 forceps without complications. Alveoloplasty was then performed utilizing a ronguers and bone file. At this point time, the maxillary right buccal exostoses in the area of tooth numbers 1 through 3 were visualized and reduced utilizing a rongeur and bone file appropriately. The surgical sites were then irrigated with copious amounts of sterile saline. The tissues were approximated and trimmed appropriately. A piece of Surgifoam was placed in the extraction sockets as indicated. The maxillary right surgical site was then closed from the maxillary right tuberosity and extended the mesial 8 utilizing 3-0 chromic gut suture in a continuous interrupted suture technique x1. The maxillary left surgical site was then closed from the distal of #15 extended the mesial #9 utilizing 3-0 chromic gut suture in a continuous interrupted suture technique x1. 3 separate interrupted sutures utilizing 4-0 chromic gut material were utilized to further close the surgical site in the area of tooth #11.   At this point time, the mandibular quadrants were approached. The patient was given bilateral inferior alveolar nerve blocks and long buccal nerve blocks utilizing the bupivacaine with epinephrine. Further infiltration was then achieved utilizing the lidocaine with epinephrine. A 15 blade incision was then made from the distal of number 17 and extended to the distal of #31 .  A surgical flap was then carefully reflected. Appropriate amounts of buccal and interseptal bone were then removed utilizing a surgical handpiece and bur and copious amounts of sterile water. Tooth numbers 18, 20, 21, 22, 23, 24, 25, 26, 27, 28, and 29 were then removed utilizing a 151 forceps. Alveoloplasty was then performed utilizing a rongeurs and bone file. At this point time the mandibular right and mandibular left lingual tori were  visualized and removed utilizing a surgical handpiece and bur and copious amounts of sterile water. Further alveoloplasty was then performed utilizing a rongeur and bone file. The tissues were approximated and trimmed appropriately. The surgical sites were then irrigated with copious amounts of sterile saline times 4. A piece of Surgifoam was then placed in the extraction sockets appropriately. The mandibular left surgical site was then closed from the distal of 17 and extended to the mesial of #25 utilizing 3-0 chromic gut suture in a continuous interrupted suture technique x1. The mandibular right surgical site was then closed from the distal of #31 and extended the mesial numbers 26 utilizing 3-0 chromic gut suture in a continuous interrupted suture technique x1. 3 interrupted sutures were then placed to further close the surgical site as needed.  At this point time, the entire mouth was irrigated with copious amounts of sterile saline. The patient was exam for complications, seeing none, the dental medicine procedure was deemed to be complete. The throat pack was removed at this time. A series of 4 x 4 gauze were placed in the mouth to aid hemostasis. The patient was then handed over to the anesthesia team for final disposition. After an appropriate amount of time, the patient was extubated and taken to the postanesthsia care unit with stable vital signs and a good condition. All counts were correct for the dental medicine procedure.  The patient is to utilize Amicar 5% oral rinse. Patient is to rinse with 10 mls every  hour for the next 10 hours. Patient is to swish and then spit out excess. Patient was given appropriate pain medication to utilize as needed. I will plan on holding warfarin therapy for an additional 3 days and then restarting at the 2.5 mg dose and then resume normal dosing per previous prescription. Patient to then followup with [facility] cardiology warfarin clinic as indicated. The patient  will be seen for suture removal on 06/10/2013.   Charlynne Pander, DDS.

## 2013-05-30 NOTE — Transfer of Care (Signed)
Immediate Anesthesia Transfer of Care Note  Patient: Gabriel Estrada  Procedure(s) Performed: Procedure(s): Extraction of tooth #'s 2,6,7,8,10,11,12,18,20,21,22,23,24,25,26,27,28,29 with alveoloplasty and mandibular tori reductions, and lateral exostoses reductions. (N/A)  Patient Location: PACU  Anesthesia Type:General  Level of Consciousness: awake, alert  and patient cooperative  Airway & Oxygen Therapy: Patient Spontanous Breathing and Patient connected to face mask oxygen  Post-op Assessment: Report given to PACU RN, Post -op Vital signs reviewed and stable and Patient moving all extremities  Post vital signs: Reviewed and stable  Complications: No apparent anesthesia complications

## 2013-05-31 ENCOUNTER — Encounter (HOSPITAL_COMMUNITY): Payer: Self-pay | Admitting: Dentistry

## 2013-06-03 ENCOUNTER — Telehealth: Payer: Self-pay | Admitting: Oncology

## 2013-06-03 ENCOUNTER — Encounter (HOSPITAL_COMMUNITY): Payer: Self-pay | Admitting: Pharmacy Technician

## 2013-06-04 ENCOUNTER — Ambulatory Visit (HOSPITAL_COMMUNITY): Payer: Medicare Other

## 2013-06-05 ENCOUNTER — Encounter (HOSPITAL_COMMUNITY)
Admission: RE | Admit: 2013-06-05 | Discharge: 2013-06-05 | Disposition: A | Discharge status: Home / Self Care | Payer: Medicare Other | Source: Ambulatory Visit | Attending: Oncology | Admitting: Oncology

## 2013-06-05 ENCOUNTER — Ambulatory Visit (HOSPITAL_COMMUNITY)
Admission: RE | Admit: 2013-06-05 | Discharge: 2013-06-05 | Disposition: A | Discharge status: Home / Self Care | Payer: Medicare Other | Source: Ambulatory Visit | Attending: Oncology | Admitting: Oncology

## 2013-06-05 ENCOUNTER — Other Ambulatory Visit: Payer: Self-pay | Admitting: Radiology

## 2013-06-05 DIAGNOSIS — I2510 Atherosclerotic heart disease of native coronary artery without angina pectoris: Secondary | ICD-10-CM | POA: Insufficient documentation

## 2013-06-05 DIAGNOSIS — I359 Nonrheumatic aortic valve disorder, unspecified: Secondary | ICD-10-CM | POA: Insufficient documentation

## 2013-06-05 DIAGNOSIS — E785 Hyperlipidemia, unspecified: Secondary | ICD-10-CM | POA: Insufficient documentation

## 2013-06-05 DIAGNOSIS — I739 Peripheral vascular disease, unspecified: Secondary | ICD-10-CM | POA: Insufficient documentation

## 2013-06-05 DIAGNOSIS — I519 Heart disease, unspecified: Secondary | ICD-10-CM

## 2013-06-05 DIAGNOSIS — C8589 Other specified types of non-Hodgkin lymphoma, extranodal and solid organ sites: Secondary | ICD-10-CM | POA: Insufficient documentation

## 2013-06-05 DIAGNOSIS — I779 Disorder of arteries and arterioles, unspecified: Secondary | ICD-10-CM | POA: Insufficient documentation

## 2013-06-05 DIAGNOSIS — Z01818 Encounter for other preprocedural examination: Secondary | ICD-10-CM | POA: Insufficient documentation

## 2013-06-05 DIAGNOSIS — Z951 Presence of aortocoronary bypass graft: Secondary | ICD-10-CM | POA: Insufficient documentation

## 2013-06-05 DIAGNOSIS — Z8249 Family history of ischemic heart disease and other diseases of the circulatory system: Secondary | ICD-10-CM | POA: Insufficient documentation

## 2013-06-05 DIAGNOSIS — I10 Essential (primary) hypertension: Secondary | ICD-10-CM | POA: Insufficient documentation

## 2013-06-05 LAB — GLUCOSE, CAPILLARY: Glucose-Capillary: 108 mg/dL — ABNORMAL HIGH (ref 70–99)

## 2013-06-05 MED ORDER — FLUDEOXYGLUCOSE F - 18 (FDG) INJECTION
20.9000 | Freq: Once | INTRAVENOUS | Status: AC | PRN
  Administered 2013-06-05: 20.9 via INTRAVENOUS

## 2013-06-05 NOTE — Progress Notes (Signed)
  Echocardiogram 2D Echocardiogram has been performed.  Gabriel Estrada 06/05/2013, 11:08 AM

## 2013-06-06 ENCOUNTER — Telehealth: Payer: Self-pay | Admitting: Oncology

## 2013-06-06 ENCOUNTER — Telehealth: Payer: Self-pay | Admitting: *Deleted

## 2013-06-06 ENCOUNTER — Other Ambulatory Visit: Payer: Medicare Other

## 2013-06-06 ENCOUNTER — Ambulatory Visit: Payer: Medicare Other | Admitting: Oncology

## 2013-06-06 NOTE — Telephone Encounter (Signed)
Sister called to R/S pt's chemo education class for today.  States pt having diarrhea started this morning along with "kidney problems."  Reports pt having a lot of Urgency and Frequency w/ urination, but does not urinate much when he is able to get to the Bathroom.  They had to go out and buy him some incontinence pads.   Pt does not have PCP and she is unsure who to contact about this problem or if it is related to his cancer diagnosis?  She also asking about results of PET from yesterday.  She hopes pt will feel well enough to keep his appt tomorrow for [facility] a Cath placement.

## 2013-06-06 NOTE — Telephone Encounter (Signed)
contacted pt to r/s chemo education class per pof...however pt wanted to delay all appts for 5weeks...i s/w cameo and advised on what the pt wanted to happen and she will contact pt.Marland KitchenMarland KitchenMarland Kitchen

## 2013-06-06 NOTE — Telephone Encounter (Signed)
He needs a primary care doctors for those complaints or go to Urgent care.  I will discuss result of PET scan in person.

## 2013-06-06 NOTE — Telephone Encounter (Signed)
Instructed sister to have pt go to Urgent Care if his urinary and bowel symptoms do not resolve.  Informed Scheduling will call to r/s the chemo class and Dr. Gaylyn Rong will review PET scan results on office visit next week.  She verbalized understanding.

## 2013-06-06 NOTE — Telephone Encounter (Signed)
Spoke w/ pt about his wanting to delay his treatment.  He states he wants to delay everything including Portacath and chemo class for at least 4 weeks.  He did agree to come in for office visit as scheduled next week on 6/18 to discuss with Dr. Gaylyn Rong.   He is aware that office visit w/ Dr. Basilio Cairo will also be made to discuss role of Radiation Treatment.   Notified Tina in IR of PAC canceled for now.

## 2013-06-07 ENCOUNTER — Ambulatory Visit (HOSPITAL_COMMUNITY): Admit: 2013-06-07 | Payer: Medicare Other

## 2013-06-07 ENCOUNTER — Encounter (HOSPITAL_COMMUNITY): Admission: RE | Admit: 2013-06-07 | Payer: Medicare Other | Source: Ambulatory Visit

## 2013-06-10 ENCOUNTER — Encounter (HOSPITAL_COMMUNITY): Payer: Self-pay | Admitting: Dentistry

## 2013-06-10 ENCOUNTER — Ambulatory Visit (HOSPITAL_COMMUNITY): Payer: Self-pay | Admitting: Dentistry

## 2013-06-10 VITALS — BP 110/69 | HR 96 | Temp 97.7°F

## 2013-06-10 DIAGNOSIS — K08109 Complete loss of teeth, unspecified cause, unspecified class: Secondary | ICD-10-CM

## 2013-06-10 DIAGNOSIS — C8589 Other specified types of non-Hodgkin lymphoma, extranodal and solid organ sites: Secondary | ICD-10-CM

## 2013-06-10 DIAGNOSIS — K08409 Partial loss of teeth, unspecified cause, unspecified class: Secondary | ICD-10-CM

## 2013-06-10 DIAGNOSIS — Z0189 Encounter for other specified special examinations: Secondary | ICD-10-CM

## 2013-06-10 NOTE — Patient Instructions (Signed)
PLAN: 1. Continue salt water rinses every 2 hours while awake to aid healing. 2. Brush tongue daily 3. Maintain adequate nutrition and advance diet as tolerated. 4. Followup with Dr. Gaylyn Rong on Wednesday.  Patient is cleared for chemoradiation therapy starting on 06/13/2013. 5. Will consider starting fabrication of upper and lower complete dentures in 1 month if patient undergoing chemotherapy only followed by radiation therapy in approximately 3 months.  Charlynne Pander, DDS

## 2013-06-10 NOTE — Progress Notes (Signed)
POST OPERATIVE NOTE:  06/10/2013 Gabriel Estrada [id number]  VITALS: BP 110/69  Pulse 96  Temp(Src) 97.7 F (36.5 C) (Oral)  Gabriel Estrada is status post multiple extractions with alveoloplasty and pre-prosthetic surgery as indicated on 05/30/2013 in the operating room.  SUBJECTIVE: Patient currently denies any dental pain or problems. Patient did have some discomfort initially. Patient does have stitches that remain. Patient indicates that her oral bruising is resolving. Patient denies ever having had problems with bleeding. EXAM: No sign of infection, heme, or ooze. Sutures are loosely intact. Generalized primary closure is noted. Mandibular right facial ecchymoses are noted intraorally and extraorally. These ecchymoses appear to be resolving well.  The patient is now edentulous.  Procedure: 30 second chlorhexidine rinse. Sutures were removed without complication.  ASSESSMENT: Post operative course is consistent with dental procedures performed in the operating room.  PLAN: 1. Continue salt water rinses every 2 hours while awake to aid healing. 2. Brush tongue daily 3. Maintain adequate nutrition and advance diet as tolerated. 4. Followup with Dr. Gaylyn Rong on Wednesday.  Patient is cleared for chemoradiation therapy starting on 06/13/2013. 5. Will consider starting fabrication of upper and lower complete dentures in 1 month if patient undergoing chemotherapy only followed by radiation therapy in approximately 3 months.  Charlynne Pander, DDS

## 2013-06-12 ENCOUNTER — Ambulatory Visit (HOSPITAL_BASED_OUTPATIENT_CLINIC_OR_DEPARTMENT_OTHER): Payer: Medicare Other | Admitting: Oncology

## 2013-06-12 ENCOUNTER — Ambulatory Visit: Payer: Medicare Other

## 2013-06-12 ENCOUNTER — Telehealth: Payer: Self-pay | Admitting: *Deleted

## 2013-06-12 ENCOUNTER — Telehealth: Payer: Self-pay | Admitting: Oncology

## 2013-06-12 VITALS — BP 94/53 | HR 98 | Temp 97.6°F | Resp 20 | Ht 72.0 in | Wt 169.2 lb

## 2013-06-12 DIAGNOSIS — R0989 Other specified symptoms and signs involving the circulatory and respiratory systems: Secondary | ICD-10-CM

## 2013-06-12 DIAGNOSIS — N3289 Other specified disorders of bladder: Secondary | ICD-10-CM

## 2013-06-12 DIAGNOSIS — R9430 Abnormal result of cardiovascular function study, unspecified: Secondary | ICD-10-CM

## 2013-06-12 DIAGNOSIS — I509 Heart failure, unspecified: Secondary | ICD-10-CM

## 2013-06-12 DIAGNOSIS — C8589 Other specified types of non-Hodgkin lymphoma, extranodal and solid organ sites: Secondary | ICD-10-CM

## 2013-06-12 NOTE — Progress Notes (Signed)
Patient is scheduled to see Dr. Retta Diones on 06/21/13 @ [facility]

## 2013-06-12 NOTE — Patient Instructions (Addendum)
1.  Diagnosis:  Stage I Diffuse Large B-cell lymphoma. 2.  Treatment:   *  Optimally:  3 cycles of chemo followed by radiation.  Had the cancer be stage III or IV, we would need 6 cycles of chemo and no radiation.   *  Due to concern for congestive heart failure on echo, I recommend MUGA scan to get a more objective assessment of cardiac function.   *  If there is heart failure, then chemo will be R-CEOP as opposed to R-CHOP (replacing Etoposide oral chemo for IV Adriamycin.  The other agents in the regimen are still IV).  3.  Bladder mass:  Most likely incidental early presentation of a bladder cancer.  I recommend referral to Urology for evaluation.  Early bladder cancer is treated simply by simple procedure to remove the bladder mass without any need for chemotherapy or radiation.

## 2013-06-12 NOTE — Telephone Encounter (Signed)
Per staff message and POF I have tried to schedule appts, no room. I have schedule appt for 7/2.  JMW

## 2013-06-12 NOTE — Telephone Encounter (Signed)
Gave pt appt for lab, Md , chemo class, emailed Michelle regarding chemo. Talked to [facility] , pt will be called with appt. Gave referral to HIM and informed Hayes Ludwig that  notes has to indicvate that pt chemo will start on 7/3

## 2013-06-12 NOTE — Progress Notes (Signed)
[facility]  [phone] [phone]   OFFICE PROGRESS NOTE   Cc:  [name], MD  DIAGNOSIS:  Left nose DLBCL; stage I; IPS low risk given age.    PAST THERAPY:  Biopsy only.   CURRENT THERAPY: due to start therapy soon.   INTERVAL HISTORY: Gabriel Estrada 79 y.o. male returns for regular follow up with a sister.  He was having 2nd thought again of chemo for lymphoma.  He is afraid that he will need to use the bathroom too frequently to get chemo.  He still has left nose mass; cannot breath through the left nostril.  He denied pain, bleeding from the site.  He has bone ache from presumed arthritis and needs a cane to steady himself when he ambulates.  He is not very active.  He has been having lower urinary tract symptoms the last few months which have been getting worse.  He has problem initiating urine stream or stopping it.  He cannot empty his bladder completely.  He denied pelvic pain, hematuria, pyuria, fever, groin nodes.  He denied chest pain palpitation, PND, orthopnea, pedal edema.  The rest of the 14-point review of system was negative.     Past Medical History  Diagnosis Date  . Coronary artery disease 03/15/2001    x 1 with MI  . A-fib   . PVD (peripheral vascular disease)   . Hyperlipidemia   . Mitral regurgitation     moderate;  with postinfarction class 4 unstable angina  . Myocardial infarction   . CHF (congestive heart failure)     class 4 congestive heart failure  . Hypertension   . Chronic kidney disease, unspecified   . Cancer     NASAL    Past Surgical History  Procedure Laterality Date  . Coronary artery bypass graft  03/15/01    x 5  . Multiple extractions with alveoloplasty N/A 05/30/2013    Procedure: Extraction of tooth #'s 2,6,7,8,10,11,12,18,20,21,22,23,24,25,26,27,28,29 with alveoloplasty and mandibular tori reductions, and lateral exostoses reductions.;  Surgeon: Charlynne Pander, DDS;  Location: [facility] OR;   Service: Oral Surgery;  Laterality: N/A;    Current Outpatient Prescriptions  Medication Sig Dispense Refill  . carvedilol (COREG) 25 MG tablet Take 25 mg by mouth 2 (two) times daily with a meal.      . digoxin (LANOXIN) 0.125 MG tablet Take 0.125 mg by mouth every morning.      . enalapril (VASOTEC) 10 MG tablet Take 5 mg by mouth every morning.      . ezetimibe-simvastatin (VYTORIN) 10-40 MG per tablet Take 1 tablet by mouth at bedtime.      . lidocaine-prilocaine (EMLA) cream Apply topically as needed.  30 g  2  . ondansetron (ZOFRAN) 8 MG tablet Take 1 tablet (8 mg total) by mouth every 8 (eight) hours as needed for nausea.  20 tablet  0  . oxyCODONE-acetaminophen (PERCOCET) 5-325 MG per tablet Take one or two tablets by mouth every 6 hours as needed for pain.  30 tablet  0  . predniSONE (DELTASONE) 10 MG tablet Take 4 tablets (40 mg total) by mouth daily.  20 tablet  3  . prochlorperazine (COMPAZINE) 10 MG tablet Take 1 tablet (10 mg total) by mouth every 6 (six) hours as needed.  30 tablet  3  . warfarin (COUMADIN) 5 MG tablet 5 mg. Take 5 mg everyday except on Tuesday and Fridays take 2.5mg   No current facility-administered medications for this visit.    ALLERGIES:  has No Known Allergies.  REVIEW OF SYSTEMS:  The rest of the 14-point review of system was negative.   Filed Vitals:   06/12/13 0830  BP: 94/53  Pulse: 98  Temp: 97.6 F (36.4 C)  Resp: 20   Wt Readings from Last 3 Encounters:  06/12/13 169 lb 3.2 oz (76.749 kg)  05/29/13 172 lb 11.2 oz (78.336 kg)  05/29/13 173 lb (78.472 kg)   ECOG Performance status: 1-2  PHYSICAL EXAMINATION:   General:  Thin-appearing man, in no acute distress.  Eyes:  no scleral icterus.  ENT:  There was an obstructing mass in the left nostril with swelling of the left nose bridge.  Neck was without thyromegaly.  Lymphatics:  Negative cervical, supraclavicular or axillary adenopathy.  Respiratory: lungs were clear bilaterally  without wheezing or crackles.  Cardiovascular:  Irregularly iregular rate and rhythm, S1/S2, without murmur, rub or gallop.  There was no pedal edema.  GI:  abdomen was soft, flat, nontender, nondistended, without organomegaly.  Muscoloskeletal:  no spinal tenderness of palpation of vertebral spine.  Skin exam was without echymosis, petichae.  Neuro exam was nonfocal.  Patient was able to get on and off exam table without assistance.  Gait was normal.  Patient was alert and oriented.  Attention was good.   Language was appropriate.  Mood was normal without depression.  Speech was not pressured.  Thought content was not tangential.       LABORATORY/RADIOLOGY DATA:  Lab Results  Component Value Date   WBC 7.6 05/29/2013   HGB 13.4 05/29/2013   HCT 39.5 05/29/2013   PLT 155 05/29/2013   GLUCOSE 98 05/29/2013   CHOL 124 10/11/2012   TRIG 88.0 10/11/2012   HDL 51.70 10/11/2012   LDLCALC 55 10/11/2012   ALKPHOS 75 05/29/2013   ALT 18 05/29/2013   AST 22 05/29/2013   NA 139 05/29/2013   K 5.1 05/29/2013   CL 104 05/29/2013   CREATININE 1.9* 05/29/2013   BUN 44.2* 05/29/2013   CO2 25 05/29/2013   INR 1.17 05/29/2013   RADIOLOGY:  PET scan 6/11/4.  Findings:  4.1 x 1.7 cm left nasal mass (series 2/image 27), corresponding to  known lymphoma, max SUV 11.1.  Associated complete opacification of the left maxillary sinus with  central high density (series 2/image 26), likely reflecting  inspissated material.  Neck: No hypermetabolic lymph nodes in the neck.  Chest: Focal patchy/nodular opacity in the posterior left upper  lobe (series 2/image 83), max SUV 3.2. The appearance is favored  to be infectious/inflammatory.  Underlying moderate to severe centrilobular and paraseptal  emphysematous changes. Dependent atelectasis in the bilateral  upper lobes and left lower lobe. No pleural effusion or  pneumothorax.  No hypermetabolic thoracic lymphadenopathy. Small mediastinal  lymph nodes including a 10 mm short-axis  precarinal node (series  2/image 92) and an 8 mm short-axis subcarinal node (series 2/image  103), non-FDG-avid.  Coronary atherosclerosis. Postsurgical changes related to prior  CABG.  Abdomen/Pelvis: No abnormal hypermetabolic activity within the  liver, pancreas, adrenal glands, or spleen.  Layering gallstones. Bilateral renal vascular calcifications.  Atherosclerotic calcifications of the abdominal aorta and branch  vessels. Prostatomegaly, measuring 5.4 cm in transverse dimension.  2.6 x 1.1 cm mass along the left posterolateral bladder wall  (series 2/image 231), suspicious for primary bladder neoplasm.  No hypermetabolic lymph nodes in the abdomen or pelvis.  Skeleton: No focal hypermetabolic  activity to suggest skeletal  metastasis.  IMPRESSION:  4.1 x 1.7 cm left nasal mass, corresponding to known lymphoma, max  SUV 11.1.  Focal patchy/nodular opacity in the posterior left upper lobe, max  SUV 3.2, favored to be infectious/inflammatory. Follow up to  resolution.  2.6 x 1.1 cm mass along the left posterolateral bladder wall,  suspicious for primary bladder neoplasm. Cystoscopic correlation  is suggested.  These results were called by telephone on 06/05/2013 at Dr. Gaylyn Rong to  1445 hours, who verbally acknowledged these results.  ECHO:  06/05/13  Study Conclusions  - Left ventricle: This study is severely limited. Other sudies would be needed to be sure about LV function/EF. The septum moves. However there is suggestion of significant overall LV dysfunction. EF can not be estimated. The cavity size was normal. Wall thickness was normal. Images were inadequate for LV wall motion assessment. - Aortic valve: Sclerosis without stenosis. - Left atrium: The atrium was mildly dilated. - Right ventricle: Not able to assess RV function due to poor visualization. Not able to assess RV size due to poor visualization.   ASSESSMENT AND PLAN:    1.  Diagnosis:  Stage I Diffuse  Large B-cell lymphoma; locally progressing.  2.  Treatment:   *  Optimally:  3 cycles of chemo followed by radiation.  Had the cancer be stage III or IV, we would need 6 cycles of chemo and no radiation.   *  Due to concern for congestive heart failure on echo, I recommend MUGA scan to get a more objective assessment of cardiac function.  This is surprising since he is asymptomatic.  His last echo in 03/2012 showed 50% EF.   *  If there is heart failure, then chemo will be R-CEOP as opposed to R-CHOP (replacing Etoposide oral chemo for IV Adriamycin.  The other agents in the regimen are still IV).  *  Another alternative option would be just palliative radiation but this is not curative.   3.  Bladder mass:  Most likely incidental early presentation of a bladder cancer.  I recommend referral to Urology for evaluation.  Early bladder cancer is treated simply by simple procedure to remove the bladder mass without any need for chemotherapy or radiation.  He most likely has BPH given his LUTS.  I advised him to talk with Urologist to see if he can get medications for these symptoms.  Hearing this, he feels more inclined to give chemo a try.   4.  Follow up: in about 2 weeks to go over staging info and start chemo at that time.      The length of time of the face-to-face encounter was 25 minutes. More than 50% of time was spent counseling and coordination of care.

## 2013-06-12 NOTE — Telephone Encounter (Signed)
Gave pt appt for 06/27/13 0815 am lab, MD and chemo

## 2013-06-13 ENCOUNTER — Ambulatory Visit: Payer: Medicare Other

## 2013-06-13 ENCOUNTER — Telehealth: Payer: Self-pay | Admitting: Oncology

## 2013-06-13 ENCOUNTER — Other Ambulatory Visit: Payer: Self-pay | Admitting: Radiology

## 2013-06-13 NOTE — Telephone Encounter (Signed)
Talked to pt and gave her appt for 06/26/13 lab, md and chemo

## 2013-06-14 ENCOUNTER — Telehealth: Payer: Self-pay | Admitting: Oncology

## 2013-06-14 ENCOUNTER — Encounter (HOSPITAL_COMMUNITY): Payer: Self-pay | Admitting: Pharmacy Technician

## 2013-06-14 NOTE — Telephone Encounter (Signed)
pt will see urology on 06/21/13 10:45 per pt

## 2013-06-18 ENCOUNTER — Encounter (HOSPITAL_COMMUNITY): Payer: Self-pay

## 2013-06-18 ENCOUNTER — Ambulatory Visit (HOSPITAL_COMMUNITY)
Admission: RE | Admit: 2013-06-18 | Discharge: 2013-06-18 | Disposition: A | Discharge status: Home / Self Care | Payer: Medicare Other | Source: Ambulatory Visit | Attending: Oncology | Admitting: Oncology

## 2013-06-18 ENCOUNTER — Other Ambulatory Visit: Payer: Self-pay | Admitting: Oncology

## 2013-06-18 DIAGNOSIS — I129 Hypertensive chronic kidney disease with stage 1 through stage 4 chronic kidney disease, or unspecified chronic kidney disease: Secondary | ICD-10-CM | POA: Insufficient documentation

## 2013-06-18 DIAGNOSIS — Z951 Presence of aortocoronary bypass graft: Secondary | ICD-10-CM | POA: Insufficient documentation

## 2013-06-18 DIAGNOSIS — C8589 Other specified types of non-Hodgkin lymphoma, extranodal and solid organ sites: Secondary | ICD-10-CM

## 2013-06-18 DIAGNOSIS — E785 Hyperlipidemia, unspecified: Secondary | ICD-10-CM | POA: Insufficient documentation

## 2013-06-18 DIAGNOSIS — I4891 Unspecified atrial fibrillation: Secondary | ICD-10-CM | POA: Insufficient documentation

## 2013-06-18 DIAGNOSIS — I252 Old myocardial infarction: Secondary | ICD-10-CM | POA: Insufficient documentation

## 2013-06-18 DIAGNOSIS — I509 Heart failure, unspecified: Secondary | ICD-10-CM | POA: Insufficient documentation

## 2013-06-18 DIAGNOSIS — Z7901 Long term (current) use of anticoagulants: Secondary | ICD-10-CM | POA: Insufficient documentation

## 2013-06-18 DIAGNOSIS — I739 Peripheral vascular disease, unspecified: Secondary | ICD-10-CM | POA: Insufficient documentation

## 2013-06-18 DIAGNOSIS — I2510 Atherosclerotic heart disease of native coronary artery without angina pectoris: Secondary | ICD-10-CM | POA: Insufficient documentation

## 2013-06-18 DIAGNOSIS — R9430 Abnormal result of cardiovascular function study, unspecified: Secondary | ICD-10-CM

## 2013-06-18 DIAGNOSIS — N189 Chronic kidney disease, unspecified: Secondary | ICD-10-CM | POA: Insufficient documentation

## 2013-06-18 DIAGNOSIS — Z79899 Other long term (current) drug therapy: Secondary | ICD-10-CM | POA: Insufficient documentation

## 2013-06-18 LAB — CBC WITH DIFFERENTIAL/PLATELET
Basophils Relative: 0 % (ref 0–1)
Eosinophils Absolute: 0.1 10*3/uL (ref 0.0–0.7)
Eosinophils Relative: 2 % (ref 0–5)
HCT: 37.6 % — ABNORMAL LOW (ref 39.0–52.0)
Hemoglobin: 12.5 g/dL — ABNORMAL LOW (ref 13.0–17.0)
MCH: 28.5 pg (ref 26.0–34.0)
MCHC: 33.2 g/dL (ref 30.0–36.0)
Monocytes Absolute: 0.8 10*3/uL (ref 0.1–1.0)
Monocytes Relative: 11 % (ref 3–12)

## 2013-06-18 MED ORDER — FENTANYL CITRATE 0.05 MG/ML IJ SOLN
INTRAMUSCULAR | Status: AC | PRN
  Administered 2013-06-18: 100 ug via INTRAVENOUS

## 2013-06-18 MED ORDER — HEPARIN SOD (PORK) LOCK FLUSH 100 UNIT/ML IV SOLN
500.0000 [IU] | Freq: Once | INTRAVENOUS | Status: AC
  Administered 2013-06-18: 500 [IU] via INTRAVENOUS

## 2013-06-18 MED ORDER — SODIUM CHLORIDE 0.9 % IV SOLN
INTRAVENOUS | Status: DC
  Administered 2013-06-18: 08:00:00 via INTRAVENOUS

## 2013-06-18 MED ORDER — FENTANYL CITRATE 0.05 MG/ML IJ SOLN
INTRAMUSCULAR | Status: AC
  Filled 2013-06-18: qty 4

## 2013-06-18 MED ORDER — CEFAZOLIN SODIUM-DEXTROSE 2-3 GM-% IV SOLR
2.0000 g | Freq: Once | INTRAVENOUS | Status: AC
  Administered 2013-06-18: 2 g via INTRAVENOUS
  Filled 2013-06-18: qty 50

## 2013-06-18 MED ORDER — LIDOCAINE HCL 1 % IJ SOLN
INTRAMUSCULAR | Status: AC
  Filled 2013-06-18: qty 20

## 2013-06-18 MED ORDER — MIDAZOLAM HCL 2 MG/2ML IJ SOLN
INTRAMUSCULAR | Status: AC | PRN
  Administered 2013-06-18: 2 mg via INTRAVENOUS

## 2013-06-18 MED ORDER — MIDAZOLAM HCL 2 MG/2ML IJ SOLN
INTRAMUSCULAR | Status: AC
  Filled 2013-06-18: qty 4

## 2013-06-18 NOTE — H&P (Signed)
Chief Complaint: "I'm here for a port" Referring Physician:Ha HPI: Gabriel Estrada is an 79 y.o. male with Lymphoma who will be getting chemotherapy. He is referred for port placement. PMHx and meds reviewed. Afib on Coumadin-has been held several days. CHF, recent Echo with significant LV dysfunction but could not get EF% Pt currently denies CP, SOB  Past Medical History:  Past Medical History  Diagnosis Date  . Coronary artery disease 03/15/2001    x 1 with MI  . A-fib   . PVD (peripheral vascular disease)   . Hyperlipidemia   . Mitral regurgitation     moderate;  with postinfarction class 4 unstable angina  . Myocardial infarction   . CHF (congestive heart failure)     class 4 congestive heart failure  . Hypertension   . Chronic kidney disease, unspecified   . Cancer     NASAL  . Teeth missing     all teeth pulled-06-13-13    Past Surgical History:  Past Surgical History  Procedure Laterality Date  . Coronary artery bypass graft  03/15/01    x 5  . Multiple extractions with alveoloplasty N/A 05/30/2013    Procedure: Extraction of tooth #'s 2,6,7,8,10,11,12,18,20,21,22,23,24,25,26,27,28,29 with alveoloplasty and mandibular tori reductions, and lateral exostoses reductions.;  Surgeon: Charlynne Pander, DDS;  Location: [facility] OR;  Service: Oral Surgery;  Laterality: N/A;    Family History:  Family History  Problem Relation Age of Onset  . Heart attack Mother   . Heart disease Sister   . Heart attack Brother   . Heart attack Brother   . Heart disease Sister   . Heart disease Sister   . Cancer Father     lung?  . Cancer Maternal Aunt     kidney cancer    Social History:  reports that he quit smoking about 2 years ago. He has never used smokeless tobacco. He reports that he does not drink alcohol or use illicit drugs.  Allergies: No Known Allergies  Medications: carvedilol (COREG) 25 MG tablet (Taking) Sig - Route: Take 25 mg by mouth 2 (two) times daily with a meal. -  Oral Class: Historical Med Number of times this order has been changed since signing: 1 Order [address] digoxin (LANOXIN) 0.125 MG tablet (Taking) Sig - Route: Take 0.125 mg by mouth every evening. - Oral Class: Historical Med Number of times this order has been changed since signing: 3 Order [address] enalapril (VASOTEC) 10 MG tablet (Taking) Sig - Route: Take 5 mg by mouth every evening. - Oral Class: Historical Med Number of times this order has been changed since signing: 3 Order [address] ezetimibe-simvastatin (VYTORIN) 10-40 MG per tablet (Taking) Sig - Route: Take 1 tablet by mouth at bedtime. - Oral Class: Historical Med Number of times this order has been changed since signing: 1 Order [address] warfarin (COUMADIN) 5 MG tablet (Taking) 06/02/2013 Sig - Route: Take 2.5-5 mg by mouth daily. Take 5 mg everyday except on Tuesday and Fridays take 2.5mg  - Oral Class: Historical Med Number of times this order has been changed since signing: 2 Order [address] lidocaine-prilocaine (EMLA) cream 30 g 2 05/29/2013 Sig - Route: Apply topically as needed. - Topical Class: Print Number of times this order has been changed since signing: 1 Order [address] ondansetron (ZOFRAN) 8 MG tablet 20 tablet 0 05/29/2013 Sig - Route: Take 1 tablet (8 mg total) by mouth every 8 (eight) hours as needed for nausea. - Oral Class:  Print Number of times this order has been changed since signing: 1 Order [address] oxyCODONE-acetaminophen (PERCOCET) 5-325 MG per tablet 30 tablet 0 05/30/2013 Sig: Take one or two tablets by mouth every 6 hours as needed for pain. Class: Print Number of times this order has been changed since signing: 1 Order [address] predniSONE (DELTASONE) 10 MG tablet 20 tablet 3 05/29/2013 Sig - Route: Take 4 tablets (40 mg total) by mouth daily. - Oral Class: Print Notes to Pharmacy: Prednisone 40mg  PO qam on days 1 through 5 of each chemo cycle. Number of times this order has been changed since signing: 1 Order  [address] prochlorperazine (COMPAZINE) 10 MG tablet 30 tablet 3 05/29/2013 Sig - Route: Take 1 tablet (10 mg total) by mouth every 6 (six) hours as needed   Please HPI for pertinent positives, otherwise complete 10 system ROS negative.  Physical Exam: BP 134/72  Pulse 101  Temp(Src) 96.5 F (35.8 C) (Oral)  Resp 18  SpO2 93% There is no weight on file to calculate BMI.   General Appearance:  Alert, cooperative, no distress, appears stated age  Head:  Normocephalic, without obvious abnormality, atraumatic  ENT: Unremarkable  Neck: Supple, symmetrical, trachea midline  Lungs:   Clear to auscultation bilaterally, no w/r/r, respirations unlabored without use of accessory muscles.  Chest Wall:  No tenderness or deformity  Heart:  Regular rate and rhythm, S1, S2 normal, no murmur, rub or gallop.  Neurologic: Normal affect, no gross deficits.   Results for orders placed during the hospital encounter of 06/18/13 (from the past 48 hour(s))  APTT     Status: None   Collection Time    06/18/13  7:40 AM      Result Value Range   aPTT 35  24 - 37 seconds  CBC WITH DIFFERENTIAL     Status: Abnormal   Collection Time    06/18/13  7:40 AM      Result Value Range   WBC 6.9  4.0 - 10.5 K/uL   RBC 4.38  4.22 - 5.81 MIL/uL   Hemoglobin 12.5 (*) 13.0 - 17.0 g/dL   HCT 16.1 (*) 09.6 - 04.5 %   MCV 85.8  78.0 - 100.0 fL   MCH 28.5  26.0 - 34.0 pg   MCHC 33.2  30.0 - 36.0 g/dL   RDW 40.9  81.1 - 91.4 %   Platelets 170  150 - 400 K/uL   Neutrophils Relative % 74  43 - 77 %   Neutro Abs 5.1  1.7 - 7.7 K/uL   Lymphocytes Relative 12  12 - 46 %   Lymphs Abs 0.8  0.7 - 4.0 K/uL   Monocytes Relative 11  3 - 12 %   Monocytes Absolute 0.8  0.1 - 1.0 K/uL   Eosinophils Relative 2  0 - 5 %   Eosinophils Absolute 0.1  0.0 - 0.7 K/uL   Basophils Relative 0  0 - 1 %   Basophils Absolute 0.0  0.0 - 0.1 K/uL  PROTIME-INR     Status: None   Collection Time    06/18/13  7:40 AM      Result Value Range    Prothrombin Time 14.4  11.6 - 15.2 seconds   INR 1.14  0.00 - 1.49   No results found.  Assessment/Plan Lymphoma For Port placement Discussed procedure, risks, complications, use of sedation. Labs reviewed, ok Consent signed in chart  Brayton El PA-C 06/18/2013, 9:02 AM

## 2013-06-18 NOTE — Procedures (Signed)
Interventional Radiology Procedure Note  Procedure: Placement of a right IJ approach single lumen PowerPort.  Tip is positioned at the superior cavoatrial junction and catheter is ready for immediate use.  Complications: No immediate Recommendations:  - Ok to shower tomorrow - Do not submerge for 7 days - Routine line care   Signed,  Tayvon Culley K. Vaishali Baise, MD Vascular & Interventional Radiologist [facility]  

## 2013-06-18 NOTE — H&P (Signed)
Agree with PA note.    Signed,  Heath K. McCullough, MD Vascular & Interventional Radiologist [facility]  

## 2013-06-19 ENCOUNTER — Encounter (HOSPITAL_COMMUNITY): Payer: Self-pay

## 2013-06-19 ENCOUNTER — Encounter (HOSPITAL_COMMUNITY)
Admission: RE | Admit: 2013-06-19 | Discharge: 2013-06-19 | Disposition: A | Discharge status: Home / Self Care | Payer: Medicare Other | Source: Ambulatory Visit | Attending: Oncology | Admitting: Oncology

## 2013-06-19 DIAGNOSIS — I509 Heart failure, unspecified: Secondary | ICD-10-CM | POA: Insufficient documentation

## 2013-06-19 DIAGNOSIS — R9430 Abnormal result of cardiovascular function study, unspecified: Secondary | ICD-10-CM

## 2013-06-19 DIAGNOSIS — C8589 Other specified types of non-Hodgkin lymphoma, extranodal and solid organ sites: Secondary | ICD-10-CM | POA: Insufficient documentation

## 2013-06-19 DIAGNOSIS — Z01818 Encounter for other preprocedural examination: Secondary | ICD-10-CM | POA: Insufficient documentation

## 2013-06-19 MED ORDER — TECHNETIUM TC 99M-LABELED RED BLOOD CELLS IV KIT
24.0000 | PACK | Freq: Once | INTRAVENOUS | Status: AC | PRN
  Administered 2013-06-19: 24 via INTRAVENOUS

## 2013-06-20 ENCOUNTER — Encounter: Payer: Self-pay | Admitting: *Deleted

## 2013-06-20 ENCOUNTER — Other Ambulatory Visit: Payer: Medicare Other

## 2013-06-21 ENCOUNTER — Telehealth: Payer: Self-pay | Admitting: *Deleted

## 2013-06-21 NOTE — Telephone Encounter (Signed)
Left message that he has confirmed bladder cancer. Needs to talk with MD about when he can take him to OR in regards to his chemo treatment. Forwarded message to Dr. Gaylyn Rong via [place].

## 2013-06-22 ENCOUNTER — Encounter: Payer: Self-pay | Admitting: Radiation Oncology

## 2013-06-22 NOTE — Progress Notes (Signed)
Lymphoma Location(s) / Histology: Diagnosis 1. Nasal mucosa, biopsy, left LARGE B CELL LYMPHOMA 2. Soft tissue mass, biopsy, left nasal mass LARGE B CELL LYMPHOMA.  Past/Anticipated interventions by medical oncology, if any: His last echo in 03/2012 showed 50% EF.  If there is heart failure, then chemo will be R-CEOP as opposed to R-CHOP (replacing Etoposide oral chemo for IV Adriamycin.   Decision for treatment with R-CHOP to start tomorrow.  06/19/13 Echo demonstrates : Markedly decreased left ventricular ejection fraction of 27% vs. 50% 03/2012. Global hypokinesia of the left ventricle greatest at apex.  Weight changes, if any, over the past 6 months: None  Recurrent fevers, or drenching night sweats, if any: No  SAFETY ISSUES:  Prior radiation? No  Pacemaker/ICD? No  Possible current pregnancy? No  Is the patient on methotrexate? No  Current Complaints / other details:"He has been having lower urinary tract symptoms the last few months which have been getting worse. He has problem initiating urine stream or stopping it. He cannot empty his bladder completely. He denied pelvic pain, hematuria, pyuria, fever, groin nodes."  He has a bladder mass and will be referred to urology per Dr. Gaylyn Rong.  Will have surgery by Dr. Heloise Purpura on 07/08/13.  Left nares - congested

## 2013-06-24 ENCOUNTER — Other Ambulatory Visit: Payer: Self-pay | Admitting: Urology

## 2013-06-25 ENCOUNTER — Ambulatory Visit
Admission: RE | Admit: 2013-06-25 | Discharge: 2013-06-25 | Disposition: A | Discharge status: Home / Self Care | Payer: Medicare Other | Source: Ambulatory Visit | Attending: Radiation Oncology | Admitting: Radiation Oncology

## 2013-06-25 ENCOUNTER — Other Ambulatory Visit: Payer: Self-pay | Admitting: Oncology

## 2013-06-25 ENCOUNTER — Encounter: Payer: Self-pay | Admitting: Radiation Oncology

## 2013-06-25 VITALS — BP 107/65 | HR 99 | Temp 94.9°F | Ht 72.0 in | Wt 171.8 lb

## 2013-06-25 DIAGNOSIS — C8589 Other specified types of non-Hodgkin lymphoma, extranodal and solid organ sites: Secondary | ICD-10-CM

## 2013-06-25 DIAGNOSIS — I700 Atherosclerosis of aorta: Secondary | ICD-10-CM | POA: Insufficient documentation

## 2013-06-25 DIAGNOSIS — C8581 Other specified types of non-Hodgkin lymphoma, lymph nodes of head, face, and neck: Secondary | ICD-10-CM | POA: Insufficient documentation

## 2013-06-25 DIAGNOSIS — I2510 Atherosclerotic heart disease of native coronary artery without angina pectoris: Secondary | ICD-10-CM | POA: Insufficient documentation

## 2013-06-25 DIAGNOSIS — Z79899 Other long term (current) drug therapy: Secondary | ICD-10-CM | POA: Insufficient documentation

## 2013-06-25 DIAGNOSIS — N329 Bladder disorder, unspecified: Secondary | ICD-10-CM | POA: Insufficient documentation

## 2013-06-25 HISTORY — DX: Non-Hodgkin lymphoma, unspecified, unspecified site: C85.90

## 2013-06-26 ENCOUNTER — Encounter (HOSPITAL_COMMUNITY): Payer: Self-pay | Admitting: Pharmacy Technician

## 2013-06-26 ENCOUNTER — Other Ambulatory Visit (HOSPITAL_BASED_OUTPATIENT_CLINIC_OR_DEPARTMENT_OTHER): Payer: Medicare Other | Admitting: Lab

## 2013-06-26 ENCOUNTER — Ambulatory Visit (HOSPITAL_BASED_OUTPATIENT_CLINIC_OR_DEPARTMENT_OTHER): Payer: Medicare Other | Admitting: Oncology

## 2013-06-26 ENCOUNTER — Telehealth: Payer: Self-pay | Admitting: Oncology

## 2013-06-26 ENCOUNTER — Encounter: Payer: Self-pay | Admitting: Radiation Oncology

## 2013-06-26 ENCOUNTER — Ambulatory Visit: Payer: Medicare Other

## 2013-06-26 VITALS — BP 113/67 | HR 100 | Temp 97.6°F | Resp 20 | Ht 72.0 in | Wt 173.0 lb

## 2013-06-26 DIAGNOSIS — C8589 Other specified types of non-Hodgkin lymphoma, extranodal and solid organ sites: Secondary | ICD-10-CM

## 2013-06-26 LAB — CBC WITH DIFFERENTIAL/PLATELET
BASO%: 0.5 % (ref 0.0–2.0)
EOS%: 2.7 % (ref 0.0–7.0)
HCT: 35.7 % — ABNORMAL LOW (ref 38.4–49.9)
LYMPH%: 14.9 % (ref 14.0–49.0)
MCH: 29.3 pg (ref 27.2–33.4)
MCHC: 32.8 g/dL (ref 32.0–36.0)
NEUT%: 67.7 % (ref 39.0–75.0)
Platelets: 133 10*3/uL — ABNORMAL LOW (ref 140–400)

## 2013-06-26 MED ORDER — PREDNISONE 10 MG PO TABS: 10.0000 mg | ORAL_TABLET | Freq: Every day | ORAL | Status: DC

## 2013-06-26 NOTE — Progress Notes (Signed)
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    PAST THERAPY:  Biopsy only.   CURRENT THERAPY: due to start therapy soon.   INTERVAL HISTORY: Gabriel Estrada 79 y.o. male returns for regular follow up with his son.  He still has left nasal mass.  He cannot breath from the left side of his nose.  He denied pain, bleeding from this mass.  He denied lymph node swelling anywhere else.  He still has LUTS with slow urine stream.  He denied hematuria.  He has mild bilateral lower extremity swelling but denied chest pain, DOE, SOB.  His strength is stable.  He is still independent of personal hygiene activities; but he does not do chores around the house.     Past Medical History  Diagnosis Date  . Coronary artery disease 03/15/2001    x 1 with MI  . A-fib   . PVD (peripheral vascular disease)   . Hyperlipidemia   . Mitral regurgitation     moderate;  with postinfarction class 4 unstable angina  . Myocardial infarction   . CHF (congestive heart failure)     class 4 congestive heart failure  . Hypertension   . Chronic kidney disease, unspecified   . Teeth missing     all teeth pulled-06-13-13  . Lymphoma     Nasal Mass    Past Surgical History  Procedure Laterality Date  . Coronary artery bypass graft  03/15/01    x 5  . Multiple extractions with alveoloplasty N/A 05/30/2013    Procedure: Extraction of tooth #'s 2,6,7,8,10,11,12,18,20,21,22,23,24,25,26,27,28,29 with alveoloplasty and mandibular tori reductions, and lateral exostoses reductions.;  Surgeon: Charlynne Pander, DDS;  Location: [facility] OR;  Service: Oral Surgery;  Laterality: N/A;  . Soft tissue biopsy       Diffuse B-Cell Lynphoma/ Nasal mucosa, biopsy, left,left nasal mass    Current Outpatient Prescriptions  Medication Sig Dispense Refill  . carvedilol (COREG) 25 MG tablet Take 25  mg by mouth 2 (two) times daily with a meal.      . digoxin (LANOXIN) 0.125 MG tablet Take 0.125 mg by mouth every evening.       . enalapril (VASOTEC) 10 MG tablet Take 5 mg by mouth every evening.       . ezetimibe-simvastatin (VYTORIN) 10-40 MG per tablet Take 1 tablet by mouth at bedtime.      . lidocaine-prilocaine (EMLA) cream Apply topically as needed.  30 g  2  . Multiple Vitamin (MULTIVITAMIN WITH MINERALS) TABS Take 1 tablet by mouth daily.      . ondansetron (ZOFRAN) 8 MG tablet Take 1 tablet (8 mg total) by mouth every 8 (eight) hours as needed for nausea.  20 tablet  0  . oxyCODONE-acetaminophen (PERCOCET) 5-325 MG per tablet Take one or two tablets by mouth every 6 hours as needed for pain.  30 tablet  0  . predniSONE (DELTASONE) 10 MG tablet Take 1 tablet (10 mg total) by mouth daily.  32 tablet  0  . prochlorperazine (COMPAZINE) 10 MG tablet Take 1 tablet (10 mg total) by mouth every 6 (six) hours as needed.  30 tablet  3  . warfarin (COUMADIN) 5 MG tablet Take 2.5-5 mg by mouth daily. Take 5 mg everyday except on Tuesday and Fridays take 2.5mg   No current facility-administered medications for this visit.    ALLERGIES:  has No Known Allergies.  REVIEW OF SYSTEMS:  The rest of the 14-point review of system was negative.   Filed Vitals:   06/26/13 0803  BP: 113/67  Pulse: 100  Temp: 97.6 F (36.4 C)  Resp: 20   Wt Readings from Last 3 Encounters:  06/26/13 173 lb (78.472 kg)  06/25/13 171 lb 12.8 oz (77.928 kg)  06/12/13 169 lb 3.2 oz (76.749 kg)   ECOG Performance status: 1-2  PHYSICAL EXAMINATION:   General:  Thin-appearing man, in no acute distress.  Eyes:  no scleral icterus.  ENT:  There was an obstructing mass in the left nostril with swelling of the left nose bridge.  Neck was without thyromegaly.  Lymphatics:  Negative cervical, supraclavicular or axillary adenopathy.  Respiratory: lungs were clear bilaterally without wheezing or crackles.   Cardiovascular:  Irregularly iregular rate and rhythm, S1/S2, without murmur, rub or gallop.  There was no pedal edema.  GI:  abdomen was soft, flat, nontender, nondistended, without organomegaly.  Muscoloskeletal:  no spinal tenderness of palpation of vertebral spine.  Skin exam was without echymosis, petichae.  Neuro exam was nonfocal.  Patient was able to get on and off exam table without assistance.  Gait was normal.  Patient was alert and oriented.  Attention was good.   Language was appropriate.  Mood was normal without depression.  Speech was not pressured.  Thought content was not tangential.       LABORATORY/RADIOLOGY DATA:  Lab Results  Component Value Date   WBC 5.5 06/26/2013   HGB 11.7* 06/26/2013   HCT 35.7* 06/26/2013   PLT 133* 06/26/2013   GLUCOSE 98 05/29/2013   CHOL 124 10/11/2012   TRIG 88.0 10/11/2012   HDL 51.70 10/11/2012   LDLCALC 55 10/11/2012   ALKPHOS 75 05/29/2013   ALT 18 05/29/2013   AST 22 05/29/2013   NA 139 05/29/2013   K 5.1 05/29/2013   CL 104 05/29/2013   CREATININE 1.9* 05/29/2013   BUN 44.2* 05/29/2013   CO2 25 05/29/2013   INR 1.14 06/18/2013    ASSESSMENT AND PLAN:    1.  Diagnosis:  Stage I Diffuse Large B-cell lymphoma; locally progressing.  - Concurrent diagnosis of nasal lymphoma and bladder cancer. - Need to treat bladder cancer first since chemo for lymphoma can result in bleeding from bladder cancer. - Temporary treatment options for lymphoma:  *  Prednisone taper course:  40mg  once daily by mouth x 3 days; then 30mg  once daily by mouth x 3 days; then 20mg  once daily by mouth x 3 days; then 10mg  once daily by mouth x 3 days; then 5mg  once daily by mouth x 3 days; then OFF.   Daily weight.  If weight continues to increase drastically, we need to STOP Prednisone.   *  Consideration to receive palliative radiation to the nasal mass while awaiting chemo.  I discussed this option with Dr. Basilio Cairo.  She preferred not to start this now since he will be in the middle of  radiation when he has bladder surgery on 07/08/13.   *  Return to clinic after bladder surgery to consider starting chemo.   - Issues with chemo:  R-CHOP:  Rituxan/ Cytoxan/ Adriamycin/ Vincristine/ Prednisone.   *  His ejection fraction is only in the 27% indicating severe congestive heart failure.  *  If chemo is considered, we should not use Adriamycin since it can cause  worsening heart failure.  *  Option then would be R-CVP (without Adriamycin) or substituting Adriamycin for Etoposide.    2.  Bladder mass:  Presumed cancer per gross appearance on cystoscopy.  Dr. Crecencio Mc is planning for surgery on 07/08/13.   3.  Follow up: in about 3 weeks.   I informed Mr. Quesinberry and his son that I am leaving the practice.  [facility] will [facility] for him to see another provider when he returns.      The length of time of the face-to-face encounter was 25 minutes. More than 50% of time was spent counseling and coordination of care.

## 2013-06-26 NOTE — Telephone Encounter (Signed)
Pt will called , Dr. Gaylyn Rong template not finish

## 2013-06-26 NOTE — Addendum Note (Signed)
Encounter addended by: Delynn Flavin, RN on: 06/26/2013  4:04 PM<BR>     Documentation filed: Charges VN

## 2013-06-26 NOTE — Progress Notes (Signed)
Radiation Oncology         [phone] ________________________________  Name: Gabriel Estrada MRN: [id number]  Date: 06/25/2013  DOB: 23-Jun-1928  Follow-Up Visit Note  Outpatient  CC: [name], MD  Exie Parody, MD  Diagnosis and Prior Radiotherapy:  Stage IA  Diffuse large B cell Non Hodgkin's Lymphoma, left nasal cavity.  Narrative:  The patient returns today for routine follow-up.  He is doing well.  Completed his dental extractions. Has decided to pursue systemic therapy. This will start in the near future.     He has been having lower urinary tract symptoms in the last few months with difficulty initiating his urine stream or stopping it. He has difficulty with complete voiding. He had a bladder mass that was incidentally found on PET scan -- 2.6 x 1.1 cm mass along the left posterolateral bladder wall, suspicious for primary bladder neoplasm --- and a referral has been made to Dr. Laverle Patter of urology.  ALLERGIES:  has No Known Allergies.  Meds: Current Outpatient Prescriptions  Medication Sig Dispense Refill  . carvedilol (COREG) 25 MG tablet Take 25 mg by mouth 2 (two) times daily with a meal.      . digoxin (LANOXIN) 0.125 MG tablet Take 0.125 mg by mouth every evening.       . enalapril (VASOTEC) 10 MG tablet Take 5 mg by mouth every evening.       . ezetimibe-simvastatin (VYTORIN) 10-40 MG per tablet Take 1 tablet by mouth at bedtime.      . lidocaine-prilocaine (EMLA) cream Apply topically as needed.  30 g  2  . ondansetron (ZOFRAN) 8 MG tablet Take 1 tablet (8 mg total) by mouth every 8 (eight) hours as needed for nausea.  20 tablet  0  . oxyCODONE-acetaminophen (PERCOCET) 5-325 MG per tablet Take one or two tablets by mouth every 6 hours as needed for pain.  30 tablet  0  . predniSONE (DELTASONE) 10 MG tablet Take 1 tablet (10 mg total) by mouth daily.  32 tablet  0  . prochlorperazine (COMPAZINE) 10 MG tablet Take 1 tablet (10 mg total) by mouth every 6 (six) hours as  needed.  30 tablet  3  . warfarin (COUMADIN) 5 MG tablet Take 2.5-5 mg by mouth daily. Take 5 mg everyday except on Tuesday and Fridays take 2.5mg        No current facility-administered medications for this encounter.    Physical Findings: The patient is in no acute distress. Patient is alert and oriented.  height is 6' (1.829 m) and weight is 171 lb 12.8 oz (77.928 kg). His temperature is 94.9 F (34.9 C). His blood pressure is 107/65 and his pulse is 99. His oxygen saturation is 95%. [name]  He continues to have a boggy / swollen mass in the left nasal cavity. Status post complete dental extractions   Lab Findings: Lab Results  Component Value Date   WBC 5.5 06/26/2013   HGB 11.7* 06/26/2013   HCT 35.7* 06/26/2013   MCV 89.3 06/26/2013   PLT 133* 06/26/2013    Radiographic Findings: Dg Chest 2 View  05/29/2013   *RADIOLOGY REPORT*  Clinical Data: Preop for dental extraction was, shortness of breath, on medication for hypertension, former smoking history  CHEST - 2 VIEW  Comparison: None.  Findings: The lungs are hyperaerated consistent with emphysema.  No focal infiltrate or effusion is seen.  Mediastinal contours appear normal.  Heart is mildly enlarged.  Median sternotomy sutures are  noted from prior CABG.  No acute bony abnormality is seen.  IMPRESSION: Emphysema.  Cardiomegaly.  No active lung disease.   Original Report Authenticated By: Dwyane Dee, M.D.   Nm Cardiac Muga Rest  06/19/2013   *RADIOLOGY REPORT*  Clinical Data: Lymphoma, pre cardia toxic chemotherapy  NUCLEAR MEDICINE CARDIAC MULTIPLE UPTAKE GATED ACQUISITION SCAN (CARDIAC MUGA)  Technique: Radiolabeled red blood cells used to perform resting radionuclide ventriculography. Imaging performed in the anterior, LAO, and lateral projections.  Resting left ventricular ejection fraction estimated after drawing region of interest curves around the left ventricle during systole and diastole.  Radiopharmaceutical: 25 mCi Tc-22m pertechnetate  labeled autologous red cells  Comparison: None  Findings: Left ventricular ejection fraction is calculated at 27%, well below the normal range. Acquisition performed at a cardiac rate of 96 beats per minute. Wall motion analysis and three projections shows upper normal left ventricular size with global hypokinesia greatest at apex.  IMPRESSION: Markedly decreased left ventricular ejection fraction of 27%. Global hypokinesia of the left ventricle greatest at apex.   Original Report Authenticated By: Ulyses Southward, M.D.   Nm Pet Image Initial (pi) Skull Base To Thigh  06/05/2013   *RADIOLOGY REPORT*  Clinical Data: Initial treatment strategy for lymphoma.  NUCLEAR MEDICINE PET SKULL BASE TO THIGH  Fasting Blood Glucose:  108  Technique:  20.9 mCi F-18 FDG was injected intravenously. CT data was obtained and used for attenuation correction and anatomic localization only.  (This was not acquired as a diagnostic CT examination.) Additional exam technical data entered on technologist worksheet.  Comparison:  Limited sinus CT dated 05/01/2013  Findings:  4.1 x 1.7 cm left nasal mass (series 2/image 27), corresponding to known lymphoma, max SUV 11.1.  Associated complete opacification of the left maxillary sinus with central high density (series 2/image 26), likely reflecting inspissated material.  Neck: No hypermetabolic lymph nodes in the neck.  Chest:  Focal patchy/nodular opacity in the posterior left upper lobe (series 2/image 83), max SUV 3.2.  The appearance is favored to be infectious/inflammatory.  Underlying moderate to severe centrilobular and paraseptal emphysematous changes.  Dependent atelectasis in the bilateral upper lobes and left lower lobe. No pleural effusion or pneumothorax.  No hypermetabolic thoracic lymphadenopathy.  Small mediastinal lymph nodes including a 10 mm short-axis precarinal node (series 2/image 92) and an 8 mm short-axis subcarinal node (series 2/image 103), non-FDG-avid.  Coronary  atherosclerosis. Postsurgical changes related to prior CABG.  Abdomen/Pelvis:  No abnormal hypermetabolic activity within the liver, pancreas, adrenal glands, or spleen.  Layering gallstones.  Bilateral renal vascular calcifications. Atherosclerotic calcifications of the abdominal aorta and branch vessels.  Prostatomegaly, measuring 5.4 cm in transverse dimension.  2.6 x 1.1 cm mass along the left posterolateral bladder wall (series 2/image 231), suspicious for primary bladder neoplasm.  No hypermetabolic lymph nodes in the abdomen or pelvis.  Skeleton:  No focal hypermetabolic activity to suggest skeletal metastasis.  IMPRESSION: 4.1 x 1.7 cm left nasal mass, corresponding to known lymphoma, max SUV 11.1.  Focal patchy/nodular opacity in the posterior left upper lobe, max SUV 3.2, favored to be infectious/inflammatory.  Follow up to resolution.  2.6 x 1.1 cm mass along the left posterolateral bladder wall, suspicious for primary bladder neoplasm.  Cystoscopic correlation is suggested.  These results were called by telephone on 06/05/2013 at Dr. Gaylyn Rong to 1445 hours, who verbally acknowledged these results.   Original Report Authenticated By: Charline Bills, M.D.   Ir Fluoro Guide Cv Line Right  06/18/2013   *  RADIOLOGY REPORT*  TUNNELED CENTRAL VENOUS CATHETER WITH SUBCUTANEOUS RESERVOIR (PORTACATH) PLACEMENT WITH ULTRASOUND AND FLUOROSCOPIC  GUIDANCE  Date: Was 06/18/2013  Clinical History: 79 year old male with left nasal lymphoma.  He requires durable central venous access for chemotherapy.  Sedation: Moderate (conscious) sedation was administered during this procedure.  A total of twomg Versed and 100mg  Fentanyl were administered intravenously.  The patient's vital signs were monitored continuously by radiology nursing throughout the course of the procedure.  Total sedation time: 25 minutes  Fluoroscopy Time: 24 seconds  Procedure:  The right neck and chest was prepped with chlorhexidine, and draped in the  usual sterile fashion using maximum barrier technique (cap and mask, sterile gown, sterile gloves, large sterile sheet, hand hygiene and cutaneous antiseptic).  Antibiotic prophylaxis was provided with 2g Ancef administered IV one hour prior to skin incision.  Local anesthesia was attained by infiltration with 1% lidocaine with epinephrine.  Ultrasound demonstrated patency of the right internal jugular vein, and this was documented with an image.  Under real-time ultrasound guidance, this vein was accessed with a 21 gauge micropuncture needle and image documentation was performed.  A small dermatotomy was made at the access site with an 11 scalpel.  A 0.018" wire was advanced into the SVC and the access needle exchanged for a 1F micropuncture vascular sheath.  The 0.018" wire was then removed and a 0.035" wire advanced into the IVC.  An appropriate location for the subcutaneous reservoir was selected below the clavicle and an incision was made through the skin and underlying soft tissues.  The subcutaneous tissues were then dissected using a combination of blunt and sharp surgical technique and a pocket was formed.  A single lumen power injectable portacatheter was then tunneled through the subcutaneous tissues from the pocket to the dermatotomy and the port reservoir placed within the subcutaneous pocket.  The venous access site was then serially dilated and a peel away vascular sheath placed over the wire.  The wire was removed and the port catheter advanced into position under fluoroscopic guidance. The catheter tip is positioned in the superior cavoatrial junction. This was documented with a spot image. The portacatheter was then tested and found to flush and aspirate well.  The port was flushed with saline followed by 100 units/mL heparinized saline.  The pocket was then closed in two layers using first subdermal inverted interrupted absorbable sutures followed by a running subcuticular suture.  The epidermis  was then sealed with Dermabond. The dermatotomy at the venous access site was also closed with a single inverted subdermal suture and the epidermis sealed with Dermabond.  Complications:  None.  The patient tolerated the procedure well.  IMPRESSION:  Successful placement of a right IJapproach PowerPort with ultrasound and fluoroscopic guidance.  The catheter is ready for use.  Signed,  Sterling Big, MD Vascular & Interventional Radiologist [facility]   Original Report Authenticated By: Malachy Moan, M.D.   Ir US Guide Vasc Access Right  06/18/2013   *RADIOLOGY REPORT*  TUNNELED CENTRAL VENOUS CATHETER WITH SUBCUTANEOUS RESERVOIR (PORTACATH) PLACEMENT WITH ULTRASOUND AND FLUOROSCOPIC  GUIDANCE  Date: Was 06/18/2013  Clinical History: 79 year old male with left nasal lymphoma.  He requires durable central venous access for chemotherapy.  Sedation: Moderate (conscious) sedation was administered during this procedure.  A total of twomg Versed and 100mg  Fentanyl were administered intravenously.  The patient's vital signs were monitored continuously by radiology nursing throughout the course of the procedure.  Total sedation time: 25 minutes  Fluoroscopy Time: 24 seconds  Procedure:  The right neck and chest was prepped with chlorhexidine, and draped in the usual sterile fashion using maximum barrier technique (cap and mask, sterile gown, sterile gloves, large sterile sheet, hand hygiene and cutaneous antiseptic).  Antibiotic prophylaxis was provided with 2g Ancef administered IV one hour prior to skin incision.  Local anesthesia was attained by infiltration with 1% lidocaine with epinephrine.  Ultrasound demonstrated patency of the right internal jugular vein, and this was documented with an image.  Under real-time ultrasound guidance, this vein was accessed with a 21 gauge micropuncture needle and image documentation was performed.  A small dermatotomy was made at the access site with an 11  scalpel.  A 0.018" wire was advanced into the SVC and the access needle exchanged for a 56F micropuncture vascular sheath.  The 0.018" wire was then removed and a 0.035" wire advanced into the IVC.  An appropriate location for the subcutaneous reservoir was selected below the clavicle and an incision was made through the skin and underlying soft tissues.  The subcutaneous tissues were then dissected using a combination of blunt and sharp surgical technique and a pocket was formed.  A single lumen power injectable portacatheter was then tunneled through the subcutaneous tissues from the pocket to the dermatotomy and the port reservoir placed within the subcutaneous pocket.  The venous access site was then serially dilated and a peel away vascular sheath placed over the wire.  The wire was removed and the port catheter advanced into position under fluoroscopic guidance. The catheter tip is positioned in the superior cavoatrial junction. This was documented with a spot image. The portacatheter was then tested and found to flush and aspirate well.  The port was flushed with saline followed by 100 units/mL heparinized saline.  The pocket was then closed in two layers using first subdermal inverted interrupted absorbable sutures followed by a running subcuticular suture.  The epidermis was then sealed with Dermabond. The dermatotomy at the venous access site was also closed with a single inverted subdermal suture and the epidermis sealed with Dermabond.  Complications:  None.  The patient tolerated the procedure well.  IMPRESSION:  Successful placement of a right IJapproach PowerPort with ultrasound and fluoroscopic guidance.  The catheter is ready for use.  Signed,  Sterling Big, MD Vascular & Interventional Radiologist [facility]   Original Report Authenticated By: Malachy Moan, M.D.    Impression/Plan: I concur with plans for the patient to proceed with chemotherapy. I explained to him that for  lymphoma, chemotherapy and radiation are given sequentially. Therefore, I will hold off on radiotherapy until he has completed his chemotherapy. I would appreciate the patient be referred back to me after he has completed his chemotherapy.  Concur with plans to see urology for incidental bladder mass.  I spent 10 minutes minutes face to face with the patient and more than 50% of that time was spent in counseling and/or coordination of care. _____________________________________   Lonie Peak, MD

## 2013-06-26 NOTE — Patient Instructions (Addendum)
1. Issue:  Concurrent diagnosis of nasal lymphoma and bladder cancer. 2. Need to treat bladder cancer first since chemo for lymphoma can result in bleeding from bladder cancer. 3. Temporary treatment options for lymphoma:  *  Prednisone taper course:  40mg  once daily by mouth x 3 days; then 30mg  once daily by mouth x 3 days; then 20mg  once daily by mouth x 3 days; then 10mg  once daily by mouth x 3 days; then 5mg  once daily by mouth x 3 days; then OFF.   Daily weight.  If weight continues to increase drastically, we need to STOP Prednisone.   *  Consideration to receive palliative radiation to the nasal mass while awaiting chemo.  *  Return to clinic after bladder surgery to consider starting chemo.   4.  Issues with chemo:  R-CHOP:  Rituxan/ Cytoxan/ Adriamycin/ Vincristine/ Prednisone.   *   Your ejection fraction is only in the 20% range indicating severe congestive heart failure.  *  If chemo is considered, we should not use Adriamycin since it can cause worsening heart failure.  *  Option then would be R-CVP (without Adriamycin) or substituting Adriamycin for Etoposide.    5. Follow up:  In about 3 weeks.

## 2013-06-27 ENCOUNTER — Other Ambulatory Visit: Payer: Self-pay | Admitting: Cardiology

## 2013-06-27 ENCOUNTER — Other Ambulatory Visit: Payer: Self-pay | Admitting: Lab

## 2013-06-27 ENCOUNTER — Ambulatory Visit: Payer: Self-pay | Admitting: Oncology

## 2013-06-27 ENCOUNTER — Ambulatory Visit: Payer: Medicare Other

## 2013-07-02 NOTE — Pre-Procedure Instructions (Addendum)
07-03-13 EKG 1'14/ CXR 6'14- Epic. 07-03-13 1550 critical lab value called to chasity Little,please advise of any further actions. 07-04-13 1600 call from Chasity of Dr. Vevelyn Royals office-spoke with pt. To advise to avoid high potassium foods-return to his office 07-05-13 to get repeat Potassium. W. Kennon Portela

## 2013-07-03 ENCOUNTER — Ambulatory Visit: Payer: Medicare Other

## 2013-07-03 ENCOUNTER — Encounter (HOSPITAL_COMMUNITY)
Admission: RE | Admit: 2013-07-03 | Discharge: 2013-07-03 | Disposition: A | Discharge status: Home / Self Care | Payer: Medicare Other | Source: Ambulatory Visit | Attending: Urology | Admitting: Urology

## 2013-07-03 ENCOUNTER — Encounter (HOSPITAL_COMMUNITY): Payer: Self-pay

## 2013-07-03 DIAGNOSIS — N3289 Other specified disorders of bladder: Secondary | ICD-10-CM

## 2013-07-03 HISTORY — DX: Other specified disorders of bladder: N32.89

## 2013-07-03 LAB — BASIC METABOLIC PANEL
Chloride: 100 mEq/L (ref 96–112)
GFR calc Af Amer: 49 mL/min — ABNORMAL LOW (ref >90)
GFR calc non Af Amer: 42 mL/min — ABNORMAL LOW (ref >90)
Potassium: 6.3 mEq/L (ref 3.5–5.1)
Sodium: 136 mEq/L (ref 135–145)

## 2013-07-03 LAB — PROTIME-INR
INR: 1.86 — ABNORMAL HIGH (ref 0.00–1.49)
Prothrombin Time: 20.9 seconds — ABNORMAL HIGH (ref 11.6–15.2)

## 2013-07-03 NOTE — Progress Notes (Signed)
07-03-13 1550 Critical lab value Potassium 6.3- reported to Chasity Little of Dr. Vevelyn Royals office.

## 2013-07-03 NOTE — Patient Instructions (Addendum)
[address]  07/03/2013   Your procedure is scheduled on:7-14   -2014  Report to [facility] at     1200 noon.  Call this number if you have problems the morning of surgery: [phone]  Or Presurgical Testing [phone](Alika Eppes)   Remember: Follow any bowel prep instructions per MD office. For Cpap use: Bring mask and tubing only.   Do not eat food:After Midnight.  May have clear liquids:up to 6 Hours before arrival. Nothing after :  Clear liquids include soda, tea, black coffee, apple or grape juice, broth.  Take these medicines the morning of surgery with A SIP OF WATER: Carvedilol. Prednisone. Use Coumadin as directed by MD.   Do not wear jewelry, make-up or nail polish.  Do not wear lotions, powders, or perfumes. You may wear deodorant.  Do not shave 12 hours prior to first CHG shower(legs and under arms).(face and neck okay.)  Do not bring valuables to the hospital.  Contacts, dentures or bridgework,body piercing,  may not be worn into surgery.  Leave suitcase in the car. After surgery it may be brought to your room.  For patients admitted to the hospital, checkout time is 11:00 AM the day of discharge.   Patients discharged the day of surgery will not be allowed to drive home. Must have responsible person with you x 24 hours once discharged.  Name and phone number of your driver: [phone]-XBJY son(Daeveon)  Special Instructions: CHG(Chlorhedine 4%-"Hibiclens","Betasept","Aplicare") Shower Use Special Wash: see special instructions.(avoid face and genitals)      Failure to follow these instructions may result in Cancellation of your surgery.   Patient signature_______________________________________________________

## 2013-07-04 ENCOUNTER — Ambulatory Visit: Payer: Medicare Other

## 2013-07-05 NOTE — H&P (Signed)
Chief Complaint  Bladder mass   History of Present Illness  [name] is an 79 year old seen at the request of Dr. Jethro Bolus for an incidentally detected bladder mass.  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 He is scheduled to begin treatment on 06/27/13 including 3 cycles of systemic chemotherapy followed by radiation. On his staging PET CT, he was noted to have a 4.1 cm left nasal mass but also an incidentally detected 2.6 cm left posterolateral bladder wall mass consistent with probable urothelial carcinoma. He has had increasing lower urinary tract symptoms over the past few months.  Specifically, he describes increased urinary hesitancy, urgency, incomplete emptying, and frequency.  These symptoms acutely developed approximately one month ago.  He denies any gross hematuria.  He denies a personal history of bladder cancer.  He did smoke 1-1/2 packs of cigarettes for over 15 years and quit approximately 15 years ago.      ** His medical comorbidities include a history of CAD s/p MI, atrial fibrillation managed with warfarin, peripheral vascular disease, mitral regurgitation, congestive heart failure (EF about 25%), hypertension, and CKD with a baseline creatinine of 1.9. His cardiologist is Dr. [name].   Past Medical History Problems  1. History of  Atrial Fibrillation 427.31 2. History of  Chronic Kidney Disease (NKF Classification) 585.9 3. History of  Congestive Heart Failure 428.0 4. History of  Coronary Artery Disease V12.59 5. History of  Hyperlipidemia 272.4 6. History of  Hypertension 401.9 7. History of  Large Cell Lymphoma - Head, Face, & Neck 200.71 8. History of  Mitral Regurgitation 424.0 9. History of  Peripheral Vascular Disease 443.9 10. History of  Prior Myocardial Infarction 412  Surgical History Problems  1. History of  Coronary Artery Surgery Five Venous Bypass Grafts  Current Meds 1.  Carvedilol 25 MG Oral Tablet; Therapy: 17Jan2014 to 2. Centrum Silver TABS; Therapy: (Recorded:27Jun2014) to 3. Digoxin 125 MCG Oral Tablet; Therapy: 14Jan2014 to 4. Enalapril Maleate 10 MG Oral Tablet; Therapy: (Recorded:27Jun2014) to 5. Vytorin 10-40 MG Oral Tablet; Therapy: (Recorded:27Jun2014) to 6. Warfarin Sodium 5 MG Oral Tablet; Therapy: (Recorded:27Jun2014) to  Allergies Medication  1. No Known Drug Allergies  Family History Denied  1. Family history of  Bladder Cancer 2. Family history of  Kidney Cancer 3. Family history of  Prostate Cancer  Social History Problems    Former Smoker V15.82 quit 15 years ago   Marital History - Widowed Denied    History of  Alcohol Use  Review of Systems Genitourinary, constitutional, skin, eye, otolaryngeal, hematologic/lymphatic, cardiovascular, pulmonary, endocrine, musculoskeletal, gastrointestinal, neurological and psychiatric system(s) were reviewed and pertinent findings if present are noted.  Gastrointestinal: diarrhea and constipation.  Constitutional: feeling tired (fatigue).  Hematologic/Lymphatic: a tendency to easily bruise.  Respiratory: shortness of breath.    Vitals Vital Signs [Data Includes: Last 1 Day]  27Jun2014 11:16AM  Temperature: 94.7 F 27Jun2014 11:12AM  BMI Calculated: 23.03 BSA Calculated: 1.99 Height: 6 ft  Weight: 170 lb  Blood Pressure: 109 / 68 Heart Rate: 97  Physical Exam Constitutional: Well nourished and well developed . No acute distress.  ENT:. The ears and nose are normal in appearance.  Neck: The appearance of the neck is normal and no neck mass is present.  Pulmonary: No respiratory distress, normal respiratory rhythm and effort and clear bilateral breath sounds.  Cardiovascular: Heart rate and rhythm are normal . No peripheral edema.  Medial sternotomy scar(s).  Abdomen: The abdomen is soft and nontender. No masses are palpated. No CVA tenderness. No hernias are palpable. No  hepatosplenomegaly noted.  Rectal: Rectal exam demonstrates normal sphincter tone, no tenderness and no masses. Prostate size is estimated to be 45 g. The prostate has no nodularity and is not tender. The left seminal vesicle is nonpalpable. The right seminal vesicle is nonpalpable. The perineum is normal on inspection.  Genitourinary: Examination of the penis demonstrates no discharge, no masses, no lesions and a normal meatus. The penis is uncircumcised. The scrotum is without lesions. The right epididymis is palpably normal and non-tender. The left epididymis is palpably normal and non-tender. The right testis is non-tender and without masses. The left testis is non-tender and without masses. He does have some balanitis with some painless erythema located on the dorsal aspect of the penis.  Lymphatics: The supraclavicular, femoral and inguinal nodes are not enlarged or tender.  Skin: Normal skin turgor, no visible rash and no visible skin lesions.  Neuro/Psych:. Mood and affect are appropriate.    Results/Data Urine [Data Includes: Last 1 Day]   27Jun2014  COLOR YELLOW   APPEARANCE CLEAR   SPECIFIC GRAVITY 1.025   pH 5.5   GLUCOSE NEG mg/dL  BILIRUBIN NEG   KETONE NEG mg/dL  BLOOD NEG   PROTEIN NEG mg/dL  UROBILINOGEN 0.2 mg/dL  NITRITE NEG   LEUKOCYTE ESTERASE NEG        I independently reviewed his medical records  reviewed his PET CT scan.  This does demonstrate a very full bladder and he does have evidence of a hyperdense mass lesion on the left lateral bladder wall concerning for possible bladder tumor.  There is no pelvic lymphadenopathy or evidence of metastatic disease.   PVR: 145 cc   Procedure  Procedure: Cystoscopy  Chaperone Present: Heather S.  Indication: Bladder Mass.  Informed Consent: Risks, benefits, and potential adverse events were discussed and informed consent was obtained from the patient.  Prep: The patient was prepped with betadine.  Anesthesia:. Local  anesthesia was administered intraurethrally with 2% lidocaine jelly.  Antibiotic prophylaxis: Ciprofloxacin.  Procedure Note:  Urethral meatus:. No abnormalities.  Anterior urethra: No abnormalities.  Prostatic urethra:. The lateral prostatic lobes were enlarged.  Bladder: Visulization was clear. The ureteral orifices were in the normal anatomic position bilaterally and had clear efflux of urine. A systematic examination of the bladder was performed. On the left lateral wall of the bladder, there was noted to be a 3 cm papillary bladder tumor consistent with urothelial carcinoma. Posteriorly, there was an additional area of erythema and edema measuring approximately 3.5 cm. This was potentially consistent with high-grade disease. A saline bladder washing was obtained and sent for cytologic analysis. The patient tolerated the procedure well.  Complications: None.    Assessment Assessed  1. Neoplasm Of The Bladder 239.4  Plan Benign Prostatic Hyperplasia Localized With Urinary Obstruction With Other Lower Urinary Tract Symptoms (600.21)  1. Tamsulosin HCl 0.4 MG Oral Capsule; Take 1 capsule by mouth at bedtime; Therapy: 27Jun2014  to (Evaluate:22Jun2015)  Requested for: 27Jun2014; Last Rx:27Jun2014; Edited 2. PVR U/S  Requested for: 27Jun2014 Bladder Cancer (188.9)  3. Follow-up Office  Follow-up  Requested for: 27Jun2014 Health Maintenance (V70.0)  4. UA With REFLEX  Done: 27Jun2014 11:03AM Neoplasm Of The Bladder (239.4)  5. Cysto  Requested for: 27Jun2014  Discussion/Summary  1.  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  He understands that to confirm his diagnosis and for therapeutic purposes as well as for grading and staging, he needs to proceed with cystoscopy, exam under anesthesia, bilateral retrograde pyelography, and transurethral resection of his bladder tumors.  We have reviewed the potential risks,  complications, and alternative options associated with these procedures and the expected recovery process.  He is scheduled to begin systemic chemotherapy next week and I did speak with Dr. Gaylyn Rong today.  His chemotherapy will be delayed to allow him to proceed with his transurethral resection.  He does have a history of congestive heart failure with an ejection fraction of approximately 25%.  In addition, he is on Coumadin for atrial fibrillation will need to stop this prior to his TUR.  He understands that further treatment likely will be necessary and that those recommendations will be based on his clinical staging and grading of his malignancy.  2.  BPH/LUTS: His increased voiding symptoms may be related to his bladder tumor.  However, he has been provided a prescription for tamsulosin 0.4 mg nightly.  He has been instructed on proper use and potential side effects of this medication.  Cc:Dr. Jethro Bolus     Verified Results URINE CYTOLOGY1 27Jun2014 01:58PM1 Lyda Perone  SPECIMEN TYPE: OTHER   Test Name Result Flag Reference  FINAL DIAGNOSIS:1     - NO MALIGNANT CELLS IDENTIFIED. - BENIGN UROTHELIAL CELLS ARE PRESENT. RBC'S ARE PRESENT.  SOURCE:1 Bladder Washing1    60CC CUP1 CLEAR YELLOW BLW, 60CC CUP2 CLEAR YELLOW BLW RECEIVED 1 SLIDE PREPARED [id number] EM  239.4 NEOPLASM OF THE BLADDER  PATHOLOGIST:1     REVIEWED BY S. SERDAR DEMIRCI, MD, (ELECTRONIC SIGNATURE ON FILE)  1 Container Submitted  CYTOTECHNOLOGIST:1     ELIZABETH WATKINS; BA, CT(ASCP)     1. Amended By: Heloise Purpura; 06/24/2013 7:21 PMEST  Signatures Electronically signed by : Heloise Purpura, M.D.; Jun 24 2013  7:21PM

## 2013-07-08 ENCOUNTER — Encounter (HOSPITAL_COMMUNITY)
Admission: RE | Disposition: A | Discharge status: Home / Self Care | Payer: Self-pay | Source: Ambulatory Visit | Attending: Urology

## 2013-07-08 ENCOUNTER — Encounter (HOSPITAL_COMMUNITY): Payer: Self-pay

## 2013-07-08 ENCOUNTER — Ambulatory Visit (HOSPITAL_COMMUNITY): Payer: Medicare Other | Admitting: Certified Registered Nurse Anesthetist

## 2013-07-08 ENCOUNTER — Encounter (HOSPITAL_COMMUNITY): Payer: Self-pay | Admitting: Certified Registered Nurse Anesthetist

## 2013-07-08 ENCOUNTER — Observation Stay (HOSPITAL_COMMUNITY)
Admission: RE | Admit: 2013-07-08 | Discharge: 2013-07-09 | Disposition: A | Discharge status: Home / Self Care | Payer: Medicare Other | Source: Ambulatory Visit | Attending: Urology | Admitting: Urology

## 2013-07-08 DIAGNOSIS — N189 Chronic kidney disease, unspecified: Secondary | ICD-10-CM | POA: Insufficient documentation

## 2013-07-08 DIAGNOSIS — I4891 Unspecified atrial fibrillation: Secondary | ICD-10-CM | POA: Insufficient documentation

## 2013-07-08 DIAGNOSIS — I2510 Atherosclerotic heart disease of native coronary artery without angina pectoris: Secondary | ICD-10-CM | POA: Insufficient documentation

## 2013-07-08 DIAGNOSIS — I252 Old myocardial infarction: Secondary | ICD-10-CM | POA: Insufficient documentation

## 2013-07-08 DIAGNOSIS — E785 Hyperlipidemia, unspecified: Secondary | ICD-10-CM | POA: Insufficient documentation

## 2013-07-08 DIAGNOSIS — N401 Enlarged prostate with lower urinary tract symptoms: Secondary | ICD-10-CM | POA: Insufficient documentation

## 2013-07-08 DIAGNOSIS — Z7901 Long term (current) use of anticoagulants: Secondary | ICD-10-CM | POA: Insufficient documentation

## 2013-07-08 DIAGNOSIS — Z951 Presence of aortocoronary bypass graft: Secondary | ICD-10-CM | POA: Insufficient documentation

## 2013-07-08 DIAGNOSIS — C8589 Other specified types of non-Hodgkin lymphoma, extranodal and solid organ sites: Secondary | ICD-10-CM | POA: Insufficient documentation

## 2013-07-08 DIAGNOSIS — C672 Malignant neoplasm of lateral wall of bladder: Principal | ICD-10-CM | POA: Insufficient documentation

## 2013-07-08 DIAGNOSIS — Z79899 Other long term (current) drug therapy: Secondary | ICD-10-CM | POA: Insufficient documentation

## 2013-07-08 DIAGNOSIS — Z87891 Personal history of nicotine dependence: Secondary | ICD-10-CM | POA: Insufficient documentation

## 2013-07-08 DIAGNOSIS — Z01812 Encounter for preprocedural laboratory examination: Secondary | ICD-10-CM | POA: Insufficient documentation

## 2013-07-08 DIAGNOSIS — Z23 Encounter for immunization: Secondary | ICD-10-CM | POA: Insufficient documentation

## 2013-07-08 DIAGNOSIS — I129 Hypertensive chronic kidney disease with stage 1 through stage 4 chronic kidney disease, or unspecified chronic kidney disease: Secondary | ICD-10-CM | POA: Insufficient documentation

## 2013-07-08 DIAGNOSIS — I739 Peripheral vascular disease, unspecified: Secondary | ICD-10-CM | POA: Insufficient documentation

## 2013-07-08 HISTORY — PX: CYSTOSCOPY W/ RETROGRADES: SHX1426

## 2013-07-08 LAB — BASIC METABOLIC PANEL
CO2: 22 mEq/L (ref 19–32)
Calcium: 8.7 mg/dL (ref 8.4–10.5)
Creatinine, Ser: 1.37 mg/dL — ABNORMAL HIGH (ref 0.50–1.35)
GFR calc Af Amer: 54 mL/min — ABNORMAL LOW (ref >90)
GFR calc non Af Amer: 46 mL/min — ABNORMAL LOW (ref >90)
Sodium: 135 mEq/L (ref 135–145)

## 2013-07-08 LAB — PROTIME-INR: Prothrombin Time: 14.2 seconds (ref 11.6–15.2)

## 2013-07-08 SURGERY — CYSTOSCOPY, WITH RETROGRADE PYELOGRAM
Anesthesia: General | Wound class: Clean Contaminated

## 2013-07-08 MED ORDER — CIPROFLOXACIN IN D5W 400 MG/200ML IV SOLN
INTRAVENOUS | Status: AC
  Filled 2013-07-08: qty 200

## 2013-07-08 MED ORDER — MIDAZOLAM HCL 5 MG/5ML IJ SOLN
INTRAMUSCULAR | Status: DC | PRN
  Administered 2013-07-08 (×2): .5 mg via INTRAVENOUS

## 2013-07-08 MED ORDER — PNEUMOCOCCAL VAC POLYVALENT 25 MCG/0.5ML IJ INJ
0.5000 mL | INJECTION | INTRAMUSCULAR | Status: AC
  Administered 2013-07-09: 0.5 mL via INTRAMUSCULAR
  Filled 2013-07-08 (×2): qty 0.5

## 2013-07-08 MED ORDER — HYDROCODONE-ACETAMINOPHEN 5-325 MG PO TABS: 1.0000 | ORAL_TABLET | ORAL | Status: DC | PRN

## 2013-07-08 MED ORDER — HYDROMORPHONE HCL PF 1 MG/ML IJ SOLN: 0.2500 mg | INTRAMUSCULAR | Status: DC | PRN

## 2013-07-08 MED ORDER — SODIUM CHLORIDE 0.9 % IJ SOLN: 3.0000 mL | INTRAMUSCULAR | Status: DC | PRN

## 2013-07-08 MED ORDER — STERILE WATER FOR IRRIGATION IR SOLN
Status: DC | PRN
  Administered 2013-07-08: 500 mL

## 2013-07-08 MED ORDER — SODIUM CHLORIDE 0.9 % IJ SOLN
3.0000 mL | Freq: Two times a day (BID) | INTRAMUSCULAR | Status: DC
  Administered 2013-07-08: 3 mL via INTRAVENOUS

## 2013-07-08 MED ORDER — CARVEDILOL 25 MG PO TABS
25.0000 mg | ORAL_TABLET | Freq: Two times a day (BID) | ORAL | Status: DC
  Filled 2013-07-08 (×3): qty 1

## 2013-07-08 MED ORDER — 0.9 % SODIUM CHLORIDE (POUR BTL) OPTIME
TOPICAL | Status: DC | PRN
  Administered 2013-07-08: 1000 mL

## 2013-07-08 MED ORDER — OXYCODONE HCL 5 MG/5ML PO SOLN
5.0000 mg | Freq: Once | ORAL | Status: DC | PRN
  Filled 2013-07-08: qty 5

## 2013-07-08 MED ORDER — SUCCINYLCHOLINE CHLORIDE 20 MG/ML IJ SOLN
INTRAMUSCULAR | Status: DC | PRN
  Administered 2013-07-08: 100 mg via INTRAVENOUS

## 2013-07-08 MED ORDER — DOCUSATE SODIUM 100 MG PO CAPS: 100.0000 mg | ORAL_CAPSULE | Freq: Two times a day (BID) | ORAL | Status: DC

## 2013-07-08 MED ORDER — SODIUM CHLORIDE 0.9 % IJ SOLN
10.0000 mL | INTRAMUSCULAR | Status: DC | PRN
  Administered 2013-07-09: 10 mL

## 2013-07-08 MED ORDER — SODIUM CHLORIDE 0.9 % IV SOLN
INTRAVENOUS | Status: DC | PRN
  Administered 2013-07-08: 15:00:00 via INTRAVENOUS

## 2013-07-08 MED ORDER — ACETAMINOPHEN 10 MG/ML IV SOLN
1000.0000 mg | Freq: Once | INTRAVENOUS | Status: DC | PRN
  Filled 2013-07-08: qty 100

## 2013-07-08 MED ORDER — GLYCOPYRROLATE 0.2 MG/ML IJ SOLN
INTRAMUSCULAR | Status: DC | PRN
  Administered 2013-07-08: .8 mg via INTRAVENOUS

## 2013-07-08 MED ORDER — LIDOCAINE HCL (CARDIAC) 20 MG/ML IV SOLN
INTRAVENOUS | Status: DC | PRN
  Administered 2013-07-08: 75 mg via INTRAVENOUS

## 2013-07-08 MED ORDER — MEPERIDINE HCL 50 MG/ML IJ SOLN: 6.2500 mg | INTRAMUSCULAR | Status: DC | PRN

## 2013-07-08 MED ORDER — SODIUM CHLORIDE 0.9 % IV SOLN: 250.0000 mL | INTRAVENOUS | Status: DC | PRN

## 2013-07-08 MED ORDER — CIPROFLOXACIN IN D5W 400 MG/200ML IV SOLN
400.0000 mg | INTRAVENOUS | Status: AC
  Administered 2013-07-08: 400 mg via INTRAVENOUS

## 2013-07-08 MED ORDER — PROMETHAZINE HCL 25 MG/ML IJ SOLN: 6.2500 mg | INTRAMUSCULAR | Status: DC | PRN

## 2013-07-08 MED ORDER — EZETIMIBE-SIMVASTATIN 10-40 MG PO TABS
1.0000 | ORAL_TABLET | Freq: Every day | ORAL | Status: DC
  Administered 2013-07-08: 1 via ORAL
  Filled 2013-07-08 (×2): qty 1

## 2013-07-08 MED ORDER — OXYCODONE HCL 5 MG PO TABS: 5.0000 mg | ORAL_TABLET | Freq: Once | ORAL | Status: DC | PRN

## 2013-07-08 MED ORDER — ACETAMINOPHEN 325 MG PO TABS: 650.0000 mg | ORAL_TABLET | ORAL | Status: DC | PRN

## 2013-07-08 MED ORDER — CISATRACURIUM BESYLATE (PF) 10 MG/5ML IV SOLN
INTRAVENOUS | Status: DC | PRN
  Administered 2013-07-08 (×2): 2 mg via INTRAVENOUS
  Administered 2013-07-08: 6 mg via INTRAVENOUS

## 2013-07-08 MED ORDER — PREDNISONE 10 MG PO TABS
10.0000 mg | ORAL_TABLET | Freq: Every day | ORAL | Status: DC
  Filled 2013-07-08 (×3): qty 1

## 2013-07-08 MED ORDER — ONDANSETRON HCL 4 MG/2ML IJ SOLN
INTRAMUSCULAR | Status: DC | PRN
  Administered 2013-07-08 (×2): 2 mg via INTRAVENOUS

## 2013-07-08 MED ORDER — IOHEXOL 300 MG/ML  SOLN
INTRAMUSCULAR | Status: DC | PRN
  Administered 2013-07-08: 15 mL

## 2013-07-08 MED ORDER — PHENAZOPYRIDINE HCL 100 MG PO TABS: 100.0000 mg | ORAL_TABLET | Freq: Three times a day (TID) | ORAL | Status: DC | PRN

## 2013-07-08 MED ORDER — NEOSTIGMINE METHYLSULFATE 1 MG/ML IJ SOLN
INTRAMUSCULAR | Status: DC | PRN
  Administered 2013-07-08: 5 mg via INTRAVENOUS

## 2013-07-08 MED ORDER — PROPOFOL 10 MG/ML IV BOLUS
INTRAVENOUS | Status: DC | PRN
  Administered 2013-07-08: 150 mg via INTRAVENOUS

## 2013-07-08 MED ORDER — PHENYLEPHRINE HCL 10 MG/ML IJ SOLN
INTRAMUSCULAR | Status: DC | PRN
  Administered 2013-07-08: 160 ug via INTRAVENOUS
  Administered 2013-07-08: 40 ug via INTRAVENOUS
  Administered 2013-07-08 (×5): 80 ug via INTRAVENOUS
  Administered 2013-07-08: 160 ug via INTRAVENOUS

## 2013-07-08 MED ORDER — TAMSULOSIN HCL 0.4 MG PO CAPS
0.4000 mg | ORAL_CAPSULE | Freq: Every day | ORAL | Status: DC
  Administered 2013-07-08: 0.4 mg via ORAL
  Filled 2013-07-08 (×2): qty 1

## 2013-07-08 MED ORDER — SODIUM CHLORIDE 0.9 % IR SOLN
Status: DC | PRN
  Administered 2013-07-08: 12000 mL

## 2013-07-08 MED ORDER — DIGOXIN 125 MCG PO TABS
0.1250 mg | ORAL_TABLET | Freq: Every evening | ORAL | Status: DC
  Administered 2013-07-08: 0.125 mg via ORAL
  Filled 2013-07-08 (×2): qty 1

## 2013-07-08 MED ORDER — LACTATED RINGERS IV SOLN
INTRAVENOUS | Status: DC
  Administered 2013-07-08: 1000 mL via INTRAVENOUS
  Administered 2013-07-08: 15:00:00 via INTRAVENOUS

## 2013-07-08 MED ORDER — IOHEXOL 300 MG/ML  SOLN
INTRAMUSCULAR | Status: AC
  Filled 2013-07-08: qty 1

## 2013-07-08 MED ORDER — HYDROCODONE-ACETAMINOPHEN 5-325 MG PO TABS: 1.0000 | ORAL_TABLET | Freq: Four times a day (QID) | ORAL | Status: DC | PRN

## 2013-07-08 SURGICAL SUPPLY — 20 items
ADAPTER CATH URET PLST 4-6FR (CATHETERS) ×3 IMPLANT
BAG URINE DRAINAGE (UROLOGICAL SUPPLIES) ×3 IMPLANT
BAG URO CATCHER STRL LF (DRAPE) ×3 IMPLANT
CATH FOLEY 2WAY SLVR  5CC 18FR (CATHETERS) ×1
CATH FOLEY 2WAY SLVR 5CC 18FR (CATHETERS) ×2 IMPLANT
CATH INTERMIT  6FR 70CM (CATHETERS) IMPLANT
CATH URET 5FR 28IN CONE TIP (BALLOONS) ×1
CATH URET 5FR 70CM CONE TIP (BALLOONS) ×2 IMPLANT
CLOTH BEACON ORANGE TIMEOUT ST (SAFETY) ×3 IMPLANT
DRAPE CAMERA CLOSED 9X96 (DRAPES) ×3 IMPLANT
ELECT LOOP MED HF 24F 12D CBL (CLIP) ×3 IMPLANT
GLOVE BIOGEL M STRL SZ7.5 (GLOVE) ×3 IMPLANT
GOWN STRL NON-REIN LRG LVL3 (GOWN DISPOSABLE) ×3 IMPLANT
GUIDEWIRE STR DUAL SENSOR (WIRE) ×3 IMPLANT
HOLDER FOLEY CATH W/STRAP (MISCELLANEOUS) ×3 IMPLANT
MANIFOLD NEPTUNE II (INSTRUMENTS) ×3 IMPLANT
NS IRRIG 1000ML POUR BTL (IV SOLUTION) IMPLANT
PACK CYSTO (CUSTOM PROCEDURE TRAY) ×3 IMPLANT
SYRINGE IRR TOOMEY STRL 70CC (SYRINGE) ×3 IMPLANT
TUBING CONNECTING 10 (TUBING) ×3 IMPLANT

## 2013-07-08 NOTE — Progress Notes (Signed)
Patient states he had potassium rechecked at Dr Vevelyn Royals office Caleen Essex and they changed some of his diet.

## 2013-07-08 NOTE — Anesthesia Procedure Notes (Signed)
Procedure Name: Intubation Date/Time: 07/08/2013 2:37 PM Performed by: Edison Pace Pre-anesthesia Checklist: Patient identified, Timeout performed, Emergency Drugs available, Suction available and Patient being monitored Patient Re-evaluated:Patient Re-evaluated prior to inductionOxygen Delivery Method: Circle system utilized Preoxygenation: Pre-oxygenation with 100% oxygen Intubation Type: IV induction and Cricoid Pressure applied Ventilation: Mask ventilation without difficulty Laryngoscope Size: Miller and 3 Grade View: Grade II Tube type: Oral Tube size: 7.5 mm Number of attempts: 1 Airway Equipment and Method: Stylet Secured at: 21 cm Tube secured with: Tape Dental Injury: Teeth and Oropharynx as per pre-operative assessment

## 2013-07-08 NOTE — Anesthesia Preprocedure Evaluation (Addendum)
Anesthesia Evaluation  Patient identified by MRN, date of birth, ID band Patient awake    Reviewed: Allergy & Precautions, H&P , NPO status , Patient's Chart, lab work & pertinent test results  Airway Mallampati: III TM Distance: >3 FB Neck ROM: Limited    Dental  (+) Chipped, Poor Dentition, Dental Advisory Given and Edentulous Upper   Pulmonary neg pulmonary ROS,  breath sounds clear to auscultation  Pulmonary exam normal       Cardiovascular hypertension, Pt. on home beta blockers and Pt. on medications + CAD, + Past MI, + CABG, + Peripheral Vascular Disease and +CHF + dysrhythmias Atrial Fibrillation Rhythm:Irregular Rate:Normal  Echo 05/2013: ------------------------------------------------------------ Left ventricle:  This study is severely limited. Other sudies would be needed to be sure about LV function/EF. The septum moves. However there is suggestion of significant overall LV dysfunction. EF can not be estimated. The cavity size was normal. Wall thickness was normal. Images were inadequate for LV wall motion assessment. ------------------------------------------------------------ Aortic valve:  Sclerosis without stenosis.  Doppler:   No significant regurgitation. ------------------------------------------------------------ Aorta:  Aortic root: The aortic root was normal in size. ------------------------------------------------------------ Mitral valve:   The valve appears to be grossly normal. Doppler:   No significant regurgitation. ----------------------------------------------------------- Left atrium:  The atrium was mildly dilated. ------------------------------------------------------------ Right ventricle:  Not able to assess RV function due to poor visualization. Not able to assess RV size due to poor visualization. ------------------------------------------------------------ Pulmonic valve:   Poorly visualized.   Doppler:   No significant regurgitation. ------------------------------------------------------------ Tricuspid valve:  Poorly visualized. ------------------------------------------------------------ Right atrium:  Poorly visualized. ------------------------------------------------------------ Pericardium:  There was no pericardial effusion. ------------------------------------------------------------   Neuro/Psych negative neurological ROS  negative psych ROS   GI/Hepatic negative GI ROS, Neg liver ROS,   Endo/Other  negative endocrine ROS  Renal/GU Renal InsufficiencyRenal disease     Musculoskeletal negative musculoskeletal ROS (+)   Abdominal Normal abdominal exam  (+) + obese,   Bowel sounds: normal.  Peds  Hematology negative hematology ROS (+)   Anesthesia Other Findings   Reproductive/Obstetrics                      Anesthesia Physical  Anesthesia Plan  ASA: III  Anesthesia Plan: General   Post-op Pain Management:    Induction: Intravenous  Airway Management Planned: LMA  Additional Equipment:   Intra-op Plan:   Post-operative Plan: Extubation in OR  Informed Consent: I have reviewed the patients History and Physical, chart, labs and discussed the procedure including the risks, benefits and alternatives for the proposed anesthesia with the patient or authorized representative who has indicated his/her understanding and acceptance.   Dental advisory given  Plan Discussed with: Anesthesiologist, CRNA and Surgeon  Anesthesia Plan Comments:        Anesthesia Quick Evaluation

## 2013-07-08 NOTE — Preoperative (Signed)
Beta Blockers   Reason not to administer Beta Blockers:Not Applicable 

## 2013-07-08 NOTE — Transfer of Care (Signed)
Immediate Anesthesia Transfer of Care Note  Patient: Gabriel Estrada  Procedure(s) Performed: Procedure(s) with comments: CYSTOSCOPY WITH RETROGRADE PYELOGRAM (Bilateral) - GENERAL ANESTHESIA WITH PARALYSIS, EXAM UNDER ANESTHESIA     TRANSURETHRAL RESECTION OF BLADDER TUMOR WITH GYRUS (TURBT-GYRUS) (N/A)  Patient Location: PACU  Anesthesia Type:General  Level of Consciousness: awake, alert  and oriented  Airway & Oxygen Therapy: Patient Spontanous Breathing and Patient connected to face mask oxygen  Post-op Assessment: Report given to PACU RN, Post -op Vital signs reviewed and stable and Patient moving all extremities  Post vital signs: Reviewed and stable  Complications: No apparent anesthesia complications

## 2013-07-08 NOTE — Op Note (Signed)
Preoperative diagnosis: 1. Bladder tumor (3 cm)  Postoperative diagnosis:  1. Bladder tumor (3 cm)  Procedure:  1. Cystoscopy 2. Transurethral resection of bladder tumor (3 cm) 3. Bilateral retrograde pyelography with interpretation 4. Pelvic examination under anesthesia  Surgeon: Moody Bruins. M.D.  Anesthesia: General  Complications: None  Intraoperative findings:  1. Bladder tumor: A 3 cm papillary tumor was noted on the left lateral wall of the bladder. 2. Retrograde pyelography: Bilateral retrograde pyelography demonstrated normal caliber ureters bilaterally without filling defects.  There was no hydronephrosis or filling defects of the collecting systems bilaterally.  EBL: Minimal  Specimens: 1. Bladder tumor 2. Biopsies of deep base of bladder tumor  Disposition of specimens: Pathology  Indication: Gabriel Estrada is a patient who was found to have a bladder tumor. After reviewing the management options for treatment, he elected to proceed with the above surgical procedure(s). We have discussed the potential benefits and risks of the procedure, side effects of the proposed treatment, the likelihood of the patient achieving the goals of the procedure, and any potential problems that might occur during the procedure or recuperation. Informed consent has been obtained.  Description of procedure:  The patient was taken to the operating room and general anesthesia was induced.  The patient was placed in the dorsal lithotomy position, prepped and draped in the usual sterile fashion, and preoperative antibiotics were administered. A preoperative time-out was performed.   Cystourethroscopy was performed.  The patient's urethra was examined and demonstrated bilobar prostatic hypertrophy.   The bladder was then systematically examined in its entirety. There was a 3 cm papillary bladder tumor extending from just lateral to the left ureteral orifice and extending up the  lateral wall of the bladder. There were a few inflamed areas posteriorly but none that appeared worrisome for malignancy.  His cytology preoperatively was negative.  Attention then turned to the right ureteral orifice and a ureteral catheter was used to intubate the ureteral orifice.  Omnipaque contrast was injected through the ureteral catheter and a retrograde pyelogram was performed with findings as dictated above.  Attention then turned to the left ureteral orifice and a ureteral catheter was used to intubate the ureteral orifice.  Omnipaque contrast was injected through the ureteral catheter and a retrograde pyelogram was performed with findings as dictated above.  The bladder was then re-examined after the resectoscope was placed.  The bladder tumor was 3 cmcm.  It was located laterally on the left and appeared papillary. Using bipolar loop cautery resection, the entire tumor was resected and removed for permanent pathologic analysis.   Separate biopsies were also taken of the underlying deep bladder tissue and sent as a separate specimen.   Hemostasis was then achieved with the loop cautery and the bladder was emptied and reinspected with no further bleeding noted at the end of the procedure.    The bladder was then emptied and the procedure ended. A pelvic exam under anesthesia was performed.  No pelvic masses were noted.  His prostate measure about 50 cc without nodularity. An 18 Fr catheter was placed.  The patient appeared to tolerate the procedure well and without complications.  The patient was able to be awakened and transferred to the recovery unit in satisfactory condition.    Moody Bruins MD

## 2013-07-08 NOTE — Interval H&P Note (Signed)
History and Physical Interval Note:  07/08/2013 1:23 PM  Jaquawn Saffran  has presented today for surgery, with the diagnosis of Bladder Tumor  The various methods of treatment have been discussed with the patient and family. After consideration of risks, benefits and other options for treatment, the patient has consented to  Procedure(s) with comments: CYSTOSCOPY WITH RETROGRADE PYELOGRAM (Bilateral) - GENERAL ANESTHESIA WITH PARALYSIS, EXAM UNDER ANESTHESIA     TRANSURETHRAL RESECTION OF BLADDER TUMOR WITH GYRUS (TURBT-GYRUS) (N/A) as a surgical intervention .  The patient's history has been reviewed, patient examined, no change in status, stable for surgery.  I have reviewed the patient's chart and labs.  Questions were answered to the patient's satisfaction.     Edwen Mclester,LES

## 2013-07-08 NOTE — Anesthesia Postprocedure Evaluation (Signed)
Anesthesia Post Note  Patient: Gabriel Estrada  Procedure(s) Performed: Procedure(s) (LRB): CYSTOSCOPY WITH RETROGRADE PYELOGRAM (Bilateral) TRANSURETHRAL RESECTION OF BLADDER TUMOR WITH GYRUS (TURBT-GYRUS) (N/A)  Anesthesia type: General  Patient location: PACU  Post pain: Pain level controlled  Post assessment: Post-op Vital signs reviewed  Last Vitals: BP 123/70  Pulse 83  Temp(Src) 36.6 C (Oral)  Resp 18  Ht 6' (1.829 m)  Wt 175 lb (79.379 kg)  BMI 23.73 kg/m2  SpO2 95%  Post vital signs: Reviewed  Level of consciousness: sedated  Complications: No apparent anesthesia complications

## 2013-07-09 ENCOUNTER — Ambulatory Visit: Payer: Medicare Other | Admitting: Cardiology

## 2013-07-09 ENCOUNTER — Encounter (HOSPITAL_COMMUNITY): Payer: Self-pay | Admitting: Urology

## 2013-07-09 LAB — BASIC METABOLIC PANEL
Calcium: 8.8 mg/dL (ref 8.4–10.5)
GFR calc Af Amer: 51 mL/min — ABNORMAL LOW (ref >90)
GFR calc non Af Amer: 44 mL/min — ABNORMAL LOW (ref >90)
Potassium: 4.5 mEq/L (ref 3.5–5.1)
Sodium: 134 mEq/L — ABNORMAL LOW (ref 135–145)

## 2013-07-09 MED ORDER — HEPARIN SOD (PORK) LOCK FLUSH 100 UNIT/ML IV SOLN
500.0000 [IU] | INTRAVENOUS | Status: AC | PRN
  Administered 2013-07-09: 500 [IU]

## 2013-07-09 NOTE — Discharge Summary (Signed)
Date of admission: 07/08/2013  Date of discharge: 07/09/2013  Admission diagnosis: Bladder tumor   Discharge diagnosis: Bladder tumor  Secondary diagnoses: Atrial fibrillation, CKD  History and Physical: For full details, please see admission history and physical. Briefly, Duvan Mousel is a 79 y.o. year old patient with a 3 cm bladder tumor.   Hospital Course: After stopping his anticoagulation and ensuring normalization of his INR, Mr. Shillingburg was taken to the OR.  He underwent TURBT of a 3 cm left lateral bladder tumor. RPGs were normal bilaterally.  He was not administered postoperative mitomycin C due to the questionable grade of his tumor and the goal of reducing his risk for complications and need for systemic chemotherapy in the near future for his lymphoma.  He tolerated his procedure well and was transferred to a hospital room where he was monitored overnight on telemetry.  He remained stable and underwent a voiding trial on POD #1 and was discharged home.  Recent Labs  07/08/13 1835 07/09/13 0405  CREATININE 1.37* 1.44*    Disposition: Home  Discharge instruction: The patient was instructed to be ambulatory but told to refrain from heavy lifting, strenuous activity, or driving. He will hold his Coumadin for 5 days and then resume if his urine is clear at that time.  He has been instructed to have his INR checked at [facility] early next week.  Discharge medications:    Medication List         carvedilol 25 MG tablet  Commonly known as:  COREG  Take 25 mg by mouth 2 (two) times daily with a meal.     digoxin 0.125 MG tablet  Commonly known as:  LANOXIN  Take 0.125 mg by mouth every evening.     docusate sodium 100 MG capsule  Commonly known as:  COLACE  Take 1 capsule (100 mg total) by mouth 2 (two) times daily.     enalapril 10 MG tablet  Commonly known as:  VASOTEC  Take 5 mg by mouth every evening.     ezetimibe-simvastatin 10-40 MG per tablet  Commonly known  as:  VYTORIN  Take 1 tablet by mouth at bedtime.     HYDROcodone-acetaminophen 5-325 MG per tablet  Commonly known as:  NORCO/VICODIN  Take 1-2 tablets by mouth every 6 (six) hours as needed for pain.     lidocaine-prilocaine cream  Commonly known as:  EMLA  Apply topically as needed.     multivitamin with minerals Tabs  Take 1 tablet by mouth daily.     ondansetron 8 MG tablet  Commonly known as:  ZOFRAN  Take 1 tablet (8 mg total) by mouth every 8 (eight) hours as needed for nausea.     oxyCODONE-acetaminophen 5-325 MG per tablet  Commonly known as:  PERCOCET  Take one or two tablets by mouth every 6 hours as needed for pain.     phenazopyridine 100 MG tablet  Commonly known as:  PYRIDIUM  Take 1 tablet (100 mg total) by mouth 3 (three) times daily as needed for pain (for burning).     predniSONE 10 MG tablet  Commonly known as:  DELTASONE  Take 1 tablet (10 mg total) by mouth daily.     prochlorperazine 10 MG tablet  Commonly known as:  COMPAZINE  Take 1 tablet (10 mg total) by mouth every 6 (six) hours as needed.     tamsulosin 0.4 MG Caps  Commonly known as:  FLOMAX  Take 0.4 mg by mouth  1 day or 1 dose. bedtime     warfarin 5 MG tablet  Commonly known as:  COUMADIN  TAKE AS DIRECTED BY COUMADIN CLINIC        Followup:      Follow-up Information   Follow up with Hillery Zachman,LES, MD. (07/23/13 at 10:30)    Contact information:   [address] [place] [address] [phone]

## 2013-07-09 NOTE — Progress Notes (Signed)
Patient ID: Gabriel Estrada, male   DOB: 1928/09/20, 79 y.o.   MRN: [id number]  1 Day Post-Op Subjective: No complaints s/p TURBT yesterday.   Objective: Vital signs in last 24 hours: Temp:  [97.4 F (36.3 C)-97.8 F (36.6 C)] 97.6 F (36.4 C) (07/15 0427) Pulse Rate:  [83-100] 100 (07/15 0427) Resp:  [18-20] 18 (07/15 0427) BP: (116-136)/(70-82) 123/70 mmHg (07/15 0427) SpO2:  [95 %-100 %] 95 % (07/15 0427) Weight:  [79.379 kg (175 lb)] 79.379 kg (175 lb) (07/14 1719)  Intake/Output from previous day: 07/14 0701 - 07/15 0700 In: 2000 [P.O.:600; I.V.:1400] Out: 920 [Urine:920] Intake/Output this shift: Total I/O In: 240 [P.O.:240] Out: 920 [Urine:920]  Physical Exam:  General: Alert and oriented Abdomen: Soft, ND Urine: Clear  Lab Results: No results found for this basename: HGB, HCT,  in the last 72 hours BMET  Recent Labs  07/08/13 1835 07/09/13 0405  NA 135 134*  K 5.4* 4.5  CL 101 100  CO2 22 26  GLUCOSE 112* 92  BUN 40* 36*  CREATININE 1.37* 1.44*  CALCIUM 8.7 8.8     Studies/Results: No results found.  Assessment/Plan: - Voiding trial this morning - D/C home after voids   LOS: 1 day   Brittanyann Wittner,LES 07/09/2013, 6:56 AM

## 2013-07-09 NOTE — Care Management Note (Signed)
    Page 1 of 1   07/09/2013     2:59:25 PM   CARE MANAGEMENT NOTE 07/09/2013  Patient:  LANKFORD, GUTZMER   Account Number:  [id number]  Date Initiated:  07/09/2013  Documentation initiated by:  Lanier Clam  Subjective/Objective Assessment:   ADMITTED W/BLADDER TUMOR.     Action/Plan:   FROM HOME.   Anticipated DC Date:  07/09/2013   Anticipated DC Plan:  HOME/SELF CARE      DC Planning Services  CM consult      Choice offered to / List presented to:             Status of service:  Completed, signed off Medicare Important Message given?   (If response is "NO", the following Medicare IM given date fields will be blank) Date Medicare IM given:   Date Additional Medicare IM given:    Discharge Disposition:  HOME/SELF CARE  Per UR Regulation:  Reviewed for med. necessity/level of care/duration of stay  If discussed at Long Length of Stay Meetings, dates discussed:    Comments:  07/09/13 Reshunda Strider RN,BSN NCM [phone] S/P CYSTOURETHROSCOPY.NO HH,OR D/C NEEDS.

## 2013-07-15 ENCOUNTER — Telehealth: Payer: Self-pay | Admitting: Oncology

## 2013-07-15 NOTE — Telephone Encounter (Signed)
S/w pt re appt for 7/25 @ 1pm. appt scheduled per 7/2 pof - awaiting template.

## 2013-07-19 ENCOUNTER — Ambulatory Visit (HOSPITAL_BASED_OUTPATIENT_CLINIC_OR_DEPARTMENT_OTHER): Payer: Medicare Other | Admitting: Hematology and Oncology

## 2013-07-19 ENCOUNTER — Telehealth: Payer: Self-pay | Admitting: Hematology and Oncology

## 2013-07-19 VITALS — BP 110/64 | HR 71 | Temp 96.9°F | Resp 20 | Ht 72.0 in | Wt 176.3 lb

## 2013-07-19 DIAGNOSIS — C8330 Diffuse large B-cell lymphoma, unspecified site: Secondary | ICD-10-CM

## 2013-07-19 DIAGNOSIS — C8589 Other specified types of non-Hodgkin lymphoma, extranodal and solid organ sites: Secondary | ICD-10-CM

## 2013-07-19 NOTE — Telephone Encounter (Signed)
s.w. pt and advised on 7.31.14 appt...pt ok and awaer

## 2013-07-22 ENCOUNTER — Inpatient Hospital Stay (HOSPITAL_COMMUNITY)
Admission: EM | Admit: 2013-07-22 | Discharge: 2013-07-26 | DRG: 292 | Disposition: A | Discharge status: Home / Self Care | Payer: Medicare Other | Attending: Cardiology | Admitting: Cardiology

## 2013-07-22 ENCOUNTER — Encounter (HOSPITAL_COMMUNITY): Payer: Self-pay

## 2013-07-22 ENCOUNTER — Emergency Department (HOSPITAL_COMMUNITY): Payer: Medicare Other

## 2013-07-22 DIAGNOSIS — E785 Hyperlipidemia, unspecified: Secondary | ICD-10-CM | POA: Diagnosis present

## 2013-07-22 DIAGNOSIS — Z951 Presence of aortocoronary bypass graft: Secondary | ICD-10-CM

## 2013-07-22 DIAGNOSIS — N329 Bladder disorder, unspecified: Secondary | ICD-10-CM | POA: Diagnosis present

## 2013-07-22 DIAGNOSIS — I509 Heart failure, unspecified: Secondary | ICD-10-CM | POA: Diagnosis present

## 2013-07-22 DIAGNOSIS — I5023 Acute on chronic systolic (congestive) heart failure: Principal | ICD-10-CM

## 2013-07-22 DIAGNOSIS — E782 Mixed hyperlipidemia: Secondary | ICD-10-CM | POA: Diagnosis present

## 2013-07-22 DIAGNOSIS — C8589 Other specified types of non-Hodgkin lymphoma, extranodal and solid organ sites: Secondary | ICD-10-CM | POA: Diagnosis present

## 2013-07-22 DIAGNOSIS — N179 Acute kidney failure, unspecified: Secondary | ICD-10-CM

## 2013-07-22 DIAGNOSIS — IMO0002 Reserved for concepts with insufficient information to code with codable children: Secondary | ICD-10-CM

## 2013-07-22 DIAGNOSIS — I4891 Unspecified atrial fibrillation: Secondary | ICD-10-CM | POA: Diagnosis present

## 2013-07-22 DIAGNOSIS — I059 Rheumatic mitral valve disease, unspecified: Secondary | ICD-10-CM | POA: Diagnosis present

## 2013-07-22 DIAGNOSIS — I2510 Atherosclerotic heart disease of native coronary artery without angina pectoris: Secondary | ICD-10-CM | POA: Diagnosis present

## 2013-07-22 DIAGNOSIS — I252 Old myocardial infarction: Secondary | ICD-10-CM

## 2013-07-22 DIAGNOSIS — Z87891 Personal history of nicotine dependence: Secondary | ICD-10-CM

## 2013-07-22 DIAGNOSIS — R079 Chest pain, unspecified: Secondary | ICD-10-CM

## 2013-07-22 DIAGNOSIS — I129 Hypertensive chronic kidney disease with stage 1 through stage 4 chronic kidney disease, or unspecified chronic kidney disease: Secondary | ICD-10-CM | POA: Diagnosis present

## 2013-07-22 DIAGNOSIS — Z7901 Long term (current) use of anticoagulants: Secondary | ICD-10-CM

## 2013-07-22 DIAGNOSIS — I6529 Occlusion and stenosis of unspecified carotid artery: Secondary | ICD-10-CM | POA: Diagnosis present

## 2013-07-22 DIAGNOSIS — R799 Abnormal finding of blood chemistry, unspecified: Secondary | ICD-10-CM

## 2013-07-22 DIAGNOSIS — R7989 Other specified abnormal findings of blood chemistry: Secondary | ICD-10-CM

## 2013-07-22 DIAGNOSIS — I739 Peripheral vascular disease, unspecified: Secondary | ICD-10-CM | POA: Diagnosis present

## 2013-07-22 DIAGNOSIS — I4892 Unspecified atrial flutter: Secondary | ICD-10-CM | POA: Diagnosis present

## 2013-07-22 DIAGNOSIS — N189 Chronic kidney disease, unspecified: Secondary | ICD-10-CM

## 2013-07-22 DIAGNOSIS — N183 Chronic kidney disease, stage 3 unspecified: Secondary | ICD-10-CM | POA: Diagnosis present

## 2013-07-22 HISTORY — DX: Chronic kidney disease, stage 3 unspecified: N18.30

## 2013-07-22 HISTORY — DX: Chronic kidney disease, stage 3 (moderate): N18.3

## 2013-07-22 HISTORY — DX: Chronic systolic (congestive) heart failure: I50.22

## 2013-07-22 HISTORY — DX: Disorder of arteries and arterioles, unspecified: I77.9

## 2013-07-22 HISTORY — DX: Peripheral vascular disease, unspecified: I73.9

## 2013-07-22 LAB — CBC
MCV: 90.6 fL (ref 78.0–100.0)
Platelets: 142 10*3/uL — ABNORMAL LOW (ref 150–400)
RBC: 3.82 MIL/uL — ABNORMAL LOW (ref 4.22–5.81)
RDW: 14.5 % (ref 11.5–15.5)
WBC: 5.6 10*3/uL (ref 4.0–10.5)

## 2013-07-22 LAB — COMPREHENSIVE METABOLIC PANEL
ALT: 37 U/L (ref 0–53)
AST: 32 U/L (ref 0–37)
Albumin: 3.3 g/dL — ABNORMAL LOW (ref 3.5–5.2)
CO2: 27 mEq/L (ref 19–32)
Chloride: 102 mEq/L (ref 96–112)
Creatinine, Ser: 1.65 mg/dL — ABNORMAL HIGH (ref 0.50–1.35)
Potassium: 4.7 mEq/L (ref 3.5–5.1)
Sodium: 138 mEq/L (ref 135–145)
Total Bilirubin: 1.1 mg/dL (ref 0.3–1.2)

## 2013-07-22 LAB — MAGNESIUM: Magnesium: 2.1 mg/dL (ref 1.5–2.5)

## 2013-07-22 LAB — PRO B NATRIURETIC PEPTIDE: Pro B Natriuretic peptide (BNP): 11199 pg/mL — ABNORMAL HIGH (ref 0–450)

## 2013-07-22 LAB — DIGOXIN LEVEL: Digoxin Level: <0.3 ng/mL — ABNORMAL LOW (ref 0.8–2.0)

## 2013-07-22 LAB — PROTIME-INR: INR: 1.39 (ref 0.00–1.49)

## 2013-07-22 LAB — TROPONIN I: Troponin I: <0.3 ng/mL (ref <0.30)

## 2013-07-22 MED ORDER — WARFARIN SODIUM 7.5 MG PO TABS
7.5000 mg | ORAL_TABLET | Freq: Once | ORAL | Status: AC
  Administered 2013-07-22: 7.5 mg via ORAL
  Filled 2013-07-22: qty 1

## 2013-07-22 MED ORDER — ASPIRIN 81 MG PO CHEW
324.0000 mg | CHEWABLE_TABLET | Freq: Once | ORAL | Status: AC
  Administered 2013-07-22: 324 mg via ORAL
  Filled 2013-07-22: qty 4

## 2013-07-22 MED ORDER — DIGOXIN 125 MCG PO TABS
0.1250 mg | ORAL_TABLET | Freq: Every evening | ORAL | Status: DC
  Administered 2013-07-23 – 2013-07-25 (×3): 0.125 mg via ORAL
  Filled 2013-07-22 (×4): qty 1

## 2013-07-22 MED ORDER — FUROSEMIDE 10 MG/ML IJ SOLN
80.0000 mg | Freq: Two times a day (BID) | INTRAMUSCULAR | Status: DC
  Administered 2013-07-22 – 2013-07-24 (×4): 80 mg via INTRAVENOUS
  Filled 2013-07-22 (×6): qty 8

## 2013-07-22 MED ORDER — ACETAMINOPHEN 325 MG PO TABS: 650.0000 mg | ORAL_TABLET | ORAL | Status: DC | PRN

## 2013-07-22 MED ORDER — DOCUSATE SODIUM 100 MG PO CAPS
100.0000 mg | ORAL_CAPSULE | Freq: Two times a day (BID) | ORAL | Status: DC
  Administered 2013-07-23 – 2013-07-26 (×7): 100 mg via ORAL
  Filled 2013-07-22 (×10): qty 1

## 2013-07-22 MED ORDER — SODIUM CHLORIDE 0.9 % IJ SOLN
3.0000 mL | Freq: Two times a day (BID) | INTRAMUSCULAR | Status: DC
  Administered 2013-07-22 – 2013-07-26 (×7): 3 mL via INTRAVENOUS

## 2013-07-22 MED ORDER — CARVEDILOL 25 MG PO TABS
25.0000 mg | ORAL_TABLET | Freq: Two times a day (BID) | ORAL | Status: DC
  Administered 2013-07-23 – 2013-07-24 (×3): 25 mg via ORAL
  Filled 2013-07-22 (×5): qty 1

## 2013-07-22 MED ORDER — PHENAZOPYRIDINE HCL 100 MG PO TABS
100.0000 mg | ORAL_TABLET | Freq: Three times a day (TID) | ORAL | Status: DC | PRN
  Filled 2013-07-22: qty 1

## 2013-07-22 MED ORDER — ADULT MULTIVITAMIN W/MINERALS CH
1.0000 | ORAL_TABLET | Freq: Every day | ORAL | Status: DC
  Administered 2013-07-23 – 2013-07-26 (×4): 1 via ORAL
  Filled 2013-07-22 (×4): qty 1

## 2013-07-22 MED ORDER — ENALAPRIL MALEATE 5 MG PO TABS
5.0000 mg | ORAL_TABLET | Freq: Every evening | ORAL | Status: DC
  Administered 2013-07-22 – 2013-07-23 (×2): 5 mg via ORAL
  Filled 2013-07-22 (×3): qty 1

## 2013-07-22 MED ORDER — SODIUM CHLORIDE 0.9 % IV SOLN: 250.0000 mL | INTRAVENOUS | Status: DC | PRN

## 2013-07-22 MED ORDER — TAMSULOSIN HCL 0.4 MG PO CAPS
0.4000 mg | ORAL_CAPSULE | Freq: Every day | ORAL | Status: DC
  Administered 2013-07-22 – 2013-07-26 (×5): 0.4 mg via ORAL
  Filled 2013-07-22 (×5): qty 1

## 2013-07-22 MED ORDER — EZETIMIBE-SIMVASTATIN 10-40 MG PO TABS
1.0000 | ORAL_TABLET | Freq: Every day | ORAL | Status: DC
  Administered 2013-07-22 – 2013-07-25 (×4): 1 via ORAL
  Filled 2013-07-22 (×5): qty 1

## 2013-07-22 MED ORDER — NITROGLYCERIN 2 % TD OINT
0.5000 [in_us] | TOPICAL_OINTMENT | Freq: Once | TRANSDERMAL | Status: AC
  Administered 2013-07-22: 0.5 [in_us] via TOPICAL
  Filled 2013-07-22: qty 1

## 2013-07-22 MED ORDER — WARFARIN - PHARMACIST DOSING INPATIENT
Freq: Every day | Status: DC
  Administered 2013-07-23: 18:00:00

## 2013-07-22 MED ORDER — ONDANSETRON HCL 4 MG/2ML IJ SOLN: 4.0000 mg | Freq: Four times a day (QID) | INTRAMUSCULAR | Status: DC | PRN

## 2013-07-22 MED ORDER — SODIUM CHLORIDE 0.9 % IJ SOLN: 3.0000 mL | INTRAMUSCULAR | Status: DC | PRN

## 2013-07-22 MED ORDER — FUROSEMIDE 10 MG/ML IJ SOLN
40.0000 mg | Freq: Once | INTRAMUSCULAR | Status: AC
  Administered 2013-07-22: 40 mg via INTRAVENOUS
  Filled 2013-07-22: qty 4

## 2013-07-22 MED ORDER — HYDROCODONE-ACETAMINOPHEN 5-325 MG PO TABS: 1.0000 | ORAL_TABLET | Freq: Four times a day (QID) | ORAL | Status: DC | PRN

## 2013-07-22 NOTE — H&P (Signed)
Patient ID: Gabriel Estrada MRN: [id number], DOB/AGE: [birth year]   Admit date: 07/22/2013  Primary Physician: [name], MD Primary Cardiologist: Shela Commons. Hochrein, MD   Pt. Profile:  79 y/o male with h/o CAD and systolic CHF who presented to the ED today with a 3-4 day h/o progressive dyspnea and LEE.  Problem List  Past Medical History  Diagnosis Date  . Coronary artery disease     a. 02/2001 MI/Cath/CABG x 5: LIMA->LAD, VG->D1, VG->OM2, VG->PDA->LPL & MV Ring (#30 Seguin ring annuloplasty)  . PVD (peripheral vascular disease)   . Hyperlipidemia   . Mitral regurgitation     a. 02/2001 s/p #30 Seguin ring annuloplasty.  . Chronic systolic CHF (congestive heart failure)     a. 05/2013 Echo: appears to be signif overall LV dysfxn, cannot estimate EF.  Ao sclerosis.  . Hypertension   . Chronic kidney disease, unspecified   . Teeth missing     all teeth pulled-06-13-13  . A-fib     chronic-Dr. Hochrein,cardiology  . Lymphoma     Nasal Mass-left-Dr. Ha,oncology  . Bladder mass 07-03-13  . Carotid artery disease     a. 09/2012 U/S: 0-39% bilat ICA stenosis.    Past Surgical History  Procedure Laterality Date  . Coronary artery bypass graft  03/15/01    x 5  . Multiple extractions with alveoloplasty N/A 05/30/2013    Procedure: Extraction of tooth #'s 2,6,7,8,10,11,12,18,20,21,22,23,24,25,26,27,28,29 with alveoloplasty and mandibular tori reductions, and lateral exostoses reductions.;  Surgeon: Charlynne Pander, DDS;  Location: [facility] OR;  Service: Oral Surgery;  Laterality: N/A;  . Soft tissue biopsy       Diffuse B-Cell Lynphoma/ Nasal mucosa, biopsy, left,left nasal mass  . Portacath placement Right     right chest  . Cystoscopy w/ retrogrades Bilateral 07/08/2013    Procedure: CYSTOSCOPY WITH RETROGRADE PYELOGRAM;  Surgeon: Crecencio Mc, MD;  Location: [facility] ORS;  Service: Urology;  Laterality: Bilateral;  GENERAL ANESTHESIA WITH PARALYSIS, EXAM UNDER ANESTHESIA        Allergies  No  Known Allergies  HPI  79 y/o male with history of CAD and chronic systolic congestive heart failure. 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 Despite having come off of Coumadin for surgery in early July, he has otherwise been taking all his medicines as prescribed. He has not experienced any change in his diet or appetite. Over the past 3-4 days, he has noted worsening exertional dyspnea and also mild right greater than left low chimney edema. He weighs himself regularly and says that his weight really hasn't changed much. He denies PND, orthopnea, dizziness, syncope, or early satiety. He has had no change in abdominal girth. He has not had any chest pressure or pain although he has noted some upper respiratory tightness and wheezing associated with a productive cough that is occasionally streaked with blood. He lives with his sister who takes quite good care of him and when he asked that she bring his breakfast up to his room today, she was keenly aware that something was wrong. At that point, given his 4 days worth of dyspnea, they brought him in the ED. Here, ECG shows A. fib which is chronic.  He is slightly more pronounced ST depression V4 through V6.  Chest x-ray shows pulmonary edema and proBNP is elevated at greater than 11,000. Initial troponin is normal. Is currently asymptomatic  and resting in a stretcher. We've been asked to evaluate.  Home Medications  Prior to Admission medications   Medication Sig Start Date End Date Taking? Authorizing Provider  carvedilol (COREG) 25 MG tablet Take 25 mg by mouth 2 (two) times daily with a meal.   Yes Historical Provider, MD  digoxin (LANOXIN) 0.125 MG tablet Take 0.125 mg by mouth every evening.    Yes Historical Provider, MD  docusate sodium (COLACE) 100 MG capsule Take 1 capsule (100 mg total) by mouth 2 (two) times daily. 07/08/13  Yes Crecencio Mc, MD  enalapril (VASOTEC) 10 MG tablet Take 5 mg by mouth every evening.    Yes Historical Provider, MD  ezetimibe-simvastatin (VYTORIN) 10-40 MG per tablet Take 1 tablet by mouth at bedtime.   Yes Historical Provider, MD  HYDROcodone-acetaminophen (NORCO/VICODIN) 5-325 MG per tablet Take 1-2 tablets by mouth every 6 (six) hours as needed for pain. 07/08/13  Yes Crecencio Mc, MD  lidocaine-prilocaine (EMLA) cream Apply topically as needed. 05/29/13  Yes Exie Parody, MD  Multiple Vitamin (MULTIVITAMIN WITH MINERALS) TABS Take 1 tablet by mouth daily.   Yes Historical Provider, MD  ondansetron (ZOFRAN) 8 MG tablet Take 1 tablet (8 mg total) by mouth every 8 (eight) hours as needed for nausea. 05/29/13  Yes Exie Parody, MD  oxyCODONE-acetaminophen (PERCOCET) 5-325 MG per tablet Take one or two tablets by mouth every 6 hours as needed for pain. 05/30/13  Yes Charlynne Pander, DDS  phenazopyridine (PYRIDIUM) 100 MG tablet Take 1 tablet (100 mg total) by mouth 3 (three) times daily as needed for pain (for burning). 07/08/13  Yes Crecencio Mc, MD  prochlorperazine (COMPAZINE) 10 MG tablet Take 1 tablet (10 mg total) by mouth every 6 (six) hours as needed. 05/29/13  Yes Exie Parody, MD  tamsulosin (FLOMAX) 0.4 MG CAPS Take 0.4 mg by mouth 1 day or 1 dose. bedtime   Yes Historical Provider, MD  warfarin (COUMADIN) 5 MG tablet TAKE AS DIRECTED BY COUMADIN CLINIC 06/27/13  Yes [name], MD   Family History  Family History  Problem Relation Age of Onset  . Heart attack Mother   . Heart disease Sister   . Heart attack Brother   . Heart attack Brother   . Heart disease Sister   . Heart disease Sister   . Cancer Father     lung?  . Cancer Maternal Aunt     kidney cancer   Social History  History   Social History  . Marital Status: Widowed    Spouse Name: N/A    Number of Children: 2  . Years of Education: N/A   Occupational History  . retired     Production designer, theatre/television/film    Social History Main Topics    . Smoking status: Former Smoker -- 1.00 packs/day for 20 years    Quit date: 12/26/2010  . Smokeless tobacco: Never Used  . Alcohol Use: No     Comment: Used to drink beer, but not anymore.  . Drug Use: No  . Sexually Active: Not Currently   Other Topics Concern  . Not on file   Social History Narrative  . No narrative on file    Review of Systems General:  No chills, fever, night sweats or weight changes.  Cardiovascular:  No chest pain, +++ dyspnea on exertion, +++ R>L edema, orthopnea, palpitations, paroxysmal nocturnal dyspnea. Dermatological: No rash, lesions/masses Respiratory: +++ cough, +++dyspnea, +++ wheezing Urologic: No hematuria, dysuria  Abdominal:   No nausea, vomiting, diarrhea, bright red blood per rectum, melena, or hematemesis Neurologic:  No visual changes, wkns, changes in mental status. All other systems reviewed and are otherwise negative except as noted above.  Physical Exam  Blood pressure 134/84, pulse 99, temperature 97.9 F (36.6 C), temperature source Oral, resp. rate 21, height 6' (1.829 m), weight 174 lb (78.926 kg), SpO2 99.00%.  General: Pleasant, NAD Psych: Normal affect. Neuro: Alert and oriented X 3. Moves all extremities spontaneously. HEENT: Normal  Neck: Supple without bruits.  JVP to earlobe. Lungs:  Resp regular and unlabored, diminished bilat with exp wheezing in upper anterior airways. Heart: RRR no s3, s4.  2/6 syst murmur @ apex. Abdomen: Semi-firm, non-tender, non-distended, BS + x 4.  Extremities: No clubbing, cyanosis.  1+ edema R ankle, trace on left. DP/PT/Radials 2+ and equal bilaterally.  Labs  Troponin i, poc: 0.05  proBNP 11199  Lab Results  Component Value Date   WBC 5.6 07/22/2013   HGB 11.2* 07/22/2013   HCT 34.6* 07/22/2013   MCV 90.6 07/22/2013   PLT 142* 07/22/2013    Recent Labs Lab 07/22/13 1250  NA 138  K 4.7  CL 102  CO2 27  BUN 36*  CREATININE 1.65*  CALCIUM 9.2  PROT 6.3  BILITOT 1.1  ALKPHOS  69  ALT 37  AST 32  GLUCOSE 178*   Lab Results  Component Value Date   CHOL 124 10/11/2012   HDL 51.70 10/11/2012   LDLCALC 55 10/11/2012   TRIG 88.0 10/11/2012   Radiology/Studies  Dg Chest 2 View  07/22/2013   *RADIOLOGY REPORT*  Clinical Data: Chest pain and shortness of breath  CHEST - 2 VIEW  Comparison: 05/29/2013  Findings: Status post CABG.  Interval placement of right Port-A- Cath with tip to the cavoatrial junction.  There is moderate interstitial infiltrate in the right perihilar area.  There is also mild interstitial change in the left perihilar area.  There is mild vascular congestion.  IMPRESSION: Although right middle lobe pneumonia is not excluded, the findings suggest developing interstitial pulmonary edema.  Radiographic follow-up recommended.    Original Report Authenticated By: Esperanza Heir, M.D.   ECG  Afib, 103, right axis deviation, rbbb  ASSESSMENT AND PLAN  1.  Acute on chronic systolic CHF:  Pt presents with a 3-4 day h/o exertional dyspnea and mild LEE.  He has been weighing himself daily and has not noted any significant change.  That said, he does have evidence of significant volume overload on exam with elevated neck veins, LEE, edema on cxr, and elevated pBNP.  We will admit for IV diuresis.  Cont bb/acei/dig.  Would not add spiro @ this time 2/2 CKD and relative hyperkalemia.  Could consider addition of hydralazine and nitrate pending BP with diuresis.  2.  CAD:  No chest pain.  Cycle CE.  Cont bb/acei/statin.  No asa 2/2 concomitant coumadin usage.  3.  Afib: chronic.  Rate up somewhat in setting of acute illness.  Cont bb/coumadin.  Follow rate.  4.  CKD III:  Follow with diuresis.  5.  HTN: stable.  Signed, Nicolasa Ducking, NP 07/22/2013, 3:41 PM I have taken a history, reviewed medications, allergies, PMH, SH, FH, and reviewed ROS and examined the patient.  I agree with the assessment and plan. Needs diuresis. Continue other cardiac meds. No  a candidate for spironolactone. All questions answered. Discussed as well with his sisters.  Braeson Rupe C. Marlo Goodrich, MD, [facility]  [facility] Pager:  [phone]

## 2013-07-22 NOTE — ED Notes (Signed)
Pt c/o non radiating, mid-sternum chest pain, SOB, diaphoretic, dizziness and light headed upon standing, weakness, cough w/some blood tinge sputum, and RLE swelling x3 days. Pt reports his chest pain has subsided at this time. Pt denies using Oxygen at home. Pt took nitro sl x1 en route relieving his chest pain, denies taking ASA due to PCP instructed him to not take ASA while taking Coumadin

## 2013-07-22 NOTE — ED Provider Notes (Signed)
CSN: [id number]     Arrival date & time 07/22/13  1152 History     First MD Initiated Contact with Patient 07/22/13 1204     Chief Complaint  Patient presents with  . Chest Pain  . Shortness of Breath   (Consider location/radiation/quality/duration/timing/severity/associated sxs/prior Treatment) HPI Patient presents from home with concern of chest pain, diaphoresis, weakness. Symptoms have developed over the past 3 days. There has been no concurrent nausea, vomiting, diarrhea, fever, chills. No clear precipitant, no clear alleviating or exacerbating factors. The chest pain is anterior, pressure-like. There is mild associated lightheadedness, but no syncope. Patient does have mild productive cough. He states that he has been taking all medication as directed, with no changes in dosage or medication recently. He has notable history of cardiac bypass, hypertension.  Per EMS the patient was stable in route, and had entire relief of his chest pain with nitroglycerin tablet.  Past Medical History  Diagnosis Date  . Coronary artery disease 03/15/2001    x 1 with MI  . PVD (peripheral vascular disease)   . Hyperlipidemia   . Mitral regurgitation     moderate;  with postinfarction class 4 unstable angina  . Myocardial infarction   . CHF (congestive heart failure)     class 4 congestive heart failure  . Hypertension   . Chronic kidney disease, unspecified   . Teeth missing     all teeth pulled-06-13-13  . A-fib     chronic-Dr. Hochrein,cardiology  . Lymphoma     Nasal Mass-left-Dr. Ha,oncology  . Bladder mass 07-03-13   Past Surgical History  Procedure Laterality Date  . Coronary artery bypass graft  03/15/01    x 5  . Multiple extractions with alveoloplasty N/A 05/30/2013    Procedure: Extraction of tooth #'s 2,6,7,8,10,11,12,18,20,21,22,23,24,25,26,27,28,29 with alveoloplasty and mandibular tori reductions, and lateral exostoses reductions.;  Surgeon: Charlynne Pander, DDS;   Location: [facility] OR;  Service: Oral Surgery;  Laterality: N/A;  . Soft tissue biopsy       Diffuse B-Cell Lynphoma/ Nasal mucosa, biopsy, left,left nasal mass  . Portacath placement Right     right chest  . Cystoscopy w/ retrogrades Bilateral 07/08/2013    Procedure: CYSTOSCOPY WITH RETROGRADE PYELOGRAM;  Surgeon: Crecencio Mc, MD;  Location: [facility] ORS;  Service: Urology;  Laterality: Bilateral;  GENERAL ANESTHESIA WITH PARALYSIS, EXAM UNDER ANESTHESIA       Family History  Problem Relation Age of Onset  . Heart attack Mother   . Heart disease Sister   . Heart attack Brother   . Heart attack Brother   . Heart disease Sister   . Heart disease Sister   . Cancer Father     lung?  . Cancer Maternal Aunt     kidney cancer   History  Substance Use Topics  . Smoking status: Former Smoker -- 1.00 packs/day for 20 years    Quit date: 12/26/2010  . Smokeless tobacco: Never Used  . Alcohol Use: No     Comment: Used to drink beer, but not anymore.    Review of Systems  Constitutional:       Per HPI, otherwise negative  HENT:       Per HPI, otherwise negative  Respiratory:       Per HPI, otherwise negative  Cardiovascular:       Per HPI, otherwise negative  Gastrointestinal: Negative for vomiting.  Endocrine:       Negative aside from HPI  Genitourinary:  Neg aside from HPI   Musculoskeletal:       Per HPI, otherwise negative  Skin: Negative.   Neurological: Negative for syncope.    Allergies  Review of patient's allergies indicates no known allergies.  Home Medications   Current Outpatient Rx  Name  Route  Sig  Dispense  Refill  . carvedilol (COREG) 25 MG tablet   Oral   Take 25 mg by mouth 2 (two) times daily with a meal.         . digoxin (LANOXIN) 0.125 MG tablet   Oral   Take 0.125 mg by mouth every evening.          . docusate sodium (COLACE) 100 MG capsule   Oral   Take 1 capsule (100 mg total) by mouth 2 (two) times daily.   30 capsule   0   .  enalapril (VASOTEC) 10 MG tablet   Oral   Take 5 mg by mouth every evening.          . ezetimibe-simvastatin (VYTORIN) 10-40 MG per tablet   Oral   Take 1 tablet by mouth at bedtime.         [name] HYDROcodone-acetaminophen (NORCO/VICODIN) 5-325 MG per tablet   Oral   Take 1-2 tablets by mouth every 6 (six) hours as needed for pain.   25 tablet   0   . lidocaine-prilocaine (EMLA) cream   Topical   Apply topically as needed.   30 g   2   . Multiple Vitamin (MULTIVITAMIN WITH MINERALS) TABS   Oral   Take 1 tablet by mouth daily.         . ondansetron (ZOFRAN) 8 MG tablet   Oral   Take 1 tablet (8 mg total) by mouth every 8 (eight) hours as needed for nausea.   20 tablet   0   . oxyCODONE-acetaminophen (PERCOCET) 5-325 MG per tablet      Take one or two tablets by mouth every 6 hours as needed for pain.   30 tablet   0   . phenazopyridine (PYRIDIUM) 100 MG tablet   Oral   Take 1 tablet (100 mg total) by mouth 3 (three) times daily as needed for pain (for burning).   20 tablet   0   . prochlorperazine (COMPAZINE) 10 MG tablet   Oral   Take 1 tablet (10 mg total) by mouth every 6 (six) hours as needed.   30 tablet   3   . tamsulosin (FLOMAX) 0.4 MG CAPS   Oral   Take 0.4 mg by mouth 1 day or 1 dose. bedtime         . warfarin (COUMADIN) 5 MG tablet      TAKE AS DIRECTED BY COUMADIN CLINIC   40 tablet   3    BP 106/56  Pulse 101  Temp(Src) 97.9 F (36.6 C) (Oral)  Resp 23  Ht 6' (1.829 m)  Wt 174 lb (78.926 kg)  BMI 23.59 kg/m2  SpO2 97% Physical Exam  Nursing note and vitals reviewed. Constitutional: He is oriented to person, place, and time. He appears well-developed. No distress.  HENT:  Head: Normocephalic and atraumatic.  Eyes: Conjunctivae and EOM are normal.  Cardiovascular: Normal rate and regular rhythm.   Pulmonary/Chest: Effort normal. No stridor. No respiratory distress.  Sternal scar well healing in appearance  Abdominal: He  exhibits no distension.  Musculoskeletal: He exhibits no edema.  Neurological: He is alert and oriented to  person, place, and time.  Skin: Skin is warm and dry.  Psychiatric: He has a normal mood and affect.    ED Course   Procedures (including critical care time)  Labs Reviewed  PRO B NATRIURETIC PEPTIDE  CBC  COMPREHENSIVE METABOLIC PANEL  PROTIME-INR   No results found. No diagnosis found. Pulse ox 99% with nasal cannula abnormal Cardiac 95 sinus rhythm normal EKG has sinus versus junctional regular rhythm with tachycardia. There is intraventricular conduction delay with nonspecific ST changes. The rhythm is more irregular than prior, but otherwise the EKG is largely the same. This is an abnormal EKG.  Reviewed the patient's chart.   Update: On repeat exam the patient has a similar vital signs.  Evidence of heart failure exacerbation is present.  I discussed all results with him.  He confirms that his recent bladder biopsy did not result in any emesis of malignancy, and he is not currently getting any chemotherapy for lymphoma. He continues to feel better. MDM  Patient presents with chest pain, weakness, cough and fatigue.  Patient's pain improved with nitroglycerin, and his exam here is notable for elevated BNP.  Patient is also hypoxic on room air, which is new for him.  The patient has lymphoma, he is not undergoing any therapy.  Patient is afebrile, awake and alert, mentating appropriately.  Patient required admission for further evaluation and management of his elevated BNP, chest pain with diaphoresis and dyspnea with cough.  Gerhard Munch, MD 07/22/13 1434

## 2013-07-22 NOTE — Progress Notes (Addendum)
ANTICOAGULATION CONSULT NOTE - Initial Consult  Pharmacy Consult for coumadin Indication: atrial fibrillation  No Known Allergies  Patient Measurements: Height: 6' (182.9 cm) Weight: 174 lb (78.926 kg) IBW/kg (Calculated) : 77.6  Vital Signs: Temp: 97.9 F (36.6 C) (07/28 1215) Temp src: Oral (07/28 1215) BP: 120/61 mmHg (07/28 1745) Pulse Rate: 102 (07/28 1745)  Labs:  Recent Labs  07/22/13 1250  HGB 11.2*  HCT 34.6*  PLT 142*  LABPROT 16.7*  INR 1.39  CREATININE 1.65*    Estimated Creatinine Clearance: 37.9 ml/min (by C-G formula based on Cr of 1.65).   Medical History: Past Medical History  Diagnosis Date  . Coronary artery disease     a. 02/2001 MI/Cath/CABG x 5: LIMA->LAD, VG->D1, VG->OM2, VG->PDA->LPL & MV Ring (#30 Seguin ring annuloplasty)  . PVD (peripheral vascular disease)   . Hyperlipidemia   . Mitral regurgitation     a. 02/2001 s/p #30 Seguin ring annuloplasty.  . Chronic systolic CHF (congestive heart failure)     a. 05/2013 Echo: appears to be signif overall LV dysfxn, cannot estimate EF.  Ao sclerosis.  . Hypertension   . Chronic kidney disease, unspecified   . Teeth missing     all teeth pulled-06-13-13  . A-fib     chronic-Dr. Hochrein,cardiology  . Lymphoma     Nasal Mass-left-Dr. Ha,oncology  . Bladder mass 07-03-13  . Carotid artery disease     a. 09/2012 U/S: 0-39% bilat ICA stenosis.    Medications:  See med rec  Assessment: Patient is an 79 y.o M with hx of HF and on coumadin PTA for chronic Afib. Patient has bladder tumor, s/p cystoscopy and biopsy on 07/08/13.  He reported that he was off coumadin for about 5 days prior to the biopsy procedure and was restarted back this past Wednesday (07/17/13).  His current regimen is 5mg  daily except 2.5mg  on Tuesdays and Thursdays with last dose taken on 7/27. INR is subtherapeutic at 1.39. He stated that he has not missed any doses since Wednesday.  He presented to the ED today with c/o SOB and  lower extremity edema.    Goal of Therapy:  INR 2-3  Plan:  1) Coumadin 7.5mg  PO x1 today (1.5x home dose)  Arlis Everly P 07/22/2013,5:54 PM

## 2013-07-22 NOTE — Progress Notes (Signed)
The patient arrived to [id number].  The patient was oriented to the unit and placed on telemetry.   Vital signs were taken and the patient was weighed.  The patient does not have any complaints of pain at this time.

## 2013-07-22 NOTE — Progress Notes (Signed)
ID: [name] OB: [birth year]  MR#: [id number]  CSN#:[id number]  PCP: [name], MD  OFFICE PROGRESS NOTE   DIAGNOSIS:  1. 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    PAST THERAPY:  Biopsy only.  DIAGNOSIS:   2. Low grade papillary urothelial carcinoma   PAST THERAPY:  Cystoscopy Transurethral resection of bladder tumor (3 cm) Bilateral retrograde pyelography with interpretation Pelvic examination under anesthesia  CURRENT THERAPY: due to start therapy soon.   INTERVAL HISTORY: Gabriel Estrada 79 years old male returns for regular follow up with his sister.For his bladder cancer cystoscopy, transurethral resection of bladder tumor (3 cm), bilateral retrograde pyelography with interpretation and pelvic examination under anesthesia was done. Pathology reported microscopic foci of low grade papillary urothelial carcinoma with associated desmoplastic stromal reaction and chronic inflammation; the morphologic features are mostly consistent with focal superficially invasive low grade papillary urothelial carcinoma. There is no evidence of angiolymphatic invasion identified. Muscularis propria is present for evaluation and it is negative for tumor.   He still has left nasal mass. Now he can  breath from the left side of his nose after treatment with prednisone.  He denied pain, bleeding from this mass.  He denied lymph node swelling anywhere else.  He still has LUTS with slow urine stream.  He wakes up about six times a night to urinate .He denied hematuria.  He has bilateral lower extremity swelling but denied chest pain. He has dyspnea on minimal exertion.  His strength is stable.  He is still independent of daily activities.  REVIEW OF SYSTEMS: Review of Systems  Constitutional: Negative for fever, chills, weight loss, malaise/fatigue and diaphoresis.  HENT: Positive for congestion. Negative for hearing loss, ear pain, nosebleeds, sore throat, neck pain and tinnitus.   Eyes:  Negative for blurred vision and double vision.  Respiratory: Positive for shortness of breath. Negative for cough, hemoptysis, sputum production and wheezing.   Cardiovascular: Positive for leg swelling. Negative for chest pain, palpitations and orthopnea.  Gastrointestinal: Negative for heartburn, nausea, vomiting, abdominal pain, diarrhea, constipation and blood in stool.  Genitourinary: Positive for frequency. Negative for dysuria, urgency and hematuria.  Musculoskeletal: Negative for myalgias, back pain and joint pain.  Skin: Negative for itching and rash.  Neurological: Negative for dizziness, tingling, tremors, sensory change, seizures, loss of consciousness, weakness and headaches.  Endo/Heme/Allergies: Negative for polydipsia.  Psychiatric/Behavioral: Negative for depression, hallucinations and substance abuse. The patient is not nervous/anxious.     PAST MEDICAL HISTORY: Past Medical History  Diagnosis Date  . Coronary artery disease 03/15/2001    x 1 with MI  . PVD (peripheral vascular disease)   . Hyperlipidemia   . Mitral regurgitation     moderate;  with postinfarction class 4 unstable angina  . Myocardial infarction   . CHF (congestive heart failure)     class 4 congestive heart failure  . Hypertension   . Chronic kidney disease, unspecified   . Teeth missing     all teeth pulled-06-13-13  . A-fib     chronic-Dr. Hochrein,cardiology  . Lymphoma     Nasal Mass-left-Dr. Ha,oncology  . Bladder mass 07-03-13    PAST SURGICAL HISTORY: Past Surgical History  Procedure Laterality Date  . Coronary artery bypass graft  03/15/01    x 5  . Multiple extractions with alveoloplasty N/A 05/30/2013    Procedure: Extraction of tooth #'s 2,6,7,8,10,11,12,18,20,21,22,23,24,25,26,27,28,29 with alveoloplasty and mandibular tori reductions, and lateral exostoses reductions.;  Surgeon: Charlynne Pander,  DDS;  Location: [facility] OR;  Service: Oral Surgery;  Laterality: N/A;  . Soft tissue biopsy        Diffuse B-Cell Lynphoma/ Nasal mucosa, biopsy, left,left nasal mass  . Portacath placement Right     right chest  . Cystoscopy w/ retrogrades Bilateral 07/08/2013    Procedure: CYSTOSCOPY WITH RETROGRADE PYELOGRAM;  Surgeon: Crecencio Mc, MD;  Location: [facility];  Service: Urology;  Laterality: Bilateral;  GENERAL ANESTHESIA WITH PARALYSIS, EXAM UNDER ANESTHESIA        FAMILY HISTORY Family History  Problem Relation Age of Onset  . Heart attack Mother   . Heart disease Sister   . Heart attack Brother   . Heart attack Brother   . Heart disease Sister   . Heart disease Sister   . Cancer Father     lung?  . Cancer Maternal Aunt     kidney cancer    HEALTH MAINTENANCE: History  Substance Use Topics  . Smoking status: Former Smoker -- 1.00 packs/day for 20 years    Quit date: 12/26/2010  . Smokeless tobacco: Never Used  . Alcohol Use: No     Comment: Used to drink beer, but not anymore.    No Known Allergies  Current Outpatient Prescriptions  Medication Sig Dispense Refill  . carvedilol (COREG) 25 MG tablet Take 25 mg by mouth 2 (two) times daily with a meal.      . digoxin (LANOXIN) 0.125 MG tablet Take 0.125 mg by mouth every evening.       . docusate sodium (COLACE) 100 MG capsule Take 1 capsule (100 mg total) by mouth 2 (two) times daily.  30 capsule  0  . enalapril (VASOTEC) 10 MG tablet Take 5 mg by mouth every evening.       . ezetimibe-simvastatin (VYTORIN) 10-40 MG per tablet Take 1 tablet by mouth at bedtime.      . lidocaine-prilocaine (EMLA) cream Apply topically as needed.  30 g  2  . Multiple Vitamin (MULTIVITAMIN WITH MINERALS) TABS Take 1 tablet by mouth daily.      . ondansetron (ZOFRAN) 8 MG tablet Take 1 tablet (8 mg total) by mouth every 8 (eight) hours as needed for nausea.  20 tablet  0  . tamsulosin (FLOMAX) 0.4 MG CAPS Take 0.4 mg by mouth 1 day or 1 dose. bedtime      . warfarin (COUMADIN) 5 MG tablet TAKE AS DIRECTED BY COUMADIN CLINIC  40 tablet   3  . HYDROcodone-acetaminophen (NORCO/VICODIN) 5-325 MG per tablet Take 1-2 tablets by mouth every 6 (six) hours as needed for pain.  25 tablet  0  . oxyCODONE-acetaminophen (PERCOCET) 5-325 MG per tablet Take one or two tablets by mouth every 6 hours as needed for pain.  30 tablet  0  . phenazopyridine (PYRIDIUM) 100 MG tablet Take 1 tablet (100 mg total) by mouth 3 (three) times daily as needed for pain (for burning).  20 tablet  0  . prochlorperazine (COMPAZINE) 10 MG tablet Take 1 tablet (10 mg total) by mouth every 6 (six) hours as needed.  30 tablet  3   No current facility-administered medications for this visit.    OBJECTIVE: Filed Vitals:   07/19/13 1255  BP: 110/64  Pulse: 71  Temp: 96.9 F (36.1 C)  Resp: 20     Body mass index is 23.91 kg/(m^2).    ECOG Performance status: 1-2  PHYSICAL EXAMINATION:   General:  Thin-appearing man, in no acute distress.  Eyes:  no scleral icterus.  ENT:  There was an obstructing mass in the left nostril with swelling of the left nose bridge.  Neck was without thyromegaly.  Lymphatics:  Negative cervical, supraclavicular or axillary adenopathy.  Respiratory: lungs were clear bilaterally without wheezing or crackles.  Cardiovascular:  Irregularly irregular rate and rhythm, S1/S2, without murmur, rub or gallop.  There was +3 edema.  GI:  abdomen was soft, flat, nontender, nondistended, without organomegaly.  Muscoloskeletal:  no spinal tenderness of palpation of vertebral spine.  Skin exam was without ecchymosis, petechiae.  Neuro exam was nonfocal.  Patient was able to get on and off exam table without assistance.  Gait was normal.  Patient was alert and oriented.  Attention was good.   Language was appropriate.  Mood was normal without depression.  Speech was not pressured.  Thought content was not tangential.    LAB RESULTS:  CMP     Component Value Date/Time   NA 134* 07/09/2013 0405   NA 139 05/29/2013 1328   K 4.5 07/09/2013 0405   K 5.1  05/29/2013 1328   CL 100 07/09/2013 0405   CL 104 05/29/2013 1328   CO2 26 07/09/2013 0405   CO2 25 05/29/2013 1328   GLUCOSE 92 07/09/2013 0405   GLUCOSE 98 05/29/2013 1328   BUN 36* 07/09/2013 0405   BUN 44.2* 05/29/2013 1328   CREATININE 1.44* 07/09/2013 0405   CREATININE 1.9* 05/29/2013 1328   CALCIUM 8.8 07/09/2013 0405   CALCIUM 10.0 05/29/2013 1328   PROT 7.3 05/29/2013 1328   PROT 6.7 12/06/2010 0807   ALBUMIN 4.0 05/29/2013 1328   ALBUMIN 4.1 12/06/2010 0807   AST 22 05/29/2013 1328   AST 22 12/06/2010 0807   ALT 18 05/29/2013 1328   ALT 16 12/06/2010 0807   ALKPHOS 75 05/29/2013 1328   ALKPHOS 55 12/06/2010 0807   BILITOT 1.04 05/29/2013 1328   BILITOT 0.8 12/06/2010 0807   GFRNONAA 44* 07/09/2013 0405   GFRAA 51* 07/09/2013 0405   Lab Results  Component Value Date   WBC 5.5 06/26/2013   NEUTROABS 3.7 06/26/2013   HGB 11.7* 06/26/2013   HCT 35.7* 06/26/2013   MCV 89.3 06/26/2013   PLT 133* 06/26/2013    Assessment and plan:  1.  Bladder cancer: Cystoscopy, transurethral resection of bladder tumor (3 cm), bilateral retrograde pyelography with interpretation and pelvic examination under anesthesia was done on 07/08/13 by .Dr. Crecencio Mc. Pathology reported: LOW GRADE PAPILLARY UROTHELIAL CARCINOMA, FOCALLY SUPERFICIALLY INVADING INTO THE UNDERLYING LAMINA PROPRIA without evidence of angiolymphatic invasion identified. Muscularis propria is present for evaluation and it is negative for tumor. Current recommendations of NCCN guidelines to proceed with intravesical therapy: BCG (preferred)I recommend to discuss this with urologist. The optimal time to administer chemotherapy is immediately after TUR as the drugs might prevent reseeding of cancer cells disrupted with surgery. I fit will be decision to proceed with BCG it can be done next week.  2.  Diagnosis:  Stage I Diffuse Large B-cell lymphoma; locally progressing.  - Concurrent diagnosis of nasal lymphoma and bladder cancer. We will discuss treatment  options for stage I DLBCL next week after patient will be seen by urologist. Patient has class 4 CHF and CAD and we need to discussed our treatment plan with side effects in great details. As Dr.Ha mentioned. - Issues with chemo:  R-CHOP:  Rituxan/ Cytoxan/ Adriamycin/ Vincristine/ Prednisone.   *  His ejection fraction is only in the 27% indicating severe  congestive heart failure.  *  If chemo is considered, we should not use Adriamycin since it can cause worsening heart failure.  *  Option then would be R-CVP (without Adriamycin) or substituting Adriamycin for Etoposide.     *  Return to clinic next week after BCG to consider starting chemo.   3.  Follow up: next week after urologist visit.    Myra Rude, MD   07/22/2013 8:18 AM

## 2013-07-22 NOTE — ED Notes (Signed)
Lockwood MD at bedside. 

## 2013-07-22 NOTE — ED Notes (Signed)
Per [facility] pt from home c/o non radiating mid-sternum chest pain, diaphoretic, weakness, light headed with standing, cough w/minimal sputum, intermittent blood tinge sputum, RLE swelling +2 pitting edema, and SOB x3 days. Pt denies N/V/D, or abd/back/arm/shoulder/jaw pain. Pt a/o x4, accessory muscle use for breathing. 20 G LH, VSS BP 118/65, HR 102, 94% ra, RR 20 pt received nitro sl x1 en route decreasing his pain, did not take ASA, states his PCP instructed him to not take ASA due to Coumadin

## 2013-07-23 DIAGNOSIS — I5023 Acute on chronic systolic (congestive) heart failure: Principal | ICD-10-CM

## 2013-07-23 DIAGNOSIS — I509 Heart failure, unspecified: Secondary | ICD-10-CM

## 2013-07-23 LAB — BASIC METABOLIC PANEL
BUN: 36 mg/dL — ABNORMAL HIGH (ref 6–23)
Calcium: 9 mg/dL (ref 8.4–10.5)
Creatinine, Ser: 1.62 mg/dL — ABNORMAL HIGH (ref 0.50–1.35)
GFR calc Af Amer: 44 mL/min — ABNORMAL LOW (ref >90)
GFR calc non Af Amer: 38 mL/min — ABNORMAL LOW (ref >90)

## 2013-07-23 LAB — TROPONIN I
Troponin I: <0.3 ng/mL (ref <0.30)
Troponin I: <0.3 ng/mL (ref <0.30)

## 2013-07-23 LAB — PROTIME-INR: Prothrombin Time: 18.8 seconds — ABNORMAL HIGH (ref 11.6–15.2)

## 2013-07-23 MED ORDER — WARFARIN SODIUM 7.5 MG PO TABS
7.5000 mg | ORAL_TABLET | Freq: Once | ORAL | Status: AC
  Administered 2013-07-23: 7.5 mg via ORAL
  Filled 2013-07-23: qty 1

## 2013-07-23 NOTE — Progress Notes (Signed)
SUBJECTIVE: Gabriel Estrada is a 79 y.o. y/o male w/ PMHx of CAD (02/2001; MI/Cath/CABG x 5: LIMA->LAD, VG->D1, VG->OM2, VG->PDA->LPL & MV Ring), chronic systolic CHF, A-fib, PVD, HTN, HLD, CKD, and a recent history of a left-sided nasal lymphoma and bladder mass, presented to the ED yesterday w/ complaints of worsening dyspnea. Over the past 3-4 days, he has had worsening DOE w/ associated LE edema.   This morning, the patient was seen at bedside. He says he feels much better today and slept well. Denies any SOB, chest pain, cough, fever, chills, nausea, or vomiting. His LE's show very little edema today, lungs sound clear to auscultation bilaterally.   Filed Vitals:   07/22/13 1830 07/22/13 1845 07/22/13 1915 07/23/13 0042  BP: 110/66 119/75 125/69 111/69  Pulse: 104 102 102 94  Temp:   97.9 F (36.6 C) 97.8 F (36.6 C)  TempSrc:   Oral Oral  Resp: 18 25  22   Height:   6' (1.829 m)   Weight:   169 lb 15.6 oz (77.1 kg)   SpO2: 96% 97% 94% 93%    Intake/Output Summary (Last 24 hours) at 07/23/13 0745 Last data filed at 07/23/13 0729  Gross per 24 hour  Intake    180 ml  Output   3205 ml  Net  -3025 ml   PHYSICAL EXAM General: NAD Neck: No JVD, no thyromegaly or thyroid nodule.  Lungs: Clear to auscultation bilaterally with normal respiratory effort. No wheezes or rales.  CV: Heart regular S1/S2, no S3/S4, no murmur.  Trace pitting edema on RLE, none on LLE.  No carotid bruit. Normal pedal pulses. Port present in R. Upper chest. Abdomen: Soft, nontender, no hepatosplenomegaly, no distention.  Neurologic: Alert and oriented x 3.  Psych: Normal affect. Extremities: No clubbing or cyanosis.   LABS: Basic Metabolic Panel:  Recent Labs  45/40/98 1250 07/22/13 2100 07/23/13 0222  NA 138  --  137  K 4.7  --  4.0  CL 102  --  99  CO2 27  --  28  GLUCOSE 178*  --  95  BUN 36*  --  36*  CREATININE 1.65*  --  1.62*  CALCIUM 9.2  --  9.0  MG  --  2.1  --    Liver  Function Tests:  Recent Labs  07/22/13 1250  AST 32  ALT 37  ALKPHOS 69  BILITOT 1.1  PROT 6.3  ALBUMIN 3.3*   CBC:  Recent Labs  07/22/13 1250  WBC 5.6  HGB 11.2*  HCT 34.6*  MCV 90.6  PLT 142*   Cardiac Enzymes:  Recent Labs  07/22/13 2039 07/23/13 0222  TROPONINI <0.30 <0.30   Thyroid Function Tests:  Recent Labs  07/22/13 2100  TSH 2.815   RADIOLOGY: Dg Chest 2 View  07/22/2013   *RADIOLOGY REPORT*  Clinical Data: Chest pain and shortness of breath  CHEST - 2 VIEW  Comparison: 05/29/2013  Findings: Status post CABG.  Interval placement of right Port-A- Cath with tip to the cavoatrial junction.  There is moderate interstitial infiltrate in the right perihilar area.  There is also mild interstitial change in the left perihilar area.  There is mild vascular congestion.  IMPRESSION: Although right middle lobe pneumonia is not excluded, the findings suggest developing interstitial pulmonary edema.  Radiographic follow-up recommended.  The   Original Report Authenticated By: Esperanza Heir, M.D.   ECHO: 06/05/13 Study Conclusions: - Left ventricle: This study is severely limited. Other  sudies would be needed to be sure about LV function/EF. The septum moves. However there is suggestion of significant overall LV dysfunction. EF can not be estimated. The cavity size was normal. Wall thickness was normal. Images were inadequate for LV wall motion assessment. - Aortic valve: Sclerosis without stenosis. - Left atrium: The atrium was mildly dilated. - Right ventricle: Not able to assess RV function due to poor visualization. Not able to assess RV size due to poor visualization.  03/27/13: Study Conclusions: - Left ventricle: The cavity size was normal. Wall thickness was increased in a pattern of mild LVH. Systolic function was mildly reduced. The estimated ejection fraction was in the range of 45% to 50%. Wall motion was normal; there were no regional wall motion  abnormalities. - Mitral valve: Mild regurgitation. - Atrial septum: No defect or patent foramen ovale was identified.  TELEMETRY: Rate is regular. Pt in possible atypical flutter, rate in 90's - 100's.  ASSESSMENT AND PLAN:  79 y.o. y/o male w/ PMHx of CAD, chronic systolic CHF, A-fib, PVD, HTN, HLD, CKD, and a recent history of a left-sided nasal lymphoma and bladder mass, admitted on 07/22/13 w/ acute on chronic CHF.  Patient has been diuresing well since his admission. Lungs sound clear, LE edema is much less today. Continue diuresis w/ lasix 80 qd.  Previous ECHO did not reveal EF. Order limited ECHO study to reassess EF. Tele shows atypical flutter. Patient has hx of A-fib. Order 12 lead ECG  Signed: Lars Masson, MD 07/23/2013 7:49 AM  Patient seen with resident, agree with the above note.  1. Acute on chronic systolic CHF: Patient remains volume overloaded but improved. Continue current Coreg, enalapril, and digoxin.  Continue Lasix at 80 mg IV bid today.  Follow renal fxn closely.  Last echo difficult to interpret, would do limited echo with Definity to assess EF this admission.  2. Rhythm: Patient appears to be in an atypical atrial flutter.  Rate is controlled.  Continue warfarin.  3. CKD: Creatinine stable, follow closely with diuresis.   Marca Ancona 07/23/2013 8:32 AM

## 2013-07-23 NOTE — Progress Notes (Signed)
ANTICOAGULATION CONSULT NOTE - Initial Consult  Pharmacy Consult for coumadin Indication: atrial fibrillation  No Known Allergies  Patient Measurements: Height: 6' (182.9 cm) Weight: 169 lb 15.6 oz (77.1 kg) IBW/kg (Calculated) : 77.6  Vital Signs: Temp: 97.8 F (36.6 C) (07/29 0042) Temp src: Oral (07/29 0042) BP: 111/69 mmHg (07/29 0042) Pulse Rate: 94 (07/29 0042)  Labs:  Recent Labs  07/22/13 1250 07/22/13 2039 07/23/13 0222 07/23/13 0821  HGB 11.2*  --   --   --   HCT 34.6*  --   --   --   PLT 142*  --   --   --   LABPROT 16.7*  --  18.8*  --   INR 1.39  --  1.62*  --   CREATININE 1.65*  --  1.62*  --   TROPONINI  --  <0.30 <0.30 <0.30    Estimated Creatinine Clearance: 38.3 ml/min (by C-G formula based on Cr of 1.62).   Medical History: Past Medical History  Diagnosis Date  . Coronary artery disease     a. 02/2001 MI/Cath/CABG x 5: LIMA->LAD, VG->D1, VG->OM2, VG->PDA->LPL & MV Ring (#30 Seguin ring annuloplasty)  . PVD (peripheral vascular disease)   . Hyperlipidemia   . Mitral regurgitation     a. 02/2001 s/p #30 Seguin ring annuloplasty.  . Chronic systolic CHF (congestive heart failure)     a. 05/2013 Echo: appears to be signif overall LV dysfxn, cannot estimate EF.  Ao sclerosis.  . Hypertension   . Chronic kidney disease, unspecified   . Teeth missing     all teeth pulled-06-13-13  . A-fib     chronic-Dr. Hochrein,cardiology  . Lymphoma     Nasal Mass-left-Dr. Ha,oncology  . Bladder mass 07-03-13  . Carotid artery disease     a. 09/2012 U/S: 0-39% bilat ICA stenosis.    Medications:  See med rec  Assessment: Patient is an 79 y.o M with hx of HF and on coumadin PTA for chronic Afib. Patient has bladder tumor, s/p cystoscopy and biopsy on 07/08/13.  He reported that he was off coumadin for about 5 days prior to the biopsy procedure and was restarted back this past Wednesday (07/17/13).  His current regimen is 5mg  daily except 2.5mg  on Tuesdays and  Thursdays with last dose taken on 7/27. INR was subtherapeutic at 1.39.on admit and increased 1.62 after Coumadin 7.5mg  7/28 pm.   He stated that he has not missed any doses since Wednesday.  Goal of Therapy:  INR 2-3  Plan:   Coumadin 7.5mg  PO x1 today Daily INR  Leota Sauers Pharm.D. CPP, BCPS Clinical Pharmacist [phone] 07/23/2013 12:19 PM

## 2013-07-23 NOTE — Progress Notes (Signed)
Pt had no c/o pain, pt stated he didn't urinate a lot today, bladder scan done for 168 output of during shift, will continue to monitor, pt on IV lasix

## 2013-07-24 DIAGNOSIS — I517 Cardiomegaly: Secondary | ICD-10-CM

## 2013-07-24 LAB — BASIC METABOLIC PANEL
BUN: 49 mg/dL — ABNORMAL HIGH (ref 6–23)
Calcium: 9.2 mg/dL (ref 8.4–10.5)
Creatinine, Ser: 2.56 mg/dL — ABNORMAL HIGH (ref 0.50–1.35)
GFR calc non Af Amer: 22 mL/min — ABNORMAL LOW (ref >90)
Glucose, Bld: 100 mg/dL — ABNORMAL HIGH (ref 70–99)

## 2013-07-24 MED ORDER — SODIUM CHLORIDE 0.9 % IV SOLN
INTRAVENOUS | Status: AC
  Administered 2013-07-24: 09:00:00 via INTRAVENOUS

## 2013-07-24 MED ORDER — WARFARIN SODIUM 5 MG PO TABS
5.0000 mg | ORAL_TABLET | Freq: Once | ORAL | Status: AC
  Administered 2013-07-24: 5 mg via ORAL
  Filled 2013-07-24: qty 1

## 2013-07-24 MED ORDER — PERFLUTREN LIPID MICROSPHERE
1.0000 mL | INTRAVENOUS | Status: AC | PRN
  Administered 2013-07-24: 2 mL via INTRAVENOUS
  Filled 2013-07-24: qty 10

## 2013-07-24 MED ORDER — CARVEDILOL 12.5 MG PO TABS
12.5000 mg | ORAL_TABLET | Freq: Two times a day (BID) | ORAL | Status: DC
  Administered 2013-07-24 – 2013-07-25 (×2): 12.5 mg via ORAL
  Filled 2013-07-24 (×4): qty 1

## 2013-07-24 NOTE — Progress Notes (Signed)
Patient ID: Mukesh Kornegay, male   DOB: 1928/05/22, 79 y.o.   MRN: [id number]   SUBJECTIVE: Mr. Jung Yurchak is a 79 y.o. y/o male w/ PMHx of CAD (02/2001; MI/Cath/CABG x 5: LIMA->LAD, VG->D1, VG->OM2, VG->PDA->LPL & MV Ring), chronic systolic CHF, A-fib, PVD, HTN, HLD, CKD, and a recent history of a left-sided nasal lymphoma and bladder mass, presented to the ED yesterday w/ complaints of worsening dyspnea. Over the past 3-4 days, he had had worsening DOE w/ associated LE edema.   Today, patient reports that he feels good, dyspnea has resolved.  Creatinine, however, has increased from 1.6 => 2.56.  He got a dose of IV Lasix this morning.   Filed Vitals:   07/23/13 0513 07/23/13 1326 07/23/13 2037 07/24/13 0553  BP: 94/64 94/60 91/52  92/51  Pulse: 101 74 107 103  Temp: 97.2 F (36.2 C) 97.6 F (36.4 C) 98.4 F (36.9 C) 97.9 F (36.6 C)  TempSrc: Oral Oral Oral Oral  Resp: 22 20 20 22   Height:      Weight: 72.8 kg (160 lb 7.9 oz)   72.666 kg (160 lb 3.2 oz)  SpO2: 95% 90% 92% 93%    Intake/Output Summary (Last 24 hours) at 07/24/13 0806 Last data filed at 07/24/13 0743  Gross per 24 hour  Intake   1160 ml  Output   1780 ml  Net   -620 ml   PHYSICAL EXAM General: NAD Neck: No JVD, no thyromegaly or thyroid nodule.  Lungs: Clear to auscultation bilaterally with normal respiratory effort. No wheezes or rales.  CV: Heart regular S1/S2, no S3/S4, no murmur.  No edema.  No carotid bruit. Normal pedal pulses. Port present in R. Upper chest. Abdomen: Soft, nontender, no hepatosplenomegaly, no distention.  Neurologic: Alert and oriented x 3.  Psych: Normal affect. Extremities: No clubbing or cyanosis.   LABS: Basic Metabolic Panel:  Recent Labs  78/29/56 1250 07/22/13 2100 07/23/13 0222 07/24/13 0516  NA 138  --  137 139  K 4.7  --  4.0 3.9  CL 102  --  99 96  CO2 27  --  28 31  GLUCOSE 178*  --  95 100*  BUN 36*  --  36* 49*  CREATININE 1.65*  --  1.62* 2.56*  CALCIUM  9.2  --  9.0 9.2  MG  --  2.1  --   --    Liver Function Tests:  Recent Labs  07/22/13 1250  AST 32  ALT 37  ALKPHOS 69  BILITOT 1.1  PROT 6.3  ALBUMIN 3.3*   CBC:  Recent Labs  07/22/13 1250  WBC 5.6  HGB 11.2*  HCT 34.6*  MCV 90.6  PLT 142*   Cardiac Enzymes:  Recent Labs  07/22/13 2039 07/23/13 0222 07/23/13 0821  TROPONINI <0.30 <0.30 <0.30   Thyroid Function Tests:  Recent Labs  07/22/13 2100  TSH 2.815   RADIOLOGY: Dg Chest 2 View  07/22/2013   *RADIOLOGY REPORT*  Clinical Data: Chest pain and shortness of breath  CHEST - 2 VIEW  Comparison: 05/29/2013  Findings: Status post CABG.  Interval placement of right Port-A- Cath with tip to the cavoatrial junction.  There is moderate interstitial infiltrate in the right perihilar area.  There is also mild interstitial change in the left perihilar area.  There is mild vascular congestion.  IMPRESSION: Although right middle lobe pneumonia is not excluded, the findings suggest developing interstitial pulmonary edema.  Radiographic follow-up recommended.  The  Original Report Authenticated By: Esperanza Heir, M.D.   ECHO: 06/05/13 Study Conclusions: - Left ventricle: This study is severely limited. Other sudies would be needed to be sure about LV function/EF. The septum moves. However there is suggestion of significant overall LV dysfunction. EF can not be estimated. The cavity size was normal. Wall thickness was normal. Images were inadequate for LV wall motion assessment. - Aortic valve: Sclerosis without stenosis. - Left atrium: The atrium was mildly dilated. - Right ventricle: Not able to assess RV function due to poor visualization. Not able to assess RV size due to poor visualization.  03/27/13: Study Conclusions: - Left ventricle: The cavity size was normal. Wall thickness was increased in a pattern of mild LVH. Systolic function was mildly reduced. The estimated ejection fraction was in the range of  45% to 50%. Wall motion was normal; there were no regional wall motion abnormalities. - Mitral valve: Mild regurgitation. - Atrial septum: No defect or patent foramen ovale was identified.  TELEMETRY: Rate is regular. Pt in possible atypical flutter, rate in 90's - 100's.  ASSESSMENT AND PLAN:  79 y.o. y/o male w/ PMHx of CAD, chronic systolic CHF, A-fib, PVD, HTN, HLD, CKD, and a recent history of a left-sided nasal lymphoma and bladder mass, admitted on 07/22/13 w/ acute on chronic systolic CHF.  1. Acute on chronic systolic CHF:  Last echo difficult to interpret, would do limited echo with Definity to assess EF this admission (awaiting).  Patient looks euvolemic this morning.  Creatinine has increased to 2.56.  Unfortunately, he got a dose of IV Lasix this morning. BP soft in 90s systolic.  Patient denies lightheadedness.  - Stop Lasix - Stop enalapril - Decrease Coreg to 12.5 mg bid.  - Will give gentle IVF this morning NS @ 75 cc/hr x 5 hrs.  2. Rhythm: Patient appears to be in an atypical atrial flutter.  Rate is controlled.  Continue warfarin.  3. AKI on CKD: As above, stopping Lasix and gentle hydration.  Decreasing BP-active meds to promote higher blood pressure.  Repeat BMET tomorrow.   Marca Ancona 07/24/2013 8:06 AM

## 2013-07-24 NOTE — Progress Notes (Signed)
Pt is alert and oriented x4 and on 2L O2. Pt had an Echo today. Pt worked with PT and OT during shift.

## 2013-07-24 NOTE — Progress Notes (Signed)
ANTICOAGULATION CONSULT NOTE - Follow Up Consult  Pharmacy Consult for Coumadin Indication: atrial fibrillation  No Known Allergies  Patient Measurements: Height: 6' (182.9 cm) Weight: 160 lb 3.2 oz (72.666 kg) (scale b) IBW/kg (Calculated) : 77.6   Vital Signs: Temp: 97.9 F (36.6 C) (07/30 0553) Temp src: Oral (07/30 0553) BP: 92/51 mmHg (07/30 0553) Pulse Rate: 103 (07/30 0553)  Labs:  Recent Labs  07/22/13 1250 07/22/13 2039 07/23/13 0222 07/23/13 0821 07/24/13 0516  HGB 11.2*  --   --   --   --   HCT 34.6*  --   --   --   --   PLT 142*  --   --   --   --   LABPROT 16.7*  --  18.8*  --  23.2*  INR 1.39  --  1.62*  --  2.14*  CREATININE 1.65*  --  1.62*  --  2.56*  TROPONINI  --  <0.30 <0.30 <0.30  --     Estimated Creatinine Clearance: 22.9 ml/min (by C-G formula based on Cr of 2.56).   Medications:  Prescriptions prior to admission  Medication Sig Dispense Refill  . carvedilol (COREG) 25 MG tablet Take 25 mg by mouth 2 (two) times daily with a meal.      . digoxin (LANOXIN) 0.125 MG tablet Take 0.125 mg by mouth every evening.       . docusate sodium (COLACE) 100 MG capsule Take 1 capsule (100 mg total) by mouth 2 (two) times daily.  30 capsule  0  . enalapril (VASOTEC) 10 MG tablet Take 5 mg by mouth every evening.       . ezetimibe-simvastatin (VYTORIN) 10-40 MG per tablet Take 1 tablet by mouth at bedtime.      [name] HYDROcodone-acetaminophen (NORCO/VICODIN) 5-325 MG per tablet Take 1-2 tablets by mouth every 6 (six) hours as needed for pain.  25 tablet  0  . lidocaine-prilocaine (EMLA) cream Apply topically as needed.  30 g  2  . Multiple Vitamin (MULTIVITAMIN WITH MINERALS) TABS Take 1 tablet by mouth daily.      . ondansetron (ZOFRAN) 8 MG tablet Take 1 tablet (8 mg total) by mouth every 8 (eight) hours as needed for nausea.  20 tablet  0  . oxyCODONE-acetaminophen (PERCOCET) 5-325 MG per tablet Take one or two tablets by mouth every 6 hours as needed for  pain.  30 tablet  0  . phenazopyridine (PYRIDIUM) 100 MG tablet Take 1 tablet (100 mg total) by mouth 3 (three) times daily as needed for pain (for burning).  20 tablet  0  . prochlorperazine (COMPAZINE) 10 MG tablet Take 1 tablet (10 mg total) by mouth every 6 (six) hours as needed.  30 tablet  3  . tamsulosin (FLOMAX) 0.4 MG CAPS Take 0.4 mg by mouth 1 day or 1 dose. bedtime      . warfarin (COUMADIN) 5 MG tablet Take 2.5-5 mg by mouth daily. Take 5mg  daily except 2.5mg  on Tuesdays and Thursdays        Assessment: Patient is an 79 y.o M, presented to ED on 7/29 with c/o SOB and lower extremity edema. He has hx of HF and on coumadin PTA for chronic Afib. Patient has bladder tumor, s/p cystoscopy and biopsy on 07/08/13. He reported that he was off coumadin for about 5 days prior to the biopsy procedure and was restarted back this past Wednesday (07/17/13). His current regimen is 5mg  daily except 2.5mg  on Tuesdays  and Thursdays with last dose taken on 7/27. INR is therapeutic at 2.14 today. Will back off on coumadin dose due to jump in INR from 1.62.   Goal of Therapy:  INR 2-3 Monitor platelets by anticoagulation protocol: Yes   Plan:  1. Change to Coumadin 5 mg X1 2. Daily INR 3. F/u 72h CBC (on 7/31) 4. Coumadin education  Anabel Bene, PharmD Clinical Pharmacist[facility]: [phone] 07/24/2013 8:54 AM

## 2013-07-24 NOTE — Evaluation (Signed)
Physical Therapy Evaluation Patient Details Name: Gabriel Estrada MRN: [id number] DOB: [birth year] Today's Date: 07/24/2013 Time: 4098-1191 PT Time Calculation (min): 21 min  PT Assessment / Plan / Recommendation History of Present Illness  79 y/o male with h/o CAD and systolic CHF who presented to the ED today with a 3-4 day h/o progressive dyspnea and LE edema  Clinical Impression  Pt is supervision level for mobility for safety and balance deficits. Pt with overall poor endurance. Pt lives with sister in [place]. Pt to benefit from skilled PT to address deficits listed below. Would benefit from dynamic balance activities to reduce risk of falls. Recommend HHPT upon acute D/C to ensure safe D/C home. Pt refusing to amb with RW; insists on using SPC, even though he would benefit and be more stable with RW. Pt educated on energy conservation due to O2 stats dropping; required 2L O2 to stay >90% during amb.     PT Assessment  Patient needs continued PT services    Follow Up Recommendations  Home health PT;Supervision - Intermittent    Does the patient have the potential to tolerate intense rehabilitation      Barriers to Discharge        Equipment Recommendations  None recommended by PT (pt refusing to amb with RW at this time)    Recommendations for Other Services     Frequency Min 3X/week    Precautions / Restrictions Precautions Precautions: Fall Restrictions Weight Bearing Restrictions: No   Pertinent Vitals/Pain No c/o pain.       Mobility  Bed Mobility Bed Mobility: Not assessed Details for Bed Mobility Assistance: pt sitting in chair and returned to chair  Transfers Transfers: Sit to Stand;Stand to Sit Sit to Stand: 5: Supervision;With armrests;From chair/3-in-1 Stand to Sit: 5: Supervision;To chair/3-in-1;With armrests Details for Transfer Assistance: supervision for safety; no physical (A) needed  Ambulation/Gait Ambulation/Gait Assistance: 5:  Supervision Ambulation Distance (Feet): 150 Feet Assistive device: 1 person hand held assist Ambulation/Gait Assistance Details: +1 HHA to simulate SPC; pt did not have SPC with him at this time but prefers to amb with SPC vs. RW; pt slightly unsteady when challenged dynamically (ie head turns, directional changes) Gait Pattern: Step-through pattern;Decreased stride length;Narrow base of support;Trunk flexed Gait velocity: decreased  General Gait Details: pt able to converse while amb; attempted to amb on RA O2 destat to 78-81% Stairs: No Wheelchair Mobility Wheelchair Mobility: No    Exercises     PT Diagnosis: Difficulty walking  PT Problem List: Decreased balance;Decreased activity tolerance;Decreased mobility PT Treatment Interventions: DME instruction;Gait training;Stair training;Functional mobility training;Therapeutic exercise;Therapeutic activities;Balance training;Neuromuscular re-education;Patient/family education     PT Goals(Current goals can be found in the care plan section) Acute Rehab PT Goals Patient Stated Goal: to go home with sister PT Goal Formulation: With patient Time For Goal Achievement: 07/31/13 Potential to Achieve Goals: Good  Visit Information  Last PT Received On: 07/24/13 Assistance Needed: +1 History of Present Illness: 79 y/o male with h/o CAD and systolic CHF who presented to the ED today with a 3-4 day h/o progressive dyspnea and LE edema       Prior Functioning  Home Living Family/patient expects to be discharged to:: Private residence Living Arrangements: Other relatives Available Help at Discharge: Family Type of Home: House Home Access: Stairs to enter Secretary/administrator of Steps: 6 Entrance Stairs-Rails: Right;Left;Can reach both Home Layout: Two level;Bed/bath upstairs Home Equipment: Cane - single point Prior Function Level of Independence: Independent Communication Communication:  No difficulties Dominant Hand: Right     Cognition  Cognition Arousal/Alertness: Awake/alert Behavior During Therapy: WFL for tasks assessed/performed Overall Cognitive Status: Within Functional Limits for tasks assessed    Extremity/Trunk Assessment Upper Extremity Assessment Upper Extremity Assessment: Overall WFL for tasks assessed Lower Extremity Assessment Lower Extremity Assessment: Overall WFL for tasks assessed Cervical / Trunk Assessment Cervical / Trunk Assessment: Kyphotic (slight thoracic kyphosis )   Balance Balance Balance Assessed: Yes Static Standing Balance Static Standing - Balance Support: No upper extremity supported;During functional activity Static Standing - Level of Assistance: 6: Modified independent (Device/Increase time) Dynamic Standing Balance Dynamic Standing - Balance Support: No upper extremity supported;During functional activity Dynamic Standing - Level of Assistance: 5: Stand by assistance  End of Session PT - End of Session Equipment Utilized During Treatment: Oxygen (2L) Activity Tolerance: Patient tolerated treatment well Patient left: in chair;with call bell/phone within reach Nurse Communication: Mobility status  GP     Donell Sievert, [place] [phone] 07/24/2013, 1:59 PM

## 2013-07-24 NOTE — Progress Notes (Signed)
SATURATION QUALIFICATIONS: (This note is used to comply with regulatory documentation for home oxygen)  Patient Saturations on Room Air at Rest = 92%  Patient Saturations on Room Air while Ambulating = 78%  Patient Saturations on 3 Liters of oxygen while Ambulating = 88%  Please briefly explain why patient needs home oxygen: pt ambulated 50 feet, O2 dropped to 78% while ambulating on room, pt stopped to rest and put on 4L top recover, once pt recovered O2 put on 3L O2 maintained 88-91%

## 2013-07-24 NOTE — Evaluation (Signed)
Occupational Therapy Evaluation Patient Details Name: Gabriel Estrada MRN: [id number] DOB: 10-12-1928 Today's Date: 07/24/2013 Time: 4098-1191 OT Time Calculation (min): 23 min  OT Assessment / Plan / Recommendation History of present illness 79 y/o male with h/o CAD and systolic CHF who presented to the ED today with a 3-4 day h/o progressive dyspnea and LE edema   Clinical Impression   Pt at supervision level with ADLs and ADL mobility for safety due to dropping O2 SATs, low endurance during ADL mobility. No further acute OT services indicated at this time, OT will sign off. Pt should continue to benefit from acute PT services for functional mobility safety    OT Assessment  Patient does not need any further OT services    Follow Up Recommendations  No OT follow up    Barriers to Discharge  None    Equipment Recommendations  None recommended by OT    Recommendations for Other Services    Frequency       Precautions / Restrictions Precautions Precautions: Fall Restrictions Weight Bearing Restrictions: No   Pertinent Vitals/Pain No c/o pain    ADL  Grooming: Performed;Wash/dry hands;Wash/dry face;Supervision/safety Upper Body Bathing: Supervision/safety;Simulated Lower Body Bathing: Supervision/safety;Simulated Upper Body Dressing: Supervision/safety;Performed Lower Body Dressing: Performed;Supervision/safety Toilet Transfer: Performed;Supervision/safety;Other (comment) (pt stood at toilet for urination) Toilet Transfer Method: Sit to stand Toilet Transfer Equipment: Regular height toilet;Grab bars Toileting - Clothing Manipulation and Hygiene: Supervision/safety;Performed Where Assessed - Toileting Clothing Manipulation and Hygiene: Standing Tub/Shower Transfer Method: Not assessed Transfers/Ambulation Related to ADLs: supervision during ADL  mobility due to O2 SATs dropping to 82% during mobility on RA, pt put on  1 -2 L O2. Pt recovered quickly after stopping mobility  and deep breathing techniques ADL Comments: sup for saftey with ADLs during mobility     OT Diagnosis:    OT Problem List:   OT Treatment Interventions:     OT Goals(Current goals can be found in the care plan section) Acute Rehab OT Goals Patient Stated Goal: to go home with sister  Visit Information  Last OT Received On: 07/24/13 Assistance Needed: +1 PT/OT Co-Evaluation/Treatment: Yes History of Present Illness: 79 y/o male with h/o CAD and systolic CHF who presented to the ED today with a 3-4 day h/o progressive dyspnea and LE edema       Prior Functioning     Home Living Family/patient expects to be discharged to:: Private residence Living Arrangements: Other relatives Available Help at Discharge: Family Type of Home: House Home Access: Stairs to enter Secretary/administrator of Steps: 6 Entrance Stairs-Rails: Right;Left;Can reach both Home Layout: Two level;Bed/bath upstairs Home Equipment: Cane - single point Prior Function Level of Independence: Independent Communication Communication: No difficulties Dominant Hand: Right         Vision/Perception Vision - History Baseline Vision: Wears glasses only for reading Patient Visual Report: No change from baseline Perception Perception: Within Functional Limits   Cognition  Cognition Arousal/Alertness: Awake/alert Behavior During Therapy: WFL for tasks assessed/performed Overall Cognitive Status: Within Functional Limits for tasks assessed    Extremity/Trunk Assessment Upper Extremity Assessment Upper Extremity Assessment: Overall WFL for tasks assessed Cervical / Trunk Assessment Cervical / Trunk Assessment: Kyphotic (slight thoracic kyphosis )     Mobility Bed Mobility Bed Mobility: Not assessed Details for Bed Mobility Assistance: pt sitting in chair and returned to chair  Transfers Sit to Stand: 5: Supervision;With armrests;From chair/3-in-1 Stand to Sit: 5: Supervision;To chair/3-in-1;With  armrests Details for Transfer Assistance: supervision for safety;  no physical (A) needed      Exercise     Balance Balance Balance Assessed: Yes Static Standing Balance Static Standing - Balance Support: No upper extremity supported;During functional activity Static Standing - Level of Assistance: 6: Modified independent (Device/Increase time) Dynamic Standing Balance Dynamic Standing - Balance Support: No upper extremity supported;During functional activity Dynamic Standing - Level of Assistance: 5: Stand by assistance   End of Session OT - End of Session Equipment Utilized During Treatment: Oxygen Activity Tolerance: Patient tolerated treatment well Patient left: in chair;with call bell/phone within reach  GO     [name] 07/24/2013, 1:43 PM

## 2013-07-24 NOTE — Progress Notes (Signed)
Echocardiogram 2D Echocardiogram limited with Definity has been performed.  Gabriel Estrada 07/24/2013, 4:12 PM

## 2013-07-24 NOTE — Plan of Care (Signed)
Problem: Phase II Progression Outcomes Goal: Discharge plan established No follow up OT recommended after acute care d/c at this time     

## 2013-07-25 ENCOUNTER — Inpatient Hospital Stay (HOSPITAL_COMMUNITY): Payer: Medicare Other

## 2013-07-25 ENCOUNTER — Ambulatory Visit: Payer: Medicare Other | Admitting: Oncology

## 2013-07-25 ENCOUNTER — Ambulatory Visit: Payer: Medicare Other

## 2013-07-25 ENCOUNTER — Other Ambulatory Visit: Payer: Medicare Other | Admitting: Lab

## 2013-07-25 DIAGNOSIS — I4891 Unspecified atrial fibrillation: Secondary | ICD-10-CM

## 2013-07-25 LAB — PROTIME-INR
INR: 2.73 — ABNORMAL HIGH (ref 0.00–1.49)
Prothrombin Time: 28 seconds — ABNORMAL HIGH (ref 11.6–15.2)

## 2013-07-25 LAB — URINALYSIS, ROUTINE W REFLEX MICROSCOPIC
Bilirubin Urine: NEGATIVE
Ketones, ur: NEGATIVE mg/dL
Nitrite: NEGATIVE
Protein, ur: NEGATIVE mg/dL
pH: 6 (ref 5.0–8.0)

## 2013-07-25 LAB — BASIC METABOLIC PANEL
Calcium: 8.8 mg/dL (ref 8.4–10.5)
GFR calc Af Amer: 24 mL/min — ABNORMAL LOW (ref >90)
GFR calc non Af Amer: 21 mL/min — ABNORMAL LOW (ref >90)
Potassium: 4.5 mEq/L (ref 3.5–5.1)
Sodium: 138 mEq/L (ref 135–145)

## 2013-07-25 LAB — URINE MICROSCOPIC-ADD ON

## 2013-07-25 MED ORDER — WARFARIN SODIUM 2.5 MG PO TABS
2.5000 mg | ORAL_TABLET | Freq: Once | ORAL | Status: AC
  Administered 2013-07-25: 2.5 mg via ORAL
  Filled 2013-07-25: qty 1

## 2013-07-25 MED ORDER — CARVEDILOL 3.125 MG PO TABS: 3.1250 mg | ORAL_TABLET | Freq: Two times a day (BID) | ORAL | Status: DC

## 2013-07-25 MED ORDER — CARVEDILOL 3.125 MG PO TABS
3.1250 mg | ORAL_TABLET | Freq: Two times a day (BID) | ORAL | Status: DC
  Administered 2013-07-25 – 2013-07-26 (×2): 3.125 mg via ORAL
  Filled 2013-07-25 (×4): qty 1

## 2013-07-25 MED ORDER — SODIUM CHLORIDE 0.9 % IV SOLN
INTRAVENOUS | Status: AC
  Administered 2013-07-25: 08:00:00 via INTRAVENOUS

## 2013-07-25 MED ORDER — CARVEDILOL 6.25 MG PO TABS: 6.2500 mg | ORAL_TABLET | Freq: Two times a day (BID) | ORAL | Status: DC

## 2013-07-25 NOTE — Progress Notes (Signed)
Patient is stable, and is on room air within normal limits. Patient started on IV fluids at 62ml/hr.

## 2013-07-25 NOTE — Progress Notes (Signed)
Patient ID: Gabriel Estrada, male   DOB: [birth year], 79 y.o.   MRN: [id number]   SUBJECTIVE: [name] is a 79 y.o. y/o male w/ PMHx of CAD (02/2001; MI/Cath/CABG x 5: LIMA->LAD, VG->D1, VG->OM2, VG->PDA->LPL & MV Ring), chronic systolic CHF, A-fib, PVD, HTN, HLD, CKD, and a recent history of a left-sided nasal lymphoma and bladder mass, presented to the ED yesterday w/ complaints of worsening dyspnea. Over the past 3-4 days, he had had worsening DOE w/ associated LE edema.  He diuresed well initially in hospital but developed AKI.  Lasix stopped yesterday.  Today, patient reports that he feels good, dyspnea has resolved.  He does drop oxygen saturation still with ambulation.  Creatinine slightly worse this morning. SBP continues to run low.  He says it is "always low" at home.    Filed Vitals:   07/24/13 1300 07/24/13 2047 07/25/13 0500 07/25/13 0610  BP: 94/50 87/49  93/48  Pulse: 99 58  103  Temp: 97.5 F (36.4 C) 98 F (36.7 C)  97.9 F (36.6 C)  TempSrc: Oral Oral  Oral  Resp: 22 18  18   Height:      Weight:   73.392 kg (161 lb 12.8 oz) 73.392 kg (161 lb 12.8 oz)  SpO2: 99% 94%  96%    Intake/Output Summary (Last 24 hours) at 07/25/13 0745 Last data filed at 07/25/13 0729  Gross per 24 hour  Intake    843 ml  Output   1475 ml  Net   -632 ml   PHYSICAL EXAM General: NAD Neck: No JVD, no thyromegaly or thyroid nodule.  Lungs: Mild crackles right base.   CV: Heart regular S1/S2, no S3/S4, no murmur.  No edema.  No carotid bruit. Normal pedal pulses. Port present in R. Upper chest. Abdomen: Soft, nontender, no hepatosplenomegaly, no distention.  Neurologic: Alert and oriented x 3.  Psych: Normal affect. Extremities: No clubbing or cyanosis.   LABS: Basic Metabolic Panel:  Recent Labs  56/21/30 1250 07/22/13 2100  07/24/13 0516 07/25/13 0430  NA 138  --   < > 139 138  K 4.7  --   < > 3.9 4.5  CL 102  --   < > 96 97  CO2 27  --   < > 31 32  GLUCOSE 178*  --    < > 100* 97  BUN 36*  --   < > 49* 61*  CREATININE 1.65*  --   < > 2.56* 2.69*  CALCIUM 9.2  --   < > 9.2 8.8  MG  --  2.1  --   --   --   < > = values in this interval not displayed. Liver Function Tests:  Recent Labs  07/22/13 1250  AST 32  ALT 37  ALKPHOS 69  BILITOT 1.1  PROT 6.3  ALBUMIN 3.3*   CBC:  Recent Labs  07/22/13 1250  WBC 5.6  HGB 11.2*  HCT 34.6*  MCV 90.6  PLT 142*   Cardiac Enzymes:  Recent Labs  07/22/13 2039 07/23/13 0222 07/23/13 0821  TROPONINI <0.30 <0.30 <0.30   Thyroid Function Tests:  Recent Labs  07/22/13 2100  TSH 2.815   RADIOLOGY: Dg Chest 2 View  07/22/2013   *RADIOLOGY REPORT*  Clinical Data: Chest pain and shortness of breath  CHEST - 2 VIEW  Comparison: 05/29/2013  Findings: Status post CABG.  Interval placement of right Port-A- Cath with tip to the cavoatrial junction.  There is  moderate interstitial infiltrate in the right perihilar area.  There is also mild interstitial change in the left perihilar area.  There is mild vascular congestion.  IMPRESSION: Although right middle lobe pneumonia is not excluded, the findings suggest developing interstitial pulmonary edema.  Radiographic follow-up recommended.  The   Original Report Authenticated By: Esperanza Heir, M.D.   ECHO: 06/05/13 Study Conclusions: - Left ventricle: This study is severely limited. Other sudies would be needed to be sure about LV function/EF. The septum moves. However there is suggestion of significant overall LV dysfunction. EF can not be estimated. The cavity size was normal. Wall thickness was normal. Images were inadequate for LV wall motion assessment. - Aortic valve: Sclerosis without stenosis. - Left atrium: The atrium was mildly dilated. - Right ventricle: Not able to assess RV function due to poor visualization. Not able to assess RV size due to poor visualization.  03/27/13: Study Conclusions: - Left ventricle: The cavity size was normal.  Wall thickness was increased in a pattern of mild LVH. Systolic function was mildly reduced. The estimated ejection fraction was in the range of 45% to 50%. Wall motion was normal; there were no regional wall motion abnormalities. - Mitral valve: Mild regurgitation. - Atrial septum: No defect or patent foramen ovale was identified.  TELEMETRY: Rate is regular. Pt in possible atypical flutter, rate in 90's - 100's.  ASSESSMENT AND PLAN:  79 y.o. y/o male w/ PMHx of CAD, chronic systolic CHF, A-fib, PVD, HTN, HLD, CKD, and a recent history of a left-sided nasal lymphoma and bladder mass, admitted on 07/22/13 w/ acute on chronic systolic CHF.  1. Acute CHF:  Echo difficult to interpret even with Definity.  Possibly low normal to normal EF.  Patient looks euvolemic this morning on exam.  Creatinine has increased to 2.69.  BP continues to be soft, SBP 90s.  Patient denies lightheadedness.  - Decrease Coreg to 3.125 mg bid - Continue to hold Lasix and enalapril.  2. Rhythm: Patient appears to be in an atypical atrial flutter.  Rate is controlled.  Continue warfarin and lower dose Coreg.  3. AKI on CKD: Decreasing BP-active meds to promote higher blood pressure.  Repeat BMET tomorrow.  I will again give a small amount of NS gently.   4. Hypotension: Patient says BP "always runs low" but was higher earlier in his stay.  I will check for infection with UA and repeat CXR.  He has been afebrile and WBCs not elevated on 7/28.   Marca Ancona 07/25/2013 7:45 AM

## 2013-07-25 NOTE — Clinical Documentation Improvement (Signed)
THIS DOCUMENT IS NOT A PERMANENT PART OF THE MEDICAL RECORD  Please update your documentation with the medical record to reflect your response to this query. If you need help knowing how to do this please call [phone].  07/25/13   Dr. Shirlee Latch,  In a better effort to capture your patient's severity of illness, reflect appropriate length of stay and utilization of resources, a review of the patient medical record has revealed the following indicators:   - Admitted with Heart Failure   - Known history of Hypertension per H&P     Based on your clinical judgment, please document in the progress notes and discharge summary if a condition below provides greater specificity regarding the patient's HTN and Heart Failure:   - Hypertensive Heart Disease   - Other Condition   - Unable to Clinically Determine  08/05/13 - query withdrawn - htn, chf, mild lvh, heart wnl per cxr.  Mathis Dad RN   In responding to this query please exercise your independent judgment.    The fact that a query is asked, does not imply that any particular answer is desired or expected.   Thank You,  Jerral Ralph  RN BSN CCDS CertifiedClinical Documentation Specialist [phone] Health Information Management [place]

## 2013-07-25 NOTE — Progress Notes (Signed)
ANTICOAGULATION CONSULT NOTE - Follow Up Consult  Pharmacy Consult for Coumadin Indication: atrial fibrillation  No Known Allergies  Patient Measurements: Height: 6' (182.9 cm) Weight: 161 lb 12.8 oz (73.392 kg) (scale B) IBW/kg (Calculated) : 77.6  Vital Signs: Temp: 97.9 F (36.6 C) (07/31 0610) Temp src: Oral (07/31 0610) BP: 94/70 mmHg (07/31 0922) Pulse Rate: 99 (07/31 0922)  Labs:  Recent Labs  07/22/13 2039 07/23/13 0222 07/23/13 0821 07/24/13 0516 07/25/13 0430  LABPROT  --  18.8*  --  23.2* 28.0*  INR  --  1.62*  --  2.14* 2.73*  CREATININE  --  1.62*  --  2.56* 2.69*  TROPONINI <0.30 <0.30 <0.30  --   --     Estimated Creatinine Clearance: 22 ml/min (by C-G formula based on Cr of 2.69).   Medications:  Prescriptions prior to admission  Medication Sig Dispense Refill  . carvedilol (COREG) 25 MG tablet Take 25 mg by mouth 2 (two) times daily with a meal.      . digoxin (LANOXIN) 0.125 MG tablet Take 0.125 mg by mouth every evening.       . docusate sodium (COLACE) 100 MG capsule Take 1 capsule (100 mg total) by mouth 2 (two) times daily.  30 capsule  0  . enalapril (VASOTEC) 10 MG tablet Take 5 mg by mouth every evening.       . ezetimibe-simvastatin (VYTORIN) 10-40 MG per tablet Take 1 tablet by mouth at bedtime.      [name] HYDROcodone-acetaminophen (NORCO/VICODIN) 5-325 MG per tablet Take 1-2 tablets by mouth every 6 (six) hours as needed for pain.  25 tablet  0  . lidocaine-prilocaine (EMLA) cream Apply topically as needed.  30 g  2  . Multiple Vitamin (MULTIVITAMIN WITH MINERALS) TABS Take 1 tablet by mouth daily.      . ondansetron (ZOFRAN) 8 MG tablet Take 1 tablet (8 mg total) by mouth every 8 (eight) hours as needed for nausea.  20 tablet  0  . oxyCODONE-acetaminophen (PERCOCET) 5-325 MG per tablet Take one or two tablets by mouth every 6 hours as needed for pain.  30 tablet  0  . phenazopyridine (PYRIDIUM) 100 MG tablet Take 1 tablet (100 mg total) by  mouth 3 (three) times daily as needed for pain (for burning).  20 tablet  0  . prochlorperazine (COMPAZINE) 10 MG tablet Take 1 tablet (10 mg total) by mouth every 6 (six) hours as needed.  30 tablet  3  . tamsulosin (FLOMAX) 0.4 MG CAPS Take 0.4 mg by mouth 1 day or 1 dose. bedtime      . warfarin (COUMADIN) 5 MG tablet Take 2.5-5 mg by mouth daily. Take 5mg  daily except 2.5mg  on Tuesdays and Thursdays        Assessment: Patient is an 79 y.o M, presented to ED on 7/29 with c/o SOB and lower extremity edema. He has hx of HF and on coumadin PTA for chronic Afib. Patient has bladder tumor, s/p cystoscopy and biopsy on 07/08/13. He reported that he was off coumadin for about 5 days prior to the biopsy procedure and was restarted back this past Wednesday (07/17/13). His current regimen is 5mg  daily except 2.5mg  on Tuesdays and Thursdays with last dose taken on 7/27.   INR is therapeutic at 2.73 today from 2.14 after 5mg  last pm -will continue on coumadin with a dose decrease due to INR jump despite dose decrease yesterday.  Goal of Therapy:  INR 2-3 Monitor  platelets by anticoagulation protocol: Yes   Plan:  1. Coumadin 2.5mg  x1 2. Daily INR 3. F/u CBC, signs of bleeding  Anabel Bene, PharmD Clinical Pharmacist Pager: [phone]

## 2013-07-25 NOTE — Clinical Social Work Note (Signed)
CSW received a call from niece re: home care for pt.  Niece is requesting staff review pt needs regarding home equipment.  CSW made RNCM aware.    Per Niece, her mother, Jerrye Beavers (pt sister) transports pt to and from appointments and grocery store.  [name], pt sister, can be reached at [phone].  Vickii Penna, LCSWA [phone]  Clinical Social Work

## 2013-07-26 ENCOUNTER — Telehealth: Payer: Self-pay | Admitting: Cardiology

## 2013-07-26 ENCOUNTER — Other Ambulatory Visit: Payer: Self-pay | Admitting: Nurse Practitioner

## 2013-07-26 ENCOUNTER — Encounter (HOSPITAL_COMMUNITY): Payer: Self-pay | Admitting: Nurse Practitioner

## 2013-07-26 DIAGNOSIS — N189 Chronic kidney disease, unspecified: Secondary | ICD-10-CM

## 2013-07-26 DIAGNOSIS — I5021 Acute systolic (congestive) heart failure: Secondary | ICD-10-CM

## 2013-07-26 DIAGNOSIS — N179 Acute kidney failure, unspecified: Secondary | ICD-10-CM

## 2013-07-26 DIAGNOSIS — I5023 Acute on chronic systolic (congestive) heart failure: Secondary | ICD-10-CM

## 2013-07-26 LAB — CBC
HCT: 35.2 % — ABNORMAL LOW (ref 39.0–52.0)
MCH: 29.4 pg (ref 26.0–34.0)
MCV: 90.7 fL (ref 78.0–100.0)
RBC: 3.88 MIL/uL — ABNORMAL LOW (ref 4.22–5.81)
WBC: 5.7 10*3/uL (ref 4.0–10.5)

## 2013-07-26 LAB — BASIC METABOLIC PANEL
CO2: 29 mEq/L (ref 19–32)
Calcium: 8.7 mg/dL (ref 8.4–10.5)
Chloride: 100 mEq/L (ref 96–112)
Creatinine, Ser: 2.02 mg/dL — ABNORMAL HIGH (ref 0.50–1.35)
Glucose, Bld: 93 mg/dL (ref 70–99)

## 2013-07-26 LAB — PROTIME-INR: INR: 2.42 — ABNORMAL HIGH (ref 0.00–1.49)

## 2013-07-26 MED ORDER — CARVEDILOL 6.25 MG PO TABS
6.2500 mg | ORAL_TABLET | Freq: Two times a day (BID) | ORAL | Status: DC
Start: 2013-07-26 — End: 2013-07-26
  Filled 2013-07-26 (×2): qty 1

## 2013-07-26 MED ORDER — FUROSEMIDE 20 MG PO TABS: 20.0000 mg | ORAL_TABLET | Freq: Every day | ORAL | Status: DC

## 2013-07-26 MED ORDER — CARVEDILOL 6.25 MG PO TABS: 6.2500 mg | ORAL_TABLET | Freq: Two times a day (BID) | ORAL | Status: DC

## 2013-07-26 MED ORDER — WARFARIN SODIUM 5 MG PO TABS
5.0000 mg | ORAL_TABLET | Freq: Once | ORAL | Status: DC
  Filled 2013-07-26: qty 1

## 2013-07-26 NOTE — Progress Notes (Signed)
Physical Therapy Treatment Patient Details Name: Gabriel Estrada MRN: [id number] DOB: [birth year] Today's Date: 07/26/2013 Time: 4696-2952 PT Time Calculation (min): 23 min  PT Assessment / Plan / Recommendation  History of Present Illness 79 y/o male with h/o CAD and systolic CHF who presented to the ED today with a 3-4 day h/o progressive dyspnea and LE edema   PT Comments   Pt very pleasant & willing to participate in PT session.  Increased ambulation distance & performed flight of stairs in stairwell but on way back to room RN requested to terminate session due to pt's HR increasing to 180's.      Follow Up Recommendations  Home health PT;Supervision - Intermittent     Does the patient have the potential to tolerate intense rehabilitation     Barriers to Discharge        Equipment Recommendations  None recommended by PT    Recommendations for Other Services    Frequency Min 3X/week   Progress towards PT Goals Progress towards PT goals: Progressing toward goals  Plan Current plan remains appropriate    Precautions / Restrictions Restrictions Weight Bearing Restrictions: No   Pertinent Vitals/Pain HR 180's with activity.  Session terminated.      Mobility  Bed Mobility Bed Mobility: Not assessed Transfers Transfers: Sit to Stand;Stand to Sit Sit to Stand: 6: Modified independent (Device/Increase time);With upper extremity assist;With armrests;From chair/3-in-1 Stand to Sit: 6: Modified independent (Device/Increase time);With upper extremity assist;With armrests;To chair/3-in-1 Details for Transfer Assistance: Performed with safe technique.  Ambulation/Gait Ambulation/Gait Assistance: 5: Supervision Ambulation Distance (Feet): 200 Feet Assistive device: Straight cane Ambulation/Gait Assistance Details: Pt used SPC.  Slow & steady gait with small step/stride without challenges but mild unsteadiness with challenges such as scanning enviornment & directional changes but no  overt LOB noted.   Gait Pattern: Step-through pattern;Decreased stride length;Narrow base of support;Trunk flexed Gait velocity: decreased  General Gait Details: Pt ambulated without supplemental 02 per his request.  No SOB noted.   RN requested to terminate ambulation due to HR increased to 180's.   Stairs: Yes Stairs Assistance: 4: Min guard Stairs Assistance Details (indicate cue type and reason): Pt used rail on Lt side + cane to ascend steps & bil rails to descend.  Encouraged step-to pattern but pt performing alterning stepping.   Stair Management Technique: Two rails;Alternating pattern;Forwards;One rail Left Number of Stairs: 10 Wheelchair Mobility Wheelchair Mobility: No      PT Goals (current goals can now be found in the care plan section) Acute Rehab PT Goals Patient Stated Goal: to go home with sister PT Goal Formulation: With patient Time For Goal Achievement: 07/31/13 Potential to Achieve Goals: Good  Visit Information  Last PT Received On: 07/26/13 Assistance Needed: +1 History of Present Illness: 79 y/o male with h/o CAD and systolic CHF who presented to the ED today with a 3-4 day h/o progressive dyspnea and LE edema    Subjective Data  Patient Stated Goal: to go home with sister   Cognition  Cognition Arousal/Alertness: Awake/alert Behavior During Therapy: WFL for tasks assessed/performed Overall Cognitive Status: Within Functional Limits for tasks assessed    Balance     End of Session PT - End of Session Equipment Utilized During Treatment: Gait belt Activity Tolerance:  (increased HR to 180's.  ) Patient left: in chair;with call bell/phone within reach;with family/visitor present Nurse Communication: Mobility status   GP     Lara Mulch 07/26/2013, 2:18 PM   Tresa Endo  [place], [place] [phone] 07/26/2013

## 2013-07-26 NOTE — Progress Notes (Signed)
Client verbalized understanding of discharge instructions.  Cane with client upon discharge.  Heart rate elevated during PT and was seen by PA prior to release.

## 2013-07-26 NOTE — Progress Notes (Signed)
See nursing notes.  Remains alert and oriented

## 2013-07-26 NOTE — Progress Notes (Signed)
Patient ID: Gabriel Estrada, male   DOB: 07-09-1928, 79 y.o.   MRN: [id number]   SUBJECTIVE: [name] is a 79 y.o. y/o male w/ PMHx of CAD (02/2001; MI/Cath/CABG x 5: LIMA->LAD, VG->D1, VG->OM2, VG->PDA->LPL & MV Ring), chronic systolic CHF, A-fib, PVD, HTN, HLD, CKD, and a recent history of a left-sided nasal lymphoma and bladder mass, presented to the ED w/ complaints of worsening dyspnea. Over the past 3-4 days, he had had worsening DOE w/ associated LE edema.  He diuresed well initially in hospital but developed AKI.  Lasix stopped.  Patient denies dyspnea or chest pain today.   Filed Vitals:   07/25/13 1840 07/25/13 2023 07/26/13 0227 07/26/13 0647  BP:  98/52 123/98 107/61  Pulse:  95 95 97  Temp:  98 F (36.7 C) 98.1 F (36.7 C) 97.7 F (36.5 C)  TempSrc:  Oral Oral Oral  Resp:  19 18 18   Height:      Weight:    164 lb 3.9 oz (74.5 kg)  SpO2: 94% 93% 95% 98%    Intake/Output Summary (Last 24 hours) at 07/26/13 0804 Last data filed at 07/26/13 0700  Gross per 24 hour  Intake    740 ml  Output   1055 ml  Net   -315 ml   PHYSICAL EXAM General: NAD Neck: No JVD Lungs: Mild crackles right base.   CV: Heart regular S1/S2, no S3/S4, no murmur. Port present in R. upper chest. Abdomen: Soft, nontender Neurologic: Alert and oriented x 3.  Psych: Normal affect. Extremities: No edema  LABS: Basic Metabolic Panel:  Recent Labs  13/08/65 0430 07/26/13 0607  NA 138 138  K 4.5 4.2  CL 97 100  CO2 32 29  GLUCOSE 97 93  BUN 61* 53*  CREATININE 2.69* 2.02*  CALCIUM 8.8 8.7   CBC:  Recent Labs  07/26/13 0607  WBC 5.7  HGB 11.4*  HCT 35.2*  MCV 90.7  PLT 191   Cardiac Enzymes:  Recent Labs  07/23/13 0821  TROPONINI <0.30   RADIOLOGY: Dg Chest 2 View  07/22/2013   *RADIOLOGY REPORT*  Clinical Data: Chest pain and shortness of breath  CHEST - 2 VIEW  Comparison: 05/29/2013  Findings: Status post CABG.  Interval placement of right Port-A- Cath with tip  to the cavoatrial junction.  There is moderate interstitial infiltrate in the right perihilar area.  There is also mild interstitial change in the left perihilar area.  There is mild vascular congestion.  IMPRESSION: Although right middle lobe pneumonia is not excluded, the findings suggest developing interstitial pulmonary edema.  Radiographic follow-up recommended.  The   Original Report Authenticated By: Esperanza Heir, M.D.   ECHO: 06/05/13 Study Conclusions: - Left ventricle: This study is severely limited. Other sudies would be needed to be sure about LV function/EF. The septum moves. However there is suggestion of significant overall LV dysfunction. EF can not be estimated. The cavity size was normal. Wall thickness was normal. Images were inadequate for LV wall motion assessment. - Aortic valve: Sclerosis without stenosis. - Left atrium: The atrium was mildly dilated. - Right ventricle: Not able to assess RV function due to poor visualization. Not able to assess RV size due to poor visualization.  03/27/13: Study Conclusions: - Left ventricle: The cavity size was normal. Wall thickness was increased in a pattern of mild LVH. Systolic function was mildly reduced. The estimated ejection fraction was in the range of 45% to 50%. Wall motion was normal;  there were no regional wall motion abnormalities. - Mitral valve: Mild regurgitation. - Atrial septum: No defect or patent foramen ovale was identified.  TELEMETRY: Rate is regular. Pt in possible atypical flutter, rate in 90's - 100's.  ASSESSMENT AND PLAN:  79 y.o. y/o male w/ PMHx of CAD, chronic systolic/diastolic CHF, A-fib, PVD, HTN, HLD, CKD, and a recent history of a left-sided nasal lymphoma and bladder mass, admitted on 07/22/13 w/ acute on chronic systolic CHF.  1. Acute CHF:  Echo difficult to interpret even with Definity.  Possibly low normal to normal EF.  Patient looks euvolemic this morning on exam.  Renal function  improving with holding diuretics - Change coreg to 6.25 BID - Continue to hold enalapril.  2. Rhythm: Patient appears to be in an atypical atrial flutter.  Rate is controlled.  Continue warfarin and low dose Coreg.  3. AKI on CKD: Renal function improving. Patient would like to be DCed; will DC O2 and ambulate; if sats stable, DC today and treat with lasix 20 mg daily beginning in AM; check BMET Monday. FU with Dr Antoine Poche or PA in 1 week; resume ACEI at that time if BP and renal function allows. Increase coreg later as tolerated by pulse and BP. >30 min PA and physician time D2  Olga Millers 07/26/2013 8:04 AM

## 2013-07-26 NOTE — Progress Notes (Signed)
ANTICOAGULATION CONSULT NOTE - Follow Up Consult  Pharmacy Consult for warfarin Indication: atrial fibrillation  No Known Allergies  Patient Measurements: Height: 6' (182.9 cm) Weight: 164 lb 3.9 oz (74.5 kg) IBW/kg (Calculated) : 77.6   Vital Signs: Temp: 97.7 F (36.5 C) (08/01 0647) Temp src: Oral (08/01 0647) BP: 107/61 mmHg (08/01 0647) Pulse Rate: 97 (08/01 0647)  Labs:  Recent Labs  07/24/13 0516 07/25/13 0430 07/26/13 0607  HGB  --   --  11.4*  HCT  --   --  35.2*  PLT  --   --  191  LABPROT 23.2* 28.0* 25.5*  INR 2.14* 2.73* 2.42*  CREATININE 2.56* 2.69* 2.02*    Estimated Creatinine Clearance: 29.7 ml/min (by C-G formula based on Cr of 2.02).   Medications:  Scheduled:  . carvedilol  6.25 mg Oral BID WC  . digoxin  0.125 mg Oral QPM  . docusate sodium  100 mg Oral BID  . ezetimibe-simvastatin  1 tablet Oral QHS  . multivitamin with minerals  1 tablet Oral Daily  . sodium chloride  3 mL Intravenous Q12H  . tamsulosin  0.4 mg Oral Daily  . Warfarin - Pharmacist Dosing Inpatient   Does not apply q1800    Assessment: 81 YOM who continues on warfarin for Afib. INR was low on admission as patient had recently restarted warfarin which had been held for a procedure. INR increased nicely and has now been therapeutic x3 days. This morning's INR 2.42. CBC is stable from admit and no bleeding noted.  Goal of Therapy:  INR 2-3 Monitor platelets by anticoagulation protocol: Yes   Plan:  1. Warfarin 5mg  po x1 tonight 2. Daily PT/INR while patient admitted 3. Recommend patient to continue on home dose of warfarin 5mg  daily except 2.5 on Tuesdays and Thursdays with an INR check on 8/4 or 8/5 if discharged today 4. Follow s/s bleeding  Conan Mcmanaway D. Andree Heeg, PharmD Clinical Pharmacist Pager: [phone] 07/26/2013 11:49 AM

## 2013-07-26 NOTE — Discharge Summary (Signed)
Patient ID: Gabriel Estrada,  MRN: [id number], DOB/AGE: 77-Mar-1929 77 y.o.  Admit date: 07/22/2013 Discharge date: 07/26/2013  Primary Cardiologist: J. Hochrein, MD  Discharge Diagnoses Principal Problem:   Acute on chronic systolic CHF (congestive heart failure)  **Neg negative diuresis of  4.3 Liters with reduction in weight from 174 lbs on admission to 164 lbs at discharge.  **ACE Inhibitor is currently on hold 2/2 acute renal failure in setting of diuresis this admission.  Active Problems:   Acute on chronic renal failure   CAD   Atrial fibrillation   DYSLIPIDEMIA   CAROTID ARTERY DISEASE   Mixed hyperlipidemia   Long term current use of anticoagulant   HTN  **Relative hypotension throughout admission.  Allergies No Known Allergies  Procedures  2D Echocardiogram 7.30.2014  Impressions:  - Limited study with poor windows.   Even with definity endocardial definition is poor   ? septal and apical hypokinesis   EF llikely low normal to normal   Mild LVH     AV calcified without stenosis   Trivial MR _____________   History of Present Illness  79 y/o male with history of CAD and chronic systolic congestive heart failure. 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 Despite having come off of Coumadin for surgery in early July, he has otherwise been taking all his medicines as prescribed. He has not experienced any change in his diet or appetite. Over a3-4 day period prior to admission, he noted worsening exertional dyspnea and also mild right greater than left low chimney edema. He did not think that his weight changed.  After discussing Ss with his sisters on 7/28, he was taken to the ED where he was found to have pulmonary edema on CXR with a proBNP of >11K.  He was admitted to cardiology for further evaluation and diuresis.   Hospital Course  Following admission  he was aggressively diuresed with reduction in weight from 174 lbs on admission to 161 lbs on 7/31.  With this rapid diuresis however, pt developed acute on chronic renal insufficiency with a rise in creatinine to a peak of 2.69 on 7/31, resulting in discontinuation of lasix and ACEI therapy as well as reduction in beta blocker dosing.  He was gently hydrated over a 2 day period and renal function has stabilized and is trending back down (creatinine 2.02 today).  Throughout this admission, patient has remained in an atypical atrial flutter with rates hovering just around 100 bpm.  This has been stable despite reduction in beta blocker dosage.  Today, while working with Physical Therapy and climbing stairs, he did become tachycardic with rates briefly reaching 180 bpm.  He remained asymptomatic and rate returned to baseline with cessation of activity.  Patient wishes to be discharged and thus we will plan to titrate his beta blocker further as an outpatient as BP allows.  Patient will be discharged today.  He will resume low dose lasix tomorrow.  We have arranged for outpatient bmet on 8/4 with f/u office visit and coumadin clinic on 8/8.  Provided that bmet is stable, we will look to resume his enalapril and later look to titrate beta blocker therapy.  Discharge Vitals Blood pressure 105/60, pulse 100, temperature 97.8 F (36.6 C), temperature source Oral, resp. rate 20, height 6' (1.829 m), weight 164 lb 3.9 oz (74.5 kg), SpO2 99.00%.  Filed Weights  07/25/13 0500 07/25/13 0610 07/26/13 0647  Weight: 161 lb 12.8 oz (73.392 kg) 161 lb 12.8 oz (73.392 kg) 164 lb 3.9 oz (74.5 kg)   Labs  CBC  Recent Labs  07/26/13 0607  WBC 5.7  HGB 11.4*  HCT 35.2*  MCV 90.7  PLT 191   Basic Metabolic Panel  Recent Labs  07/25/13 0430 07/26/13 0607  NA 138 138  K 4.5 4.2  CL 97 100  CO2 32 29  GLUCOSE 97 93  BUN 61* 53*  CREATININE 2.69* 2.02*  CALCIUM 8.8 8.7   Liver Function Tests Lab  Results  Component Value Date   ALT 37 07/22/2013   AST 32 07/22/2013   ALKPHOS 69 07/22/2013   BILITOT 1.1 07/22/2013   Cardiac Enzymes Lab Results  Component Value Date   TROPONINI <0.30 07/23/2013   Thyroid Function Tests Lab Results  Component Value Date   TSH 2.815 07/22/2013   Disposition  Pt is being discharged home today in good condition.  Follow-up Plans & Appointments  Follow-up Information   Follow up with Rick Duff, PA-C On 08/02/2013. (2:00 PM - Dr. Jenene Slicker PA)    Contact information:   [address] [place] [address] [phone]       Follow up with [facility] On 08/02/2013. (1:45 PM)    Contact information:   [address] [place] [address] [phone]      Follow up with [facility] On 07/29/2013. (anytime between 8:00 and 12:00.)    Contact information:   [address] [place] [address] [phone]     Discharge Medications    Medication List    STOP taking these medications       enalapril 10 MG tablet  Commonly known as:  VASOTEC     oxyCODONE-acetaminophen 5-325 MG per tablet  Commonly known as:  PERCOCET     prochlorperazine 10 MG tablet  Commonly known as:  COMPAZINE      TAKE these medications       carvedilol 6.25 MG tablet  Commonly known as:  COREG  Take 1 tablet (6.25 mg total) by mouth 2 (two) times daily with a meal.     digoxin 0.125 MG tablet  Commonly known as:  LANOXIN  Take 0.125 mg by mouth every evening.     docusate sodium 100 MG capsule  Commonly known as:  COLACE  Take 1 capsule (100 mg total) by mouth 2 (two) times daily.     ezetimibe-simvastatin 10-40 MG per tablet  Commonly known as:  VYTORIN  Take 1 tablet by mouth at bedtime.     furosemide 20 MG tablet  Commonly known as:  LASIX  Take 1 tablet (20 mg total) by mouth daily.     HYDROcodone-acetaminophen 5-325 MG per tablet  Commonly known as:  NORCO/VICODIN  Take 1-2  tablets by mouth every 6 (six) hours as needed for pain.     lidocaine-prilocaine cream  Commonly known as:  EMLA  Apply topically as needed.     multivitamin with minerals Tabs  Take 1 tablet by mouth daily.     ondansetron 8 MG tablet  Commonly known as:  ZOFRAN  Take 1 tablet (8 mg total) by mouth every 8 (eight) hours as needed for nausea.     phenazopyridine 100 MG tablet  Commonly known as:  PYRIDIUM  Take 1 tablet (100 mg total) by mouth 3 (three) times  daily as needed for pain (for burning).     tamsulosin 0.4 MG Caps  Commonly known as:  FLOMAX  Take 0.4 mg by mouth 1 day or 1 dose. bedtime     warfarin 5 MG tablet  Commonly known as:  COUMADIN  Take 2.5-5 mg by mouth daily. Take 5mg  daily except 2.5mg  on Tuesdays and Thursdays       Outstanding Labs/Studies  BMET on Monday 8/4, INR on 8/8  Duration of Discharge Encounter   Greater than 30 minutes including physician time.  SignedNicolasa Ducking NP 07/26/2013, 3:08 PM

## 2013-07-26 NOTE — Discharge Summary (Signed)
See progress notes Brian Crenshaw  

## 2013-07-26 NOTE — Addendum Note (Signed)
Encounter addended by: Albertus Chiarelli Mintz Gerod Caligiuri, RN on: 07/26/2013  4:56 PM<BR>     Documentation filed: Charges VN

## 2013-07-26 NOTE — Telephone Encounter (Signed)
New problem   Pt having 7 day TCM 08/02/13 per Thayer Ohm PA calling.

## 2013-07-27 LAB — URINE CULTURE

## 2013-07-29 ENCOUNTER — Other Ambulatory Visit: Payer: Medicare Other

## 2013-07-29 ENCOUNTER — Other Ambulatory Visit (INDEPENDENT_AMBULATORY_CARE_PROVIDER_SITE_OTHER): Payer: Medicare Other

## 2013-07-29 DIAGNOSIS — I509 Heart failure, unspecified: Secondary | ICD-10-CM

## 2013-07-29 DIAGNOSIS — I5021 Acute systolic (congestive) heart failure: Secondary | ICD-10-CM

## 2013-07-29 LAB — BASIC METABOLIC PANEL
BUN: 46 mg/dL — ABNORMAL HIGH (ref 6–23)
CO2: 28 mEq/L (ref 19–32)
Chloride: 101 mEq/L (ref 96–112)
Glucose, Bld: 109 mg/dL — ABNORMAL HIGH (ref 70–99)
Potassium: 4.8 mEq/L (ref 3.5–5.1)
Sodium: 140 mEq/L (ref 135–145)

## 2013-07-29 NOTE — Telephone Encounter (Signed)
Patient contacted regarding discharge from [facility] on 07/26/13.  Patient understands to follow up with provider Rick Duff, PA on 08/02/13  at [facility]. Patient understands discharge instructions? Pt's sister, Janet Berlin, states yes. Patient understands medications and regiment? Pt's sister, Janet Berlin, states yes.  Patient understands to bring all medications to this visit? Pt's sister,Marie Morrison Old, states yes.  I spoke with patient's sister Janet Berlin. She states she lives with the patient and everything seems OK.

## 2013-08-02 ENCOUNTER — Ambulatory Visit (INDEPENDENT_AMBULATORY_CARE_PROVIDER_SITE_OTHER): Payer: Medicare Other | Admitting: Cardiology

## 2013-08-02 ENCOUNTER — Encounter: Payer: Self-pay | Admitting: Cardiology

## 2013-08-02 ENCOUNTER — Other Ambulatory Visit: Payer: Medicare Other

## 2013-08-02 ENCOUNTER — Ambulatory Visit (INDEPENDENT_AMBULATORY_CARE_PROVIDER_SITE_OTHER): Payer: Medicare Other | Admitting: *Deleted

## 2013-08-02 VITALS — BP 110/70 | HR 96 | Ht 72.0 in | Wt 164.0 lb

## 2013-08-02 DIAGNOSIS — N183 Chronic kidney disease, stage 3 unspecified: Secondary | ICD-10-CM

## 2013-08-02 DIAGNOSIS — I4891 Unspecified atrial fibrillation: Secondary | ICD-10-CM

## 2013-08-02 DIAGNOSIS — I5022 Chronic systolic (congestive) heart failure: Secondary | ICD-10-CM

## 2013-08-02 DIAGNOSIS — Z7901 Long term (current) use of anticoagulants: Secondary | ICD-10-CM

## 2013-08-02 DIAGNOSIS — I4892 Unspecified atrial flutter: Secondary | ICD-10-CM

## 2013-08-02 NOTE — Addendum Note (Signed)
Addended by: Tonita Phoenix on: 08/02/2013 04:22 PM   Modules accepted: Orders

## 2013-08-02 NOTE — Addendum Note (Signed)
Addended by: Frantz Quattrone K on: 08/02/2013 04:21 PM   Modules accepted: Orders  

## 2013-08-02 NOTE — Addendum Note (Signed)
Addended by: Tonita Phoenix on: 08/02/2013 04:21 PM   Modules accepted: Orders

## 2013-08-02 NOTE — Patient Instructions (Signed)
Your physician recommends that you return for lab work in: bmet  Your physician recommends that you continue on your current medications as directed. Please refer to the Current Medication list given to you today.  Your physician recommends that you schedule a follow-up appointment in: keep appointment with Dr. Antoine Poche

## 2013-08-02 NOTE — Progress Notes (Signed)
CARDIOLOGY OFFICE NOTE  Patient ID: Gabriel Estrada MRN: [id number], DOB/AGE: March 30, 1928   Date of Visit: 08/02/2013  Primary Cardiologist: [name], MD Reason for Visit: Hospital follow-up  History of Present Illness  Gabriel Estrada is a 79 y.o. male with CAD (02/2001; MI/Cath/CABG x 5: LIMA->LAD, VG->D1, VG->OM2, VG->PDA->LPL & MV Ring), chronic systolic CHF, A-fib, PVD, HTN, HLD and CKD who was recently admitted to [facility] on 07/22/2013 with decompensated HF. He was aggressively diuresed with worsening renal function. Lasix and ACEI were discontinued. He was gently hydrated. He also had atypical atrial flutter and his BB dose was adjusted. He was discharged on 07/26/2013. He presents today for hospital follow-up.   Since discharge, he reports he is doing well and has no complaints. He denies chest pain or shortness of breath. He denies palpitations, dizziness, near syncope or syncope. He denies LE swelling, orthopnea, PND or recent weight gain. He is compliant and tolerating medications without difficulty.  Past Medical History Past Medical History  Diagnosis Date  . Coronary artery disease     a. 02/2001 MI/Cath/CABG x 5: LIMA->LAD, VG->D1, VG->OM2, VG->PDA->LPL & MV Ring (#30 Seguin ring annuloplasty)  . PVD (peripheral vascular disease)   . Hyperlipidemia   . Mitral regurgitation     a. 02/2001 s/p #30 Seguin ring annuloplasty.  . Chronic systolic CHF (congestive heart failure)     a. 05/2013 Echo: appears to be signif overall LV dysfxn, cannot estimate EF.  Ao sclerosis;  b. 06/2013 Echo: Poor windows, ? septal and apical HK, mild LVH, EF likely low normal  . Hypertension   . CKD (chronic kidney disease), stage III   . Teeth missing     all teeth pulled-06-13-13  . A-fib     a. chronic-Dr. Hochrein,cardiology;  b. on coumadin.  [name] Lymphoma     Nasal Mass-left-Dr. Ha,oncology  . Bladder mass 07-03-13  . Carotid artery disease     a. 09/2012 U/S: 0-39% bilat ICA stenosis.    Past  Surgical History Past Surgical History  Procedure Laterality Date  . Coronary artery bypass graft   03/15/01    x 5  . Multiple extractions with alveoloplasty N/A 05/30/2013  . Soft tissue biopsy       Diffuse B-Cell Lynphoma/ Nasal mucosa, biopsy, left,left nasal mass  . Portacath placement Right     right chest  . Cystoscopy w/ retrogrades Bilateral 07/08/2013    Allergies/Intolerances No Known Allergies  Current Home Medications Current Outpatient Prescriptions  Medication Sig Dispense Refill  . digoxin (LANOXIN) 0.125 MG tablet Take 0.125 mg by mouth every evening.       . docusate sodium (COLACE) 100 MG capsule Take 1 capsule (100 mg total) by mouth 2 (two) times daily.  30 capsule  0  . ezetimibe-simvastatin (VYTORIN) 10-40 MG per tablet Take 1 tablet by mouth at bedtime.      . furosemide (LASIX) 20 MG tablet Take 1 tablet (20 mg total) by mouth daily.  30 tablet  6  . HYDROcodone-acetaminophen (NORCO/VICODIN) 5-325 MG per tablet Take 1-2 tablets by mouth every 6 (six) hours as needed for pain.  25 tablet  0  . lidocaine-prilocaine (EMLA) cream Apply topically as needed.  30 g  2  . Multiple Vitamin (MULTIVITAMIN WITH MINERALS) TABS Take 1 tablet by mouth daily.      . ondansetron (ZOFRAN) 8 MG tablet Take 1 tablet (8 mg total) by mouth every 8 (eight) hours as needed for nausea.  20 tablet  0  . phenazopyridine (PYRIDIUM) 100 MG tablet Take 1 tablet (100 mg total) by mouth 3 (three) times daily as needed for pain (for burning).  20 tablet  0  . tamsulosin (FLOMAX) 0.4 MG CAPS Take 0.4 mg by mouth 1 day or 1 dose. bedtime      . warfarin (COUMADIN) 5 MG tablet Take 2.5-5 mg by mouth daily. Take 5mg  daily except 2.5mg  on Tuesdays and Thursdays      . carvedilol (COREG) 6.25 MG tablet Take 1 tablet (6.25 mg total) by mouth 2 (two) times daily with a meal.  30 tablet  6   No current facility-administered medications for this visit.   Social History Social History  . Marital Status:  Widowed    Spouse Name: N/A    Number of Children: 2  . Years of Education: N/A   Occupational History  . retired     Production designer, theatre/television/film    Social History Main Topics  . Smoking status: Former Smoker -- 1.00 packs/day for 20 years    Quit date: 12/26/2010  . Smokeless tobacco: Never Used  . Alcohol Use: No     Comment: Used to drink beer, but not anymore.  . Drug Use: No   Social History Narrative   Lives in [place] with sister.  Fairly sedentary.    Review of Systems General: No chills, fever, night sweats or weight changes Cardiovascular: No chest pain, dyspnea on exertion, edema, orthopnea, palpitations, paroxysmal nocturnal dyspnea Dermatological: No rash, lesions or masses Respiratory: No cough, dyspnea Urologic: No hematuria, dysuria Abdominal: No nausea, vomiting, diarrhea, bright red blood per rectum, melena, or hematemesis Neurologic: No visual changes, weakness, changes in mental status All other systems reviewed and are otherwise negative except as noted above.  Physical Exam Vitals: Blood pressure 110/70, pulse 96, height 6' (1.829 m), weight 164 lb (74.39 kg).  General: Well developed, elderly 79 y.o. male in no acute distress. HEENT: Normocephalic, atraumatic. EOMs intact. Sclera nonicteric. Oropharynx clear.  Neck: Supple. No JVD. Lungs: Respirations regular and unlabored, CTA bilaterally. No wheezes, rales or rhonchi. Heart: RRR. S1, S2 present. No murmurs, rub, S3 or S4. Abdomen: Soft, non-distended.   Extremities: No clubbing, cyanosis or edema. PT/Radials 2+ and equal bilaterally. Psych: Normal affect. Neuro: Alert and oriented X 3. Moves all extremities spontaneously.   Diagnostics/Labs BMET    Component Value Date/Time   NA 140 07/29/2013 0852   NA 139 05/29/2013 1328   K 4.8 07/29/2013 0852   K 5.1 05/29/2013 1328   CL 101 07/29/2013 0852   CL 104 05/29/2013 1328   CO2 28 07/29/2013 0852   CO2 25 05/29/2013 1328   GLUCOSE 109* 07/29/2013 0852   GLUCOSE 98  05/29/2013 1328   BUN 46* 07/29/2013 0852   BUN 44.2* 05/29/2013 1328   CREATININE 2.2* 07/29/2013 0852   CREATININE 1.9* 05/29/2013 1328   CALCIUM 9.3 07/29/2013 0852   CALCIUM 10.0 05/29/2013 1328    Assessment and Plan 1. Chronic systolic HF  - stable; SOB and LE edema resolved without recurrence  - continue medical therapy  - follow-up with Dr. Antoine Poche in 4-6 weeks 2. Atypical atrial flutter  - rate controlled  - continue BB and warfarin 3. CKD  - will repeat BMET today and adjust diuretic accordingly if needed  Signed, Rick Duff, PA-C 08/02/2013, 3:14 PM

## 2013-08-03 LAB — BASIC METABOLIC PANEL
BUN: 40 mg/dL — ABNORMAL HIGH (ref 6–23)
CO2: 29 mEq/L (ref 19–32)
Chloride: 100 mEq/L (ref 96–112)
Glucose, Bld: 86 mg/dL (ref 70–99)
Potassium: 4.5 mEq/L (ref 3.5–5.3)

## 2013-08-14 ENCOUNTER — Other Ambulatory Visit: Payer: Self-pay | Admitting: *Deleted

## 2013-08-14 DIAGNOSIS — N179 Acute kidney failure, unspecified: Secondary | ICD-10-CM

## 2013-08-14 DIAGNOSIS — C8589 Other specified types of non-Hodgkin lymphoma, extranodal and solid organ sites: Secondary | ICD-10-CM

## 2013-08-14 DIAGNOSIS — C8330 Diffuse large B-cell lymphoma, unspecified site: Secondary | ICD-10-CM

## 2013-08-14 NOTE — Progress Notes (Signed)
Previous appt cancelled due to patient was in hospital, need to r/s from 07/25/13 to discuss treatment plan.

## 2013-08-18 ENCOUNTER — Telehealth: Payer: Self-pay | Admitting: Hematology and Oncology

## 2013-08-18 NOTE — Telephone Encounter (Signed)
S/w pt re appt for 8/27 @ 11:30am.

## 2013-08-21 ENCOUNTER — Other Ambulatory Visit (HOSPITAL_BASED_OUTPATIENT_CLINIC_OR_DEPARTMENT_OTHER): Payer: Medicare Other | Admitting: Lab

## 2013-08-21 ENCOUNTER — Ambulatory Visit (HOSPITAL_BASED_OUTPATIENT_CLINIC_OR_DEPARTMENT_OTHER): Payer: Medicare Other | Admitting: Hematology and Oncology

## 2013-08-21 VITALS — BP 118/59 | HR 68 | Ht 72.0 in | Wt 160.8 lb

## 2013-08-21 DIAGNOSIS — I509 Heart failure, unspecified: Secondary | ICD-10-CM

## 2013-08-21 DIAGNOSIS — C8589 Other specified types of non-Hodgkin lymphoma, extranodal and solid organ sites: Secondary | ICD-10-CM

## 2013-08-21 DIAGNOSIS — N189 Chronic kidney disease, unspecified: Secondary | ICD-10-CM

## 2013-08-21 DIAGNOSIS — C8330 Diffuse large B-cell lymphoma, unspecified site: Secondary | ICD-10-CM

## 2013-08-21 DIAGNOSIS — N179 Acute kidney failure, unspecified: Secondary | ICD-10-CM

## 2013-08-21 DIAGNOSIS — Z859 Personal history of malignant neoplasm, unspecified: Secondary | ICD-10-CM

## 2013-08-21 LAB — CBC WITH DIFFERENTIAL/PLATELET
Basophils Absolute: 0 10*3/uL (ref 0.0–0.1)
Eosinophils Absolute: 0.2 10*3/uL (ref 0.0–0.5)
HCT: 39.2 % (ref 38.4–49.9)
HGB: 12.5 g/dL — ABNORMAL LOW (ref 13.0–17.1)
LYMPH%: 15.7 % (ref 14.0–49.0)
MCV: 89.9 fL (ref 79.3–98.0)
MONO%: 8 % (ref 0.0–14.0)
NEUT#: 4.7 10*3/uL (ref 1.5–6.5)
NEUT%: 73.3 % (ref 39.0–75.0)
Platelets: 171 10*3/uL (ref 140–400)
RDW: 14.1 % (ref 11.0–14.6)

## 2013-08-21 LAB — COMPREHENSIVE METABOLIC PANEL (CC13)
ALT: 21 U/L (ref 0–55)
AST: 26 U/L (ref 5–34)
Calcium: 9.5 mg/dL (ref 8.4–10.4)
Creatinine: 1.7 mg/dL — ABNORMAL HIGH (ref 0.7–1.3)
Glucose: 91 mg/dl (ref 70–140)
Potassium: 4.2 mEq/L (ref 3.5–5.1)
Total Bilirubin: 0.85 mg/dL (ref 0.20–1.20)
Total Protein: 7.1 g/dL (ref 6.4–8.3)

## 2013-08-22 ENCOUNTER — Telehealth: Payer: Self-pay | Admitting: *Deleted

## 2013-08-22 ENCOUNTER — Telehealth: Payer: Self-pay | Admitting: Hematology and Oncology

## 2013-08-22 NOTE — Telephone Encounter (Signed)
Sister aware that treatment will take almost all day.

## 2013-08-22 NOTE — Telephone Encounter (Signed)
s.w. pt wife and advised on all nxt week appts...ok and aware...will get updated sched at nxt visit

## 2013-08-22 NOTE — Telephone Encounter (Signed)
Per staff message and POF I have scheduled appts.  Advised scheduler that labs need to be moved JMW

## 2013-08-27 ENCOUNTER — Other Ambulatory Visit: Payer: Medicare Other

## 2013-08-29 ENCOUNTER — Telehealth: Payer: Self-pay | Admitting: *Deleted

## 2013-08-29 NOTE — Telephone Encounter (Signed)
Returned pt's call with confirmation of tomorrow's appointments.  Young Berry, RN, BSN, [facility] Navigator [phone]

## 2013-08-30 ENCOUNTER — Ambulatory Visit (HOSPITAL_BASED_OUTPATIENT_CLINIC_OR_DEPARTMENT_OTHER): Payer: Medicare Other | Admitting: Hematology and Oncology

## 2013-08-30 ENCOUNTER — Encounter: Payer: Self-pay | Admitting: *Deleted

## 2013-08-30 ENCOUNTER — Other Ambulatory Visit (HOSPITAL_BASED_OUTPATIENT_CLINIC_OR_DEPARTMENT_OTHER): Payer: Medicare Other | Admitting: Lab

## 2013-08-30 ENCOUNTER — Ambulatory Visit (HOSPITAL_BASED_OUTPATIENT_CLINIC_OR_DEPARTMENT_OTHER): Payer: Medicare Other

## 2013-08-30 VITALS — BP 113/59 | HR 70 | Resp 19 | Ht 72.0 in | Wt 162.9 lb

## 2013-08-30 VITALS — BP 128/74 | HR 69 | Temp 97.1°F | Resp 20

## 2013-08-30 DIAGNOSIS — C8589 Other specified types of non-Hodgkin lymphoma, extranodal and solid organ sites: Secondary | ICD-10-CM

## 2013-08-30 DIAGNOSIS — C8330 Diffuse large B-cell lymphoma, unspecified site: Secondary | ICD-10-CM

## 2013-08-30 DIAGNOSIS — I509 Heart failure, unspecified: Secondary | ICD-10-CM

## 2013-08-30 DIAGNOSIS — Z5112 Encounter for antineoplastic immunotherapy: Secondary | ICD-10-CM

## 2013-08-30 LAB — CBC WITH DIFFERENTIAL/PLATELET
BASO%: 0.6 % (ref 0.0–2.0)
EOS%: 1.7 % (ref 0.0–7.0)
HCT: 38.6 % (ref 38.4–49.9)
LYMPH%: 13.1 % — ABNORMAL LOW (ref 14.0–49.0)
MCH: 28.8 pg (ref 27.2–33.4)
MCHC: 32.6 g/dL (ref 32.0–36.0)
MONO#: 0.5 10*3/uL (ref 0.1–0.9)
NEUT%: 77.6 % — ABNORMAL HIGH (ref 39.0–75.0)
Platelets: 153 10*3/uL (ref 140–400)
RBC: 4.38 10*6/uL (ref 4.20–5.82)
WBC: 6.6 10*3/uL (ref 4.0–10.3)
nRBC: 0 % (ref 0–0)

## 2013-08-30 LAB — COMPREHENSIVE METABOLIC PANEL (CC13)
ALT: 23 U/L (ref 0–55)
AST: 29 U/L (ref 5–34)
Alkaline Phosphatase: 80 U/L (ref 40–150)
BUN: 30.5 mg/dL — ABNORMAL HIGH (ref 7.0–26.0)
Creatinine: 1.5 mg/dL — ABNORMAL HIGH (ref 0.7–1.3)
Total Bilirubin: 0.77 mg/dL (ref 0.20–1.20)

## 2013-08-30 MED ORDER — DIPHENHYDRAMINE HCL 25 MG PO CAPS
50.0000 mg | ORAL_CAPSULE | Freq: Once | ORAL | Status: AC
  Administered 2013-08-30: 50 mg via ORAL

## 2013-08-30 MED ORDER — LORAZEPAM 0.5 MG PO TABS: 0.5000 mg | ORAL_TABLET | Freq: Four times a day (QID) | ORAL | Status: DC | PRN

## 2013-08-30 MED ORDER — ETOPOSIDE 50 MG PO CAPS: 100.0000 mg/m2/d | ORAL_CAPSULE | Freq: Every day | ORAL | Status: DC

## 2013-08-30 MED ORDER — ACETAMINOPHEN 325 MG PO TABS
650.0000 mg | ORAL_TABLET | Freq: Once | ORAL | Status: AC
  Administered 2013-08-30: 650 mg via ORAL

## 2013-08-30 MED ORDER — SODIUM CHLORIDE 0.9 % IV SOLN
750.0000 mg/m2 | Freq: Once | INTRAVENOUS | Status: AC
  Administered 2013-08-30: 1460 mg via INTRAVENOUS
  Filled 2013-08-30: qty 73

## 2013-08-30 MED ORDER — SODIUM CHLORIDE 0.9 % IV SOLN: 375.0000 mg/m2 | Freq: Once | INTRAVENOUS | Status: DC

## 2013-08-30 MED ORDER — HEPARIN SOD (PORK) LOCK FLUSH 100 UNIT/ML IV SOLN
500.0000 [IU] | Freq: Once | INTRAVENOUS | Status: AC | PRN
  Administered 2013-08-30: 500 [IU]
  Filled 2013-08-30: qty 5

## 2013-08-30 MED ORDER — SODIUM CHLORIDE 0.9 % IV SOLN
700.0000 mg | Freq: Once | INTRAVENOUS | Status: AC
  Administered 2013-08-30: 700 mg via INTRAVENOUS
  Filled 2013-08-30: qty 70

## 2013-08-30 MED ORDER — ACETAMINOPHEN 325 MG PO TABS
ORAL_TABLET | ORAL | Status: AC
  Filled 2013-08-30: qty 2

## 2013-08-30 MED ORDER — ONDANSETRON 16 MG/50ML IVPB (CHCC)
INTRAVENOUS | Status: AC
  Filled 2013-08-30: qty 16

## 2013-08-30 MED ORDER — SODIUM CHLORIDE 0.9 % IJ SOLN
10.0000 mL | INTRAMUSCULAR | Status: DC | PRN
  Administered 2013-08-30: 10 mL
  Filled 2013-08-30: qty 10

## 2013-08-30 MED ORDER — DIPHENHYDRAMINE HCL 25 MG PO CAPS
ORAL_CAPSULE | ORAL | Status: AC
  Filled 2013-08-30: qty 2

## 2013-08-30 MED ORDER — SODIUM CHLORIDE 0.9 % IV SOLN
Freq: Once | INTRAVENOUS | Status: AC
  Administered 2013-08-30: 11:00:00 via INTRAVENOUS

## 2013-08-30 MED ORDER — ALLOPURINOL 300 MG PO TABS: 300.0000 mg | ORAL_TABLET | Freq: Every day | ORAL | Status: DC

## 2013-08-30 MED ORDER — ONDANSETRON 16 MG/50ML IVPB (CHCC)
16.0000 mg | Freq: Once | INTRAVENOUS | Status: AC
  Administered 2013-08-30: 16 mg via INTRAVENOUS

## 2013-08-30 MED ORDER — PROCHLORPERAZINE MALEATE 10 MG PO TABS: 10.0000 mg | ORAL_TABLET | Freq: Four times a day (QID) | ORAL | Status: DC | PRN

## 2013-08-30 MED ORDER — DEXAMETHASONE SODIUM PHOSPHATE 20 MG/5ML IJ SOLN
INTRAMUSCULAR | Status: AC
  Filled 2013-08-30: qty 5

## 2013-08-30 MED ORDER — PREDNISONE 20 MG PO TABS: 40.0000 mg/m2 | ORAL_TABLET | Freq: Every day | ORAL | Status: DC

## 2013-08-30 MED ORDER — DEXAMETHASONE SODIUM PHOSPHATE 20 MG/5ML IJ SOLN
20.0000 mg | Freq: Once | INTRAMUSCULAR | Status: AC
  Administered 2013-08-30: 20 mg via INTRAVENOUS

## 2013-08-30 MED ORDER — SODIUM CHLORIDE 0.9 % IV SOLN
50.0000 mg/m2 | Freq: Once | INTRAVENOUS | Status: AC
  Administered 2013-08-30: 100 mg via INTRAVENOUS
  Filled 2013-08-30: qty 5

## 2013-08-30 MED ORDER — ONDANSETRON HCL 8 MG PO TABS: 8.0000 mg | ORAL_TABLET | Freq: Two times a day (BID) | ORAL | Status: DC

## 2013-08-30 MED ORDER — VINCRISTINE SULFATE CHEMO INJECTION 1 MG/ML
2.0000 mg | Freq: Once | INTRAVENOUS | Status: AC
  Administered 2013-08-30: 2 mg via INTRAVENOUS
  Filled 2013-08-30: qty 2

## 2013-08-30 NOTE — Patient Instructions (Addendum)
[facility] Discharge Instructions for Patients Receiving Chemotherapy  Today you received the following chemotherapy agents :  Vincristine, Cytoxan, Etoposide, Rituxan.  To help prevent nausea and vomiting after your treatment, we encourage you to take your nausea medication as instructed by your physician.  Take Prednisone as instructed by your doctor.  Take Zofran 8 mg by mouth twice daily for 3 days after chemo - starting 08/31/13; and as needed for nausea.  Can also take Compazine 10 mg every 6 hrs as needed for nausea.  Can also take Ativan 0.5 mg every 6 hrs as needed for nausea.  DO NOT Drive after taking Compazine nor Ativan as they can cause drowsiness.  DO NOT Eat greasy nor spicy foods.  DO Drink lots of fluids as tolerated.   If you develop nausea and vomiting that is not controlled by your nausea medication, call the clinic.   BELOW ARE SYMPTOMS THAT SHOULD BE REPORTED IMMEDIATELY:  *FEVER GREATER THAN 100.5 F  *CHILLS WITH OR WITHOUT FEVER  NAUSEA AND VOMITING THAT IS NOT CONTROLLED WITH YOUR NAUSEA MEDICATION  *UNUSUAL SHORTNESS OF BREATH  *UNUSUAL BRUISING OR BLEEDING  TENDERNESS IN MOUTH AND THROAT WITH OR WITHOUT PRESENCE OF ULCERS  *URINARY PROBLEMS  *BOWEL PROBLEMS  UNUSUAL RASH Items with * indicate a potential emergency and should be followed up as soon as possible.  Feel free to call the clinic you have any questions or concerns. The clinic phone number is [phone].

## 2013-09-02 ENCOUNTER — Telehealth: Payer: Self-pay | Admitting: Hematology and Oncology

## 2013-09-02 ENCOUNTER — Other Ambulatory Visit: Payer: Self-pay | Admitting: Certified Registered Nurse Anesthetist

## 2013-09-02 ENCOUNTER — Ambulatory Visit (HOSPITAL_BASED_OUTPATIENT_CLINIC_OR_DEPARTMENT_OTHER): Payer: Medicare Other

## 2013-09-02 VITALS — BP 161/75 | HR 80 | Temp 97.1°F | Resp 20

## 2013-09-02 DIAGNOSIS — C8330 Diffuse large B-cell lymphoma, unspecified site: Secondary | ICD-10-CM

## 2013-09-02 DIAGNOSIS — Z5111 Encounter for antineoplastic chemotherapy: Secondary | ICD-10-CM

## 2013-09-02 DIAGNOSIS — C8589 Other specified types of non-Hodgkin lymphoma, extranodal and solid organ sites: Secondary | ICD-10-CM

## 2013-09-02 MED ORDER — DEXAMETHASONE SODIUM PHOSPHATE 10 MG/ML IJ SOLN
10.0000 mg | Freq: Once | INTRAMUSCULAR | Status: AC
  Administered 2013-09-02: 10 mg via INTRAVENOUS

## 2013-09-02 MED ORDER — ONDANSETRON 8 MG/NS 50 ML IVPB
INTRAVENOUS | Status: AC
  Filled 2013-09-02: qty 8

## 2013-09-02 MED ORDER — HEPARIN SOD (PORK) LOCK FLUSH 100 UNIT/ML IV SOLN
500.0000 [IU] | Freq: Once | INTRAVENOUS | Status: AC | PRN
  Administered 2013-09-02: 500 [IU]
  Filled 2013-09-02: qty 5

## 2013-09-02 MED ORDER — SODIUM CHLORIDE 0.9 % IJ SOLN
10.0000 mL | INTRAMUSCULAR | Status: DC | PRN
  Administered 2013-09-02: 10 mL
  Filled 2013-09-02: qty 10

## 2013-09-02 MED ORDER — ONDANSETRON 8 MG/50ML IVPB (CHCC)
8.0000 mg | Freq: Once | INTRAVENOUS | Status: AC
  Administered 2013-09-02: 8 mg via INTRAVENOUS

## 2013-09-02 MED ORDER — DEXAMETHASONE SODIUM PHOSPHATE 10 MG/ML IJ SOLN
INTRAMUSCULAR | Status: AC
  Filled 2013-09-02: qty 1

## 2013-09-02 MED ORDER — SODIUM CHLORIDE 0.9 % IV SOLN
50.0000 mg/m2 | Freq: Once | INTRAVENOUS | Status: AC
  Administered 2013-09-02: 100 mg via INTRAVENOUS
  Filled 2013-09-02: qty 5

## 2013-09-02 MED ORDER — SODIUM CHLORIDE 0.9 % IV SOLN
Freq: Once | INTRAVENOUS | Status: AC
  Administered 2013-09-02: 13:00:00 via INTRAVENOUS

## 2013-09-02 NOTE — Telephone Encounter (Signed)
S/w pt re appt for lb/Gabriel Estrada 9/15 @ 9:30am. Pt to get new schedule when he comes in as appts have been moved from fridays to mondays per 9/5 pof. Per pof move wkly lb from Friday to Monday starting 9/15 and next cycle of chemo to start Monday 9/29, 9/30, 10/1 and q3w.

## 2013-09-02 NOTE — Progress Notes (Signed)
Met with patient during his initial infusion to provide support and encouragement.  Pt tolerating procedure well; expressed no concerns.  Provided pt's sister with another business card.  Will continue to navigate as Level 1 patient.  Young Berry, RN, BSN, [facility] Navigator [phone]

## 2013-09-02 NOTE — Patient Instructions (Addendum)
[facility] Discharge Instructions for Patients Receiving Chemotherapy  Today you received the following chemotherapy agents :  Etoposide.  To help prevent nausea and vomiting after your treatment, we encourage you to take your nausea medication as instructed by your physician.   If you develop nausea and vomiting that is not controlled by your nausea medication, call the clinic.   BELOW ARE SYMPTOMS THAT SHOULD BE REPORTED IMMEDIATELY:  *FEVER GREATER THAN 100.5 F  *CHILLS WITH OR WITHOUT FEVER  NAUSEA AND VOMITING THAT IS NOT CONTROLLED WITH YOUR NAUSEA MEDICATION  *UNUSUAL SHORTNESS OF BREATH  *UNUSUAL BRUISING OR BLEEDING  TENDERNESS IN MOUTH AND THROAT WITH OR WITHOUT PRESENCE OF ULCERS  *URINARY PROBLEMS  *BOWEL PROBLEMS  UNUSUAL RASH Items with * indicate a potential emergency and should be followed up as soon as possible.  Feel free to call the clinic you have any questions or concerns. The clinic phone number is [phone].    

## 2013-09-03 ENCOUNTER — Ambulatory Visit (HOSPITAL_BASED_OUTPATIENT_CLINIC_OR_DEPARTMENT_OTHER): Payer: Medicare Other

## 2013-09-03 VITALS — BP 103/58 | HR 92 | Temp 96.9°F | Resp 17

## 2013-09-03 DIAGNOSIS — C8330 Diffuse large B-cell lymphoma, unspecified site: Secondary | ICD-10-CM

## 2013-09-03 DIAGNOSIS — Z5111 Encounter for antineoplastic chemotherapy: Secondary | ICD-10-CM

## 2013-09-03 DIAGNOSIS — C8589 Other specified types of non-Hodgkin lymphoma, extranodal and solid organ sites: Secondary | ICD-10-CM

## 2013-09-03 MED ORDER — ONDANSETRON 8 MG/50ML IVPB (CHCC)
8.0000 mg | Freq: Once | INTRAVENOUS | Status: AC
  Administered 2013-09-03: 8 mg via INTRAVENOUS

## 2013-09-03 MED ORDER — DEXAMETHASONE SODIUM PHOSPHATE 10 MG/ML IJ SOLN: 10.0000 mg | Freq: Once | INTRAMUSCULAR | Status: DC

## 2013-09-03 MED ORDER — SODIUM CHLORIDE 0.9 % IV SOLN
Freq: Once | INTRAVENOUS | Status: AC
  Administered 2013-09-03: 10:00:00 via INTRAVENOUS

## 2013-09-03 MED ORDER — DEXAMETHASONE SODIUM PHOSPHATE 10 MG/ML IJ SOLN
INTRAMUSCULAR | Status: AC
  Administered 2013-09-03: 10 mg
  Filled 2013-09-03: qty 1

## 2013-09-03 MED ORDER — ONDANSETRON 8 MG/NS 50 ML IVPB
INTRAVENOUS | Status: AC
  Filled 2013-09-03: qty 8

## 2013-09-03 MED ORDER — SODIUM CHLORIDE 0.9 % IJ SOLN
10.0000 mL | INTRAMUSCULAR | Status: DC | PRN
  Administered 2013-09-03: 10 mL
  Filled 2013-09-03: qty 10

## 2013-09-03 MED ORDER — HEPARIN SOD (PORK) LOCK FLUSH 100 UNIT/ML IV SOLN
500.0000 [IU] | Freq: Once | INTRAVENOUS | Status: AC | PRN
  Administered 2013-09-03: 500 [IU]
  Filled 2013-09-03: qty 5

## 2013-09-03 MED ORDER — SODIUM CHLORIDE 0.9 % IV SOLN
50.0000 mg/m2 | Freq: Once | INTRAVENOUS | Status: AC
  Administered 2013-09-03: 100 mg via INTRAVENOUS
  Filled 2013-09-03: qty 5

## 2013-09-03 NOTE — Patient Instructions (Signed)
Etoposide, VP-16 injection What is this medicine? ETOPOSIDE, VP-16 (e toe POE side) is a chemotherapy drug. It is used to treat testicular cancer, lung cancer, and other cancers. This medicine may be used for other purposes; ask your health care provider or pharmacist if you have questions. What should I tell my health care provider before I take this medicine? They need to know if you have any of these conditions: -infection -kidney disease -low blood counts, like low white cell, platelet, or red cell counts -an unusual or allergic reaction to etoposide, other chemotherapeutic agents, other medicines, foods, dyes, or preservatives -pregnant or trying to get pregnant -breast-feeding How should I use this medicine? This medicine is for infusion into a vein. It is administered in a hospital or clinic by a specially trained health care professional. Talk to your pediatrician regarding the use of this medicine in children. Special care may be needed. Overdosage: If you think you have taken too much of this medicine contact a poison control center or emergency room at once. NOTE: This medicine is only for you. Do not share this medicine with others. What if I miss a dose? It is important not to miss your dose. Call your doctor or health care professional if you are unable to keep an appointment. What may interact with this medicine? -cyclosporine -medicines to increase blood counts like filgrastim, pegfilgrastim, sargramostim -vaccines This list may not describe all possible interactions. Give your health care provider a list of all the medicines, herbs, non-prescription drugs, or dietary supplements you use. Also tell them if you smoke, drink alcohol, or use illegal drugs. Some items may interact with your medicine. What should I watch for while using this medicine? Visit your doctor for checks on your progress. This drug may make you feel generally unwell. This is not uncommon, as chemotherapy  can affect healthy cells as well as cancer cells. Report any side effects. Continue your course of treatment even though you feel ill unless your doctor tells you to stop. In some cases, you may be given additional medicines to help with side effects. Follow all directions for their use. Call your doctor or health care professional for advice if you get a fever, chills or sore throat, or other symptoms of a cold or flu. Do not treat yourself. This drug decreases your body's ability to fight infections. Try to avoid being around people who are sick. This medicine may increase your risk to bruise or bleed. Call your doctor or health care professional if you notice any unusual bleeding. Be careful brushing and flossing your teeth or using a toothpick because you may get an infection or bleed more easily. If you have any dental work done, tell your dentist you are receiving this medicine. Avoid taking products that contain aspirin, acetaminophen, ibuprofen, naproxen, or ketoprofen unless instructed by your doctor. These medicines may hide a fever. Do not become pregnant while taking this medicine. Women should inform their doctor if they wish to become pregnant or think they might be pregnant. There is a potential for serious side effects to an unborn child. Talk to your health care professional or pharmacist for more information. Do not breast-feed an infant while taking this medicine. What side effects may I notice from receiving this medicine? Side effects that you should report to your doctor or health care professional as soon as possible: -allergic reactions like skin rash, itching or hives, swelling of the face, lips, or tongue -low blood counts - this medicine   may decrease the number of white blood cells, red blood cells and platelets. You may be at increased risk for infections and bleeding. -signs of infection - fever or chills, cough, sore throat, pain or difficulty passing urine -signs of  decreased platelets or bleeding - bruising, pinpoint red spots on the skin, black, tarry stools, blood in the urine -signs of decreased red blood cells - unusually weak or tired, fainting spells, lightheadedness -breathing problems -changes in vision -mouth or throat sores or ulcers -pain, redness, swelling or irritation at the injection site -pain, tingling, numbness in the hands or feet -redness, blistering, peeling or loosening of the skin, including inside the mouth -seizures -vomiting Side effects that usually do not require medical attention (report to your doctor or health care professional if they continue or are bothersome): -diarrhea -hair loss -loss of appetite -nausea -stomach pain This list may not describe all possible side effects. Call your doctor for medical advice about side effects. You may report side effects to FDA at 1-800-FDA-1088. Where should I keep my medicine? This drug is given in a hospital or clinic and will not be stored at home. NOTE: This sheet is a summary. It may not cover all possible information. If you have questions about this medicine, talk to your doctor, pharmacist, or health care provider.  2012, [employer]vier/Gold Standard. (04/14/2008 5:24:12 PM) 

## 2013-09-04 ENCOUNTER — Telehealth: Payer: Self-pay | Admitting: *Deleted

## 2013-09-04 NOTE — Telephone Encounter (Signed)
Called pt at home for chemo f/u call.  Pt stated he was doing fine.  No problems at this time.  Pt understood to call office with any new issues.  Pt also aware of next office appts.

## 2013-09-06 ENCOUNTER — Other Ambulatory Visit: Payer: Medicare Other

## 2013-09-07 ENCOUNTER — Other Ambulatory Visit: Payer: Self-pay | Admitting: Nurse Practitioner

## 2013-09-09 ENCOUNTER — Ambulatory Visit (HOSPITAL_BASED_OUTPATIENT_CLINIC_OR_DEPARTMENT_OTHER): Payer: Medicare Other | Admitting: Family

## 2013-09-09 ENCOUNTER — Other Ambulatory Visit (HOSPITAL_BASED_OUTPATIENT_CLINIC_OR_DEPARTMENT_OTHER): Payer: Medicare Other | Admitting: Lab

## 2013-09-09 ENCOUNTER — Telehealth: Payer: Self-pay | Admitting: Hematology and Oncology

## 2013-09-09 ENCOUNTER — Telehealth: Payer: Self-pay | Admitting: *Deleted

## 2013-09-09 ENCOUNTER — Encounter: Payer: Self-pay | Admitting: Family

## 2013-09-09 VITALS — BP 145/64 | HR 79 | Temp 96.9°F | Resp 18 | Ht 72.0 in | Wt 164.0 lb

## 2013-09-09 DIAGNOSIS — C8589 Other specified types of non-Hodgkin lymphoma, extranodal and solid organ sites: Secondary | ICD-10-CM

## 2013-09-09 DIAGNOSIS — C8330 Diffuse large B-cell lymphoma, unspecified site: Secondary | ICD-10-CM

## 2013-09-09 LAB — CBC WITH DIFFERENTIAL/PLATELET
Eosinophils Absolute: 0.1 10*3/uL (ref 0.0–0.5)
LYMPH%: 18.9 % (ref 14.0–49.0)
MONO#: 0.1 10*3/uL (ref 0.1–0.9)
NEUT#: 1.8 10*3/uL (ref 1.5–6.5)
Platelets: 165 10*3/uL (ref 140–400)
RBC: 4 10*6/uL — ABNORMAL LOW (ref 4.20–5.82)
WBC: 2.4 10*3/uL — ABNORMAL LOW (ref 4.0–10.3)

## 2013-09-09 LAB — COMPREHENSIVE METABOLIC PANEL (CC13)
Albumin: 3.4 g/dL — ABNORMAL LOW (ref 3.5–5.0)
CO2: 28 mEq/L (ref 22–29)
Calcium: 9.5 mg/dL (ref 8.4–10.4)
Chloride: 105 mEq/L (ref 98–109)
Glucose: 91 mg/dl (ref 70–140)
Potassium: 4.9 mEq/L (ref 3.5–5.1)
Sodium: 141 mEq/L (ref 136–145)
Total Bilirubin: 0.88 mg/dL (ref 0.20–1.20)
Total Protein: 6.7 g/dL (ref 6.4–8.3)

## 2013-09-09 NOTE — Telephone Encounter (Signed)
error 

## 2013-09-09 NOTE — Patient Instructions (Addendum)
Please contact us at [phone] if you have any questions or concerns.  Please continue to do well and enjoy life!!!  Get plenty of rest, drink plenty of water, exercise daily (walking- as tolerated), eat a balanced diet.    Results for orders placed in visit on 09/09/13 (from the past 24 hour(s))  CBC WITH DIFFERENTIAL     Status: Abnormal   Collection Time    09/09/13  9:23 AM      Result Value Range   WBC 2.4 (*) 4.0 - 10.3 10e3/uL   NEUT# 1.8  1.5 - 6.5 10e3/uL   HGB 11.7 (*) 13.0 - 17.1 g/dL   HCT 16.1 (*) 09.6 - 04.5 %   Platelets 165  140 - 400 10e3/uL   MCV 86.8  79.3 - 98.0 fL   MCH 29.3  27.2 - 33.4 pg   MCHC 33.8  32.0 - 36.0 g/dL   RBC 4.09 (*) 8.11 - 9.14 10e6/uL   RDW 13.8  11.0 - 14.6 %   lymph# 0.5 (*) 0.9 - 3.3 10e3/uL   MONO# 0.1  0.1 - 0.9 10e3/uL   Eosinophils Absolute 0.1  0.0 - 0.5 10e3/uL   Basophils Absolute 0.1  0.0 - 0.1 10e3/uL   NEUT% 73.4  39.0 - 75.0 %   LYMPH% 18.9  14.0 - 49.0 %   MONO% 2.4  0.0 - 14.0 %   EOS% 2.8  0.0 - 7.0 %   BASO% 2.5 (*) 0.0 - 2.0 %   Narrative:    Performed At:  [facility]               [address]               [place], [place] [address]  COMPREHENSIVE METABOLIC PANEL (CC13)     Status: Abnormal   Collection Time    09/09/13  9:23 AM      Result Value Range   Sodium 141  136 - 145 mEq/L   Potassium 4.9  3.5 - 5.1 mEq/L   Chloride 105  98 - 109 mEq/L   CO2 28  22 - 29 mEq/L   Glucose 91  70 - 140 mg/dl   BUN 62.1  7.0 - 30.8 mg/dL   Creatinine 1.5 (*) 0.7 - 1.3 mg/dL   Total Bilirubin 6.57  0.20 - 1.20 mg/dL   Alkaline Phosphatase 72  40 - 150 U/L   AST 24  5 - 34 U/L   ALT 34  0 - 55 U/L   Total Protein 6.7  6.4 - 8.3 g/dL   Albumin 3.4 (*) 3.5 - 5.0 g/dL   Calcium 9.5  8.4 - 84.6 mg/dL   Narrative:    Note: New Reference ranges.Performed At:  [facility]               [address]               [place], [place] [address]

## 2013-09-09 NOTE — Progress Notes (Signed)
ID: Gabriel Estrada OB: 09-Oct-1928  MR#: [id number]  WUJ#:[id number]  [facility]  [phone] [phone]   OFFICE PROGRESS NOTE  PCP: [name], MD  DIAGNOSIS: 1. 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   PAST THERAPY: Biopsy only.   DIAGNOSIS: 2. Low grade papillary urothelial carcinoma   PAST THERAPY: Cystoscopy Transurethral resection of bladder tumor (3 cm) Bilateral retrograde pyelography with interpretation Pelvic examination under anesthesia   CURRENT THERAPY: due to start therapy soon.   INTERVAL HISTORY:  Gabriel Estrada 79 years old male returns for regular follow up to discuss his treatment options of DLBCL. After last visit her was admitted to the hospital for acute on chronic systolic CHF and slowly recover after that. He reported good appetite. Patient complains on dyspnea. He had history of blood in urine. The patient denied fever, chills, night sweats, change in appetite or weight. He denied headaches, double vision, blurry vision, nasal congestion, nasal discharge, hearing problems, odynophagia or dysphagia. No chest pain, palpitations, cough, abdominal pain, nausea, vomiting, diarrhea, constipation, hematochezia. The patient denied dysuria, nocturia, polyuria, hematuria, myalgia, numbness, tingling, psychiatric problems. Review of Systems  Constitutional: Negative for fever, chills, weight loss, malaise/fatigue and diaphoresis.  HENT: Negative for hearing loss, nosebleeds, congestion, sore throat, neck pain and tinnitus.   Eyes: Negative for blurred vision, double vision, photophobia, pain and discharge.  Respiratory: Positive for shortness of breath. Negative for cough, hemoptysis, sputum production, wheezing and stridor.   Cardiovascular: Negative for chest pain, palpitations, orthopnea and claudication.  Gastrointestinal: Negative for heartburn, nausea, vomiting, abdominal pain, diarrhea, constipation, blood in stool and  melena.  Genitourinary: Positive for hematuria. Negative for dysuria, urgency, frequency and flank pain.  Musculoskeletal: Negative for myalgias, back pain, joint pain and falls.  Skin: Negative for itching and rash.  Neurological: Negative for dizziness, tingling, tremors, sensory change, speech change, focal weakness, seizures, loss of consciousness, weakness and headaches.  Endo/Heme/Allergies: Does not bruise/bleed easily.  Psychiatric/Behavioral: Negative.     PAST MEDICAL HISTORY: Past Medical History  Diagnosis Date  . Coronary artery disease     a. 02/2001 MI/Cath/CABG x 5: LIMA->LAD, VG->D1, VG->OM2, VG->PDA->LPL & MV Ring (#30 Seguin ring annuloplasty)  . PVD (peripheral vascular disease)   . Hyperlipidemia   . Mitral regurgitation     a. 02/2001 s/p #30 Seguin ring annuloplasty.  . Chronic systolic CHF (congestive heart failure)     a. 05/2013 Echo: appears to be signif overall LV dysfxn, cannot estimate EF.  Ao sclerosis;  b. 06/2013 Echo: Poor windows, ? septal and apical HK, mild LVH, EF likely low normal  . Hypertension   . CKD (chronic kidney disease), stage III   . Teeth missing     all teeth pulled-06-13-13  . A-fib     a. chronic-Dr. Hochrein,cardiology;  b. on coumadin.  [name] Lymphoma     Nasal Mass-left-Dr. Ha,oncology  . Bladder mass 07-03-13  . Carotid artery disease     a. 09/2012 U/S: 0-39% bilat ICA stenosis.    PAST SURGICAL HISTORY: Past Surgical History  Procedure Laterality Date  . Coronary artery bypass graft  03/15/01    x 5  . Multiple extractions with alveoloplasty N/A 05/30/2013    Procedure: Extraction of tooth #'s 2,6,7,8,10,11,12,18,20,21,22,23,24,25,26,27,28,29 with alveoloplasty and mandibular tori reductions, and lateral exostoses reductions.;  Surgeon: Charlynne Pander, DDS;  Location: [facility] OR;  Service: Oral Surgery;  Laterality: N/A;  . Soft tissue biopsy  Diffuse B-Cell Lynphoma/ Nasal mucosa, biopsy, left,left nasal mass  . Portacath  placement Right     right chest  . Cystoscopy w/ retrogrades Bilateral 07/08/2013    Procedure: CYSTOSCOPY WITH RETROGRADE PYELOGRAM;  Surgeon: Crecencio Mc, MD;  Location: [facility] ORS;  Service: Urology;  Laterality: Bilateral;  GENERAL ANESTHESIA WITH PARALYSIS, EXAM UNDER ANESTHESIA        FAMILY HISTORY Family History  Problem Relation Age of Onset  . Heart attack Mother   . Heart disease Sister   . Heart attack Brother   . Heart attack Brother   . Heart disease Sister   . Heart disease Sister   . Cancer Father     lung?  . Cancer Maternal Aunt     kidney cancer     HEALTH MAINTENANCE: History  Substance Use Topics  . Smoking status: Former Smoker -- 1.00 packs/day for 20 years    Quit date: 12/26/2010  . Smokeless tobacco: Never Used  . Alcohol Use: No     Comment: Used to drink beer, but not anymore.      No Known Allergies  Current Outpatient Prescriptions  Medication Sig Dispense Refill  . carvedilol (COREG) 6.25 MG tablet Take 1 tablet (6.25 mg total) by mouth 2 (two) times daily with a meal.  30 tablet  6  . digoxin (LANOXIN) 0.125 MG tablet Take 0.125 mg by mouth every evening.       . docusate sodium (COLACE) 100 MG capsule Take 1 capsule (100 mg total) by mouth 2 (two) times daily.  30 capsule  0  . ezetimibe-simvastatin (VYTORIN) 10-40 MG per tablet Take 1 tablet by mouth at bedtime.      . furosemide (LASIX) 20 MG tablet Take 1 tablet (20 mg total) by mouth daily.  30 tablet  6  . HYDROcodone-acetaminophen (NORCO/VICODIN) 5-325 MG per tablet Take 1-2 tablets by mouth every 6 (six) hours as needed for pain.  25 tablet  0  . lidocaine-prilocaine (EMLA) cream Apply topically as needed.  30 g  2  . Multiple Vitamin (MULTIVITAMIN WITH MINERALS) TABS Take 1 tablet by mouth daily.      . ondansetron (ZOFRAN) 8 MG tablet Take 1 tablet (8 mg total) by mouth every 8 (eight) hours as needed for nausea.  20 tablet  0  . phenazopyridine (PYRIDIUM) 100 MG tablet Take 1  tablet (100 mg total) by mouth 3 (three) times daily as needed for pain (for burning).  20 tablet  0  . tamsulosin (FLOMAX) 0.4 MG CAPS Take 0.4 mg by mouth 1 day or 1 dose. bedtime      . warfarin (COUMADIN) 5 MG tablet Take 2.5-5 mg by mouth daily. Take 5mg  daily except 2.5mg  on Tuesdays and Thursdays      . allopurinol (ZYLOPRIM) 300 MG tablet Take 1 tablet (300 mg total) by mouth daily.  30 tablet  3  . etoposide (VEPESID) 50 MG capsule Take 4 capsules (200 mg total) by mouth daily.  4 capsule  0  . LORazepam (ATIVAN) 0.5 MG tablet Take 1 tablet (0.5 mg total) by mouth every 6 (six) hours as needed (Nausea or vomiting).  30 tablet  0  . ondansetron (ZOFRAN) 8 MG tablet Take 1 tablet (8 mg total) by mouth 2 (two) times daily. Take two times a day starting the day after chemo for 3 days. Then take two times a day as needed for nausea or vomiting.  30 tablet  1  . predniSONE (  DELTASONE) 20 MG tablet Take 4 tablets (80 mg total) by mouth daily. Take on days 1-5 of chemotherapy.  20 tablet  6  . prochlorperazine (COMPAZINE) 10 MG tablet Take 1 tablet (10 mg total) by mouth every 6 (six) hours as needed (Nausea or vomiting).  30 tablet  6   No current facility-administered medications for this visit.    OBJECTIVE: Filed Vitals:   08/21/13 1204  BP: 118/59  Pulse: 68     Body mass index is 21.8 kg/(m^2).    ECOG FS: 1  PHYSICAL EXAMINATION:  HEENT: Sclerae anicteric.  Conjunctivae were pink. Pupils round and reactive bilaterally. Oral mucosa is moist without ulceration or thrush. No occipital, submandibular, cervical, supraclavicular or axillar adenopathy. Lungs: clear to auscultation without wheezes. No rales or rhonchi. Heart: regular rate and rhythm. No murmur, gallop or rubs. Abdomen: soft, non tender. No guarding or rebound tenderness. Bowel sounds are present. No palpable hepatosplenomegaly. MSK: no focal spinal tenderness. Extremities: No clubbing or cyanosis.No calf tenderness to  palpitation, +2 peripheral edema.  Skin exam was without ecchymosis, petechiae. Neuro: non-focal, alert and oriented to time, person and place, appropriate affect  LAB RESULTS:  CMP     Component Value Date/Time   NA 138 08/30/2013 0858   NA 139 08/02/2013 1621   K 4.7 08/30/2013 0858   K 4.5 08/02/2013 1621   CL 100 08/02/2013 1621   CL 104 05/29/2013 1328   CO2 24 08/30/2013 0858   CO2 29 08/02/2013 1621   GLUCOSE 114 08/30/2013 0858   GLUCOSE 86 08/02/2013 1621   GLUCOSE 98 05/29/2013 1328   BUN 30.5* 08/30/2013 0858   BUN 40* 08/02/2013 1621   CREATININE 1.5* 08/30/2013 0858   CREATININE 1.92* 08/02/2013 1621   CREATININE 2.2* 07/29/2013 0852   CALCIUM 9.4 08/30/2013 0858   CALCIUM 9.5 08/02/2013 1621   PROT 7.1 08/30/2013 0858   PROT 6.3 07/22/2013 1250   ALBUMIN 3.5 08/30/2013 0858   ALBUMIN 3.3* 07/22/2013 1250   AST 29 08/30/2013 0858   AST 32 07/22/2013 1250   ALT 23 08/30/2013 0858   ALT 37 07/22/2013 1250   ALKPHOS 80 08/30/2013 0858   ALKPHOS 69 07/22/2013 1250   BILITOT 0.77 08/30/2013 0858   BILITOT 1.1 07/22/2013 1250   GFRNONAA 29* 07/26/2013 0607   GFRAA 34* 07/26/2013 0607    Lab Results  Component Value Date   WBC 6.6 08/30/2013   NEUTROABS 5.1 08/30/2013   HGB 12.6* 08/30/2013   HCT 38.6 08/30/2013   MCV 88.1 08/30/2013   PLT 153 08/30/2013      Chemistry      Component Value Date/Time   NA 138 08/30/2013 0858   NA 139 08/02/2013 1621   K 4.7 08/30/2013 0858   K 4.5 08/02/2013 1621   CL 100 08/02/2013 1621   CL 104 05/29/2013 1328   CO2 24 08/30/2013 0858   CO2 29 08/02/2013 1621   BUN 30.5* 08/30/2013 0858   BUN 40* 08/02/2013 1621   CREATININE 1.5* 08/30/2013 0858   CREATININE 1.92* 08/02/2013 1621   CREATININE 2.2* 07/29/2013 0852      Component Value Date/Time   CALCIUM 9.4 08/30/2013 0858   CALCIUM 9.5 08/02/2013 1621   ALKPHOS 80 08/30/2013 0858   ALKPHOS 69 07/22/2013 1250   AST 29 08/30/2013 0858   AST 32 07/22/2013 1250   ALT 23 08/30/2013 0858   ALT 37 07/22/2013 1250   BILITOT 0.77 08/30/2013 0858   BILITOT 1.1  07/22/2013 1250      Urinalysis    Component Value Date/Time   COLORURINE YELLOW 07/25/2013 1740   APPEARANCEUR CLEAR 07/25/2013 1740   LABSPEC 1.020 07/25/2013 1740   PHURINE 6.0 07/25/2013 1740   GLUCOSEU NEGATIVE 07/25/2013 1740   HGBUR MODERATE* 07/25/2013 1740   BILIRUBINUR NEGATIVE 07/25/2013 1740   KETONESUR NEGATIVE 07/25/2013 1740   PROTEINUR NEGATIVE 07/25/2013 1740   UROBILINOGEN 1.0 07/25/2013 1740   NITRITE NEGATIVE 07/25/2013 1740   LEUKOCYTESUR LARGE* 07/25/2013 1740    STUDIES: No results found.  ASSESSMENT AND PLAN: DLBCL. Probably stage I. We discussed treatment options stage I and II DLBCL which NCCN guidelines offer. Patient is not eligible for RCHOP but can be treated by RCEOP. This regiment tolerated well elderly patients with poor cardiac function. Patient not sure how aggressive he should be treated. He does not want to be treated with RCHOP. He will think about RCEOP and give [place] a call.   Myra Rude, MD   09/09/2013 6:55 AM

## 2013-09-09 NOTE — Progress Notes (Signed)
ID: [name] OB: 08-29-1928  MR#: [id number]  NWG#:[id number]  [facility]  [phone] [phone]   OFFICE PROGRESS NOTE  PCP: [name], MD  DIAGNOSIS: 1. 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   PAST THERAPY: Biopsy only.   DIAGNOSIS: 2. Low grade papillary urothelial carcinoma   PAST THERAPY: Cystoscopy Transurethral resection of bladder tumor (3 cm) Bilateral retrograde pyelography with interpretation Pelvic examination under anesthesia   CURRENT THERAPY: RCEOP cycle 1 today  INTERVAL HISTORY:  Gabriel Estrada 79 years old male returns for  follow up visit to start RCEOP. [name] He has good appetite. He complains on nasal congestion, dyspnea on minimal exertion and nocturia. The patient denied fever, chills, night sweats, change in appetite or weight. He denied headaches, double vision, blurry vision,nasal discharge, hearing problems, odynophagia or dysphagia. No chest pain, palpitations,  cough, abdominal pain, nausea, vomiting, diarrhea, constipation, hematochezia. The patient denied dysuria, polyuria, hematuria, myalgia, numbness, tingling, psychiatric problems. Review of Systems  Constitutional: Negative for fever, chills, weight loss, malaise/fatigue and diaphoresis.  HENT: Positive for congestion. Negative for hearing loss, ear pain, nosebleeds, sore throat, neck pain, tinnitus and ear discharge.   Eyes: Negative for blurred vision, double vision and photophobia.  Respiratory: Positive for shortness of breath. Negative for cough, hemoptysis, sputum production, wheezing and stridor.   Cardiovascular: Negative for chest pain, palpitations, orthopnea, claudication, leg swelling and PND.  Gastrointestinal: Negative for heartburn, nausea, vomiting, abdominal pain, diarrhea and constipation.  Genitourinary: Negative for dysuria, urgency, frequency, hematuria and flank pain.  Musculoskeletal: Negative for myalgias, back pain and joint  pain.  Skin: Negative for rash.  Neurological: Negative for dizziness, tingling, tremors, sensory change, speech change, focal weakness, seizures, loss of consciousness, weakness and headaches.  Endo/Heme/Allergies: Does not bruise/bleed easily.  Psychiatric/Behavioral: Negative.    PAST MEDICAL HISTORY: Past Medical History  Diagnosis Date  . Coronary artery disease     a. 02/2001 MI/Cath/CABG x 5: LIMA->LAD, VG->D1, VG->OM2, VG->PDA->LPL & MV Ring (#30 Seguin ring annuloplasty)  . PVD (peripheral vascular disease)   . Hyperlipidemia   . Mitral regurgitation     a. 02/2001 s/p #30 Seguin ring annuloplasty.  . Chronic systolic CHF (congestive heart failure)     a. 05/2013 Echo: appears to be signif overall LV dysfxn, cannot estimate EF.  Ao sclerosis;  b. 06/2013 Echo: Poor windows, ? septal and apical HK, mild LVH, EF likely low normal  . Hypertension   . CKD (chronic kidney disease), stage III   . Teeth missing     all teeth pulled-06-13-13  . A-fib     a. chronic-Dr. Hochrein,cardiology;  b. on coumadin.  [name] Lymphoma     Nasal Mass-left-Dr. Ha,oncology  . Bladder mass 07-03-13  . Carotid artery disease     a. 09/2012 U/S: 0-39% bilat ICA stenosis.    PAST SURGICAL HISTORY: Past Surgical History  Procedure Laterality Date  . Coronary artery bypass graft  03/15/01    x 5  . Multiple extractions with alveoloplasty N/A 05/30/2013    Procedure: Extraction of tooth #'s 2,6,7,8,10,11,12,18,20,21,22,23,24,25,26,27,28,29 with alveoloplasty and mandibular tori reductions, and lateral exostoses reductions.;  Surgeon: Charlynne Pander, DDS;  Location: [facility] OR;  Service: Oral Surgery;  Laterality: N/A;  . Soft tissue biopsy       Diffuse B-Cell Lynphoma/ Nasal mucosa, biopsy, left,left nasal mass  . Portacath placement Right     right chest  . Cystoscopy w/ retrogrades Bilateral 07/08/2013  Procedure: CYSTOSCOPY WITH RETROGRADE PYELOGRAM;  Surgeon: Crecencio Mc, MD;  Location: [facility] ORS;  Service:  Urology;  Laterality: Bilateral;  GENERAL ANESTHESIA WITH PARALYSIS, EXAM UNDER ANESTHESIA        FAMILY HISTORY Family History  Problem Relation Age of Onset  . Heart attack Mother   . Heart disease Sister   . Heart attack Brother   . Heart attack Brother   . Heart disease Sister   . Heart disease Sister   . Cancer Father     lung?  . Cancer Maternal Aunt     kidney cancer    HEALTH MAINTENANCE: History  Substance Use Topics  . Smoking status: Former Smoker -- 1.00 packs/day for 20 years    Quit date: 12/26/2010  . Smokeless tobacco: Never Used  . Alcohol Use: No     Comment: Used to drink beer, but not anymore.    No Known Allergies  Current Outpatient Prescriptions  Medication Sig Dispense Refill  . carvedilol (COREG) 6.25 MG tablet Take 1 tablet (6.25 mg total) by mouth 2 (two) times daily with a meal.  30 tablet  6  . digoxin (LANOXIN) 0.125 MG tablet Take 0.125 mg by mouth every evening.       . docusate sodium (COLACE) 100 MG capsule Take 1 capsule (100 mg total) by mouth 2 (two) times daily.  30 capsule  0  . ezetimibe-simvastatin (VYTORIN) 10-40 MG per tablet Take 1 tablet by mouth at bedtime.      . furosemide (LASIX) 20 MG tablet Take 1 tablet (20 mg total) by mouth daily.  30 tablet  6  . lidocaine-prilocaine (EMLA) cream Apply topically as needed.  30 g  2  . Multiple Vitamin (MULTIVITAMIN WITH MINERALS) TABS Take 1 tablet by mouth daily.      . ondansetron (ZOFRAN) 8 MG tablet Take 1 tablet (8 mg total) by mouth every 8 (eight) hours as needed for nausea.  20 tablet  0  . phenazopyridine (PYRIDIUM) 100 MG tablet Take 1 tablet (100 mg total) by mouth 3 (three) times daily as needed for pain (for burning).  20 tablet  0  . tamsulosin (FLOMAX) 0.4 MG CAPS Take 0.4 mg by mouth 1 day or 1 dose. bedtime      . warfarin (COUMADIN) 5 MG tablet Take 2.5-5 mg by mouth daily. Take 5mg  daily except 2.5mg  on Tuesdays and Thursdays      . allopurinol (ZYLOPRIM) 300 MG  tablet Take 1 tablet (300 mg total) by mouth daily.  30 tablet  3  . etoposide (VEPESID) 50 MG capsule Take 4 capsules (200 mg total) by mouth daily.  4 capsule  0  . HYDROcodone-acetaminophen (NORCO/VICODIN) 5-325 MG per tablet Take 1-2 tablets by mouth every 6 (six) hours as needed for pain.  25 tablet  0  . LORazepam (ATIVAN) 0.5 MG tablet Take 1 tablet (0.5 mg total) by mouth every 6 (six) hours as needed (Nausea or vomiting).  30 tablet  0  . ondansetron (ZOFRAN) 8 MG tablet Take 1 tablet (8 mg total) by mouth 2 (two) times daily. Take two times a day starting the day after chemo for 3 days. Then take two times a day as needed for nausea or vomiting.  30 tablet  1  . predniSONE (DELTASONE) 20 MG tablet Take 4 tablets (80 mg total) by mouth daily. Take on days 1-5 of chemotherapy.  20 tablet  6  . prochlorperazine (COMPAZINE) 10 MG tablet Take 1  tablet (10 mg total) by mouth every 6 (six) hours as needed (Nausea or vomiting).  30 tablet  6   No current facility-administered medications for this visit.    OBJECTIVE: Filed Vitals:   08/30/13 0910  BP: 113/59  Pulse: 70  Resp: 19     Body mass index is 22.09 kg/(m^2).      PHYSICAL EXAMINATION:  HEENT: Sclerae anicteric.  Conjunctivae were pink. Pupils round and reactive bilaterally. Oral mucosa is moist without ulceration or thrush. No occipital, submandibular, cervical, supraclavicular or axillar adenopathy. Lungs: clear to auscultation without wheezes. No rales or rhonchi. Heart: regular rate and rhythm. No murmur, gallop or rubs. Abdomen: soft, non tender. No guarding or rebound tenderness. Bowel sounds are present. No palpable hepatosplenomegaly. MSK: no focal spinal tenderness. Extremities: No clubbing or cyanosis.No calf tenderness to palpitation, +3 peripheral edema. The patient had grossly intact strength in upper and lower extremities. Skin exam was without ecchymosis, petechiae. Neuro: non-focal, alert and oriented to time,  person and place, appropriate affect  LAB RESULTS:  CMP     Component Value Date/Time   NA 138 08/30/2013 0858   NA 139 08/02/2013 1621   K 4.7 08/30/2013 0858   K 4.5 08/02/2013 1621   CL 100 08/02/2013 1621   CL 104 05/29/2013 1328   CO2 24 08/30/2013 0858   CO2 29 08/02/2013 1621   GLUCOSE 114 08/30/2013 0858   GLUCOSE 86 08/02/2013 1621   GLUCOSE 98 05/29/2013 1328   BUN 30.5* 08/30/2013 0858   BUN 40* 08/02/2013 1621   CREATININE 1.5* 08/30/2013 0858   CREATININE 1.92* 08/02/2013 1621   CREATININE 2.2* 07/29/2013 0852   CALCIUM 9.4 08/30/2013 0858   CALCIUM 9.5 08/02/2013 1621   PROT 7.1 08/30/2013 0858   PROT 6.3 07/22/2013 1250   ALBUMIN 3.5 08/30/2013 0858   ALBUMIN 3.3* 07/22/2013 1250   AST 29 08/30/2013 0858   AST 32 07/22/2013 1250   ALT 23 08/30/2013 0858   ALT 37 07/22/2013 1250   ALKPHOS 80 08/30/2013 0858   ALKPHOS 69 07/22/2013 1250   BILITOT 0.77 08/30/2013 0858   BILITOT 1.1 07/22/2013 1250   GFRNONAA 29* 07/26/2013 0607   GFRAA 34* 07/26/2013 0607    Lab Results  Component Value Date   WBC 6.6 08/30/2013   NEUTROABS 5.1 08/30/2013   HGB 12.6* 08/30/2013   HCT 38.6 08/30/2013   MCV 88.1 08/30/2013   PLT 153 08/30/2013      Chemistry      Component Value Date/Time   NA 138 08/30/2013 0858   NA 139 08/02/2013 1621   K 4.7 08/30/2013 0858   K 4.5 08/02/2013 1621   CL 100 08/02/2013 1621   CL 104 05/29/2013 1328   CO2 24 08/30/2013 0858   CO2 29 08/02/2013 1621   BUN 30.5* 08/30/2013 0858   BUN 40* 08/02/2013 1621   CREATININE 1.5* 08/30/2013 0858   CREATININE 1.92* 08/02/2013 1621   CREATININE 2.2* 07/29/2013 0852      Component Value Date/Time   CALCIUM 9.4 08/30/2013 0858   CALCIUM 9.5 08/02/2013 1621   ALKPHOS 80 08/30/2013 0858   ALKPHOS 69 07/22/2013 1250   AST 29 08/30/2013 0858   AST 32 07/22/2013 1250   ALT 23 08/30/2013 0858   ALT 37 07/22/2013 1250   BILITOT 0.77 08/30/2013 0858   BILITOT 1.1 07/22/2013 1250      STUDIES: No results found.  ASSESSMENT AND PLAN: DLBCL. Probably stage I.  Patient decided  to proceed  with RCEOP treatment. Side effects was discussed. We will proceed today with cycle 1 of RCEOP. We will see patient in 10 days. If patient will tolerate chemo well, I will consider repeat baseline PET/CT (previous done 06/05/13).  Myra Rude, MD   09/09/2013 7:24 AM

## 2013-09-09 NOTE — Progress Notes (Signed)
[facility] OFFICE PROGRESS NOTE  [name], MD [phone], [address] [place] [address]  DIAGNOSIS: DLBCL (diffuse large B cell lymphoma)  SUMMARY OF ONCOLOGIC HISTORY: I have seen the patient and examined him and agree with the assessment and plan as outlined by my nurse practitioner, Larina Bras.  INTERVAL HISTORY: Gabriel Estrada 79 y.o. male returns for regular clinic visit prior to cycle 4 of chemotherapy. I have reviewed his prior history extensively. The patient has no new complaints. He tolerated chemotherapy very well. He denies nausea, vomiting, fevers chills, mouth sores or bleeding.  I have reviewed the past medical history, past surgical history, social history and family history with the patient and they are unchanged from previous note.  ALLERGIES:  has No Known Allergies.  MEDICATIONS: has a current medication list which includes the following prescription(s): allopurinol, carvedilol, digoxin, docusate sodium, etoposide, ezetimibe-simvastatin, furosemide, lidocaine-prilocaine, multivitamin with minerals, ondansetron, phenazopyridine, prednisone, tamsulosin, warfarin, hydrocodone-acetaminophen, lorazepam, and prochlorperazine.  REVIEW OF SYSTEMS:   Constitutional: Denies fevers, chills or abnormal weight loss Eyes: Denies blurriness of vision Ears, nose, mouth, throat, and face: Denies mucositis or sore throat Respiratory: Denies cough, dyspnea or wheezes Cardiovascular: Denies palpitation, chest discomfort or lower extremity swelling Gastrointestinal:  Denies nausea, heartburn or change in bowel habits Skin: Denies abnormal skin rashes Lymphatics: Denies new lymphadenopathy or easy bruising Neurological:Denies numbness, tingling or new weaknesses Behavioral/Psych: Mood is stable, no new changes  All other systems were reviewed with the patient and are negative.  PHYSICAL EXAMINATION: ECOG PERFORMANCE STATUS:  1 - Symptomatic but completely ambulatory  Filed Vitals:   09/09/13 0945  BP: 145/64  Pulse: 79  Temp: 96.9 F (36.1 C)  Resp: 18    GENERAL:alert, no distress and comfortable SKIN: skin color, texture, turgor are normal, no rashes or significant lesions EYES: normal, Conjunctiva are pink and non-injected, sclera clear OROPHARYNX:no exudate, no erythema and lips, buccal mucosa, and tongue normal  NECK: supple, thyroid normal size, non-tender, without nodularity LYMPH:  no palpable lymphadenopathy in the cervical, axillary or inguinal LUNGS: clear to auscultation and percussion with normal breathing effort HEART: regular rate & rhythm and no murmurs and no lower extremity edema ABDOMEN:abdomen soft, non-tender and normal bowel sounds Musculoskeletal:no cyanosis of digits and no clubbing  NEURO: alert & oriented x 3 with fluent speech, no focal motor/sensory deficits  LABORATORY DATA:  I have reviewed the data as listed Results for orders placed in visit on 09/09/13 (from the past 48 hour(s))  CBC WITH DIFFERENTIAL     Status: Abnormal   Collection Time    09/09/13  9:23 AM      Result Value Range   WBC 2.4 (*) 4.0 - 10.3 10e3/uL   NEUT# 1.8  1.5 - 6.5 10e3/uL   HGB 11.7 (*) 13.0 - 17.1 g/dL   HCT 40.9 (*) 81.1 - 91.4 %   Platelets 165  140 - 400 10e3/uL   MCV 86.8  79.3 - 98.0 fL   MCH 29.3  27.2 - 33.4 pg   MCHC 33.8  32.0 - 36.0 g/dL   RBC 7.82 (*) 9.56 - 2.13 10e6/uL   RDW 13.8  11.0 - 14.6 %   lymph# 0.5 (*) 0.9 - 3.3 10e3/uL   MONO# 0.1  0.1 - 0.9 10e3/uL   Eosinophils Absolute 0.1  0.0 - 0.5 10e3/uL   Basophils Absolute 0.1  0.0 - 0.1 10e3/uL   NEUT% 73.4  39.0 - 75.0 %  LYMPH% 18.9  14.0 - 49.0 %   MONO% 2.4  0.0 - 14.0 %   EOS% 2.8  0.0 - 7.0 %   BASO% 2.5 (*) 0.0 - 2.0 %  COMPREHENSIVE METABOLIC PANEL (CC13)     Status: Abnormal   Collection Time    09/09/13  9:23 AM      Result Value Range   Sodium 141  136 - 145 mEq/L   Potassium 4.9  3.5 - 5.1 mEq/L    Chloride 105  98 - 109 mEq/L   CO2 28  22 - 29 mEq/L   Glucose 91  70 - 140 mg/dl   BUN 16.1  7.0 - 09.6 mg/dL   Creatinine 1.5 (*) 0.7 - 1.3 mg/dL   Total Bilirubin 0.45  0.20 - 1.20 mg/dL   Alkaline Phosphatase 72  40 - 150 U/L   AST 24  5 - 34 U/L   ALT 34  0 - 55 U/L   Total Protein 6.7  6.4 - 8.3 g/dL   Albumin 3.4 (*) 3.5 - 5.0 g/dL   Calcium 9.5  8.4 - 40.9 mg/dL     ASSESSMENT: Stage I diffuse large B-cell lymphoma, ongoing chemotherapy.   PLAN: Please see the assessment and plan as outlined by my nurse practitioner. I have agreed with the plan of care.  All questions were answered. The patient knows to call the clinic with any problems, questions or concerns. We can certainly see the patient much sooner if necessary. No barriers to learning was detected.  The patient and plan discussed with [facility], Gerald Honea  and he is in agreement with the aforementioned.  I spent 25 minutes counseling the patient face to face. The total time spent in the appointment was 40 minutes and more than 50% was on counseling.     [facility], Antwuan Eckley, MD 09/09/2013 4:10 PM

## 2013-09-10 NOTE — Progress Notes (Addendum)
[facility]  [phone] [phone]  OFFICE PROGRESS NOTE   Patient ID: Gabriel Estrada  DOB: 01-Nov-1928, 79 y.o.  VHQ:[id number]      UXL:[id number]  PCP:  [name], MD URO:  Less Laverle Patter, M.D. ENT: Hermelinda Medicus, M.D.   Diagnosis: Gabriel Estrada is a 79 y.o. male with  stage I diffuse large B-cell lymphoma (DLBCL).   Prior Therapy: 1.  The patient had a left nasal mucosa biopsy and left nasal mass soft tissue biopsy on 12/12/2012 which showed large B-cell lymphoma-non-Hodgkin's lymphoma, diffuse large cell type, high grade.  2.  PET scan on 06/05/2013 showed a 4.1 x 1.7 cm left nasal mass, corresponding to known lymphoma, focal patchy/nodular opacity in posterior left upper lobe favored to be infectious/inflammatory, 2.6 x 1.1 cm mass along left posterior lateral bladder wall, suspicious for a primary bladder neoplasm.  2.  Status post transurethral bladder resection of 3 cm bladder tumor with bilateral retrograde pyelography on 07/08/2013 for a low-grade papillary urothelial carcinoma.   3.  The patient is currently receiving chemotherapy consisting of RCEOP (Rituxan/Cytoxan/Etoposide/Vincristine/Prednisone) and has received 3 cycles thus far.   Current Therapy: He presents today to receive scheduled chemotherapy cycle 4 of 6 consisting of RCEOP.   Interval History: Dr. Bertis Estrada and I saw Gabriel Estrada today for an assessment and laboratories prior to chemotherapy cycle 4.  Gabriel Estrada was last seen by Dr. Roanna Raider on 08/21/2013.  He is accompanied for today's office visit by his sister [name].  Since his last office visit the patient states his shortness of breath with exertion continues.  Oxygen saturation 98% on room air today.  Of note, the patient has a history of COPD.  He denies any other symptomatology.  His interval history is otherwise unremarkable.   Medical History: Past Medical History  Diagnosis Date  . Coronary  artery disease     a. 02/2001 MI/Cath/CABG x 5: LIMA->LAD, VG->D1, VG->OM2, VG->PDA->LPL & MV Ring (#30 Seguin ring annuloplasty)  . PVD (peripheral vascular disease)   . Hyperlipidemia   . Mitral regurgitation     a. 02/2001 s/p #30 Seguin ring annuloplasty.  . Chronic systolic CHF (congestive heart failure)     a. 05/2013 Echo: appears to be signif overall LV dysfxn, cannot estimate EF.  Ao sclerosis;  b. 06/2013 Echo: Poor windows, ? septal and apical HK, mild LVH, EF likely low normal  . Hypertension   . CKD (chronic kidney disease), stage III   . Teeth missing     all teeth pulled-06-13-13  . A-fib     a. chronic-Dr. Hochrein,cardiology;  b. on coumadin.  [name] Lymphoma     Nasal Mass-left-Dr. Ha,oncology  . Bladder mass 07-03-13  . Carotid artery disease     a. 09/2012 U/S: 0-39% bilat ICA stenosis.    Surgical History: Past Surgical History  Procedure Laterality Date  . Coronary artery bypass graft  03/15/01    x 5  . Multiple extractions with alveoloplasty N/A 05/30/2013    Procedure: Extraction of tooth #'s 2,6,7,8,10,11,12,18,20,21,22,23,24,25,26,27,28,29 with alveoloplasty and mandibular tori reductions, and lateral exostoses reductions.;  Surgeon: Charlynne Pander, DDS;  Location: [facility] OR;  Service: Oral Surgery;  Laterality: N/A;  . Soft tissue biopsy       Diffuse B-Cell Lynphoma/ Nasal mucosa, biopsy, left,left nasal mass  . Portacath placement Right     right chest  . Cystoscopy w/ retrogrades Bilateral 07/08/2013    Procedure: CYSTOSCOPY WITH RETROGRADE PYELOGRAM;  Surgeon: Crecencio Mc, MD;  Location: [facility] ORS;  Service: Urology;  Laterality: Bilateral;  GENERAL ANESTHESIA WITH PARALYSIS, EXAM UNDER ANESTHESIA        Family History: Family History  Problem Relation Age of Onset  . Heart attack Mother   . Heart disease Sister   . Heart attack Brother   . Heart attack Brother   . Heart disease Sister   . Heart disease Sister   . Cancer Father     lung?  . Cancer  Maternal Aunt     kidney cancer    Social History: History  Substance Use Topics  . Smoking status: Former Smoker -- 1.00 packs/day for 20 years    Quit date: 12/26/2010  . Smokeless tobacco: Never Used  . Alcohol Use: No     Comment: Used to drink beer, but not anymore.    Allergies: No Known Allergies  Current Medications: Current Outpatient Prescriptions  Medication Sig Dispense Refill  . allopurinol (ZYLOPRIM) 300 MG tablet Take 1 tablet (300 mg total) by mouth daily.  30 tablet  3  . carvedilol (COREG) 6.25 MG tablet Take 1 tablet (6.25 mg total) by mouth 2 (two) times daily with a meal.  30 tablet  6  . digoxin (LANOXIN) 0.125 MG tablet Take 0.125 mg by mouth every evening.       . docusate sodium (COLACE) 100 MG capsule Take 1 capsule (100 mg total) by mouth 2 (two) times daily.  30 capsule  0  . etoposide (VEPESID) 50 MG capsule Take 4 capsules (200 mg total) by mouth daily.  4 capsule  0  . ezetimibe-simvastatin (VYTORIN) 10-40 MG per tablet Take 1 tablet by mouth at bedtime.      . furosemide (LASIX) 20 MG tablet Take 1 tablet (20 mg total) by mouth daily.  30 tablet  6  . lidocaine-prilocaine (EMLA) cream Apply topically as needed.  30 g  2  . Multiple Vitamin (MULTIVITAMIN WITH MINERALS) TABS Take 1 tablet by mouth daily.      . ondansetron (ZOFRAN) 8 MG tablet Take 1 tablet (8 mg total) by mouth 2 (two) times daily. Take two times a day starting the day after chemo for 3 days. Then take two times a day as needed for nausea or vomiting.  30 tablet  1  . phenazopyridine (PYRIDIUM) 100 MG tablet Take 1 tablet (100 mg total) by mouth 3 (three) times daily as needed for pain (for burning).  20 tablet  0  . predniSONE (DELTASONE) 20 MG tablet Take 4 tablets (80 mg total) by mouth daily. Take on days 1-5 of chemotherapy.  20 tablet  6  . tamsulosin (FLOMAX) 0.4 MG CAPS Take 0.4 mg by mouth 1 day or 1 dose. bedtime      . warfarin (COUMADIN) 5 MG tablet Take 2.5-5 mg by mouth  daily. Take 5mg  daily except 2.5mg  on Tuesdays and Thursdays      . HYDROcodone-acetaminophen (NORCO/VICODIN) 5-325 MG per tablet Take 1-2 tablets by mouth every 6 (six) hours as needed for pain.  25 tablet  0  . LORazepam (ATIVAN) 0.5 MG tablet Take 1 tablet (0.5 mg total) by mouth every 6 (six) hours as needed (Nausea or vomiting).  30 tablet  0  . prochlorperazine (COMPAZINE) 10 MG tablet Take 10 mg by mouth every 6 (six) hours as needed (Nausea or vomiting).       No current facility-administered medications for this visit.     Review of  Systems: A 10 Point review of systems was completed and is negative except as noted above.  Gabriel Estrada denies any other symptomatology including fatigue, fever or chills, headache, vision changes, swollen glands, cough, chest pain or discomfort, nausea, vomiting, diarrhea, constipation, change in urinary or bowel habits, arthralgias/myalgias, unusual bleeding/bruising or any other symptomatology.   Physical Exam:   Blood pressure 145/64, pulse 79, temperature 96.9 F (36.1 C), temperature source Oral, resp. rate 18, height 6' (1.829 m), weight 164 lb (74.39 kg), SpO2 97.00%.  General appearance: Alert, cooperative, well nourished, no apparent distress Head: Normocephalic, without obvious abnormality, atraumatic, no dentition Eyes: Arcus senilis, PERRLA, EOMI Nose: Nares, septum and mucosa are normal, no drainage or sinus tenderness Neck: No adenopathy, supple, symmetrical, trachea midline, no tenderness Resp: Clear to auscultation bilaterally, no wheezes/rales/rhonchi, diminished breath sounds throughout (RUL/RLL > LUL/LLL) Cardio: Regular rate and rhythm, S1, S2 normal, no murmur, click, rub or gallop, no edema, right chest Port-A-Cath without signs of infection GI: Soft, distended, non-tender, normoactive bowel sounds, no organomegaly Skin: No rashes/lesions, skin warm and dry, no erythematous areas, no cyanosis, seborrheic keratosis on trunk and  extremities  M/S:  Atraumatic, normal strength in all extremities, normal range of motion, no clubbing  Lymph nodes: Cervical, supraclavicular, and axillary nodes normal Neurologic: Grossly normal, cranial nerves II through XII intact, alert and oriented x 3, patient ambulates with a cane Psych: Appropriate affect   ECOG Performance Status: ECOG FS: 1 - Symptomatic but completely ambulatory   Laboratory Data: Results for orders placed in visit on 09/09/13 (from the past 48 hour(s))  CBC WITH DIFFERENTIAL     Status: Abnormal   Collection Time    09/09/13  9:23 AM      Result Value Range   WBC 2.4 (*) 4.0 - 10.3 10e3/uL   NEUT# 1.8  1.5 - 6.5 10e3/uL   HGB 11.7 (*) 13.0 - 17.1 g/dL   HCT 16.1 (*) 09.6 - 04.5 %   Platelets 165  140 - 400 10e3/uL   MCV 86.8  79.3 - 98.0 fL   MCH 29.3  27.2 - 33.4 pg   MCHC 33.8  32.0 - 36.0 g/dL   RBC 4.09 (*) 8.11 - 9.14 10e6/uL   RDW 13.8  11.0 - 14.6 %   lymph# 0.5 (*) 0.9 - 3.3 10e3/uL   MONO# 0.1  0.1 - 0.9 10e3/uL   Eosinophils Absolute 0.1  0.0 - 0.5 10e3/uL   Basophils Absolute 0.1  0.0 - 0.1 10e3/uL   NEUT% 73.4  39.0 - 75.0 %   LYMPH% 18.9  14.0 - 49.0 %   MONO% 2.4  0.0 - 14.0 %   EOS% 2.8  0.0 - 7.0 %   BASO% 2.5 (*) 0.0 - 2.0 %  COMPREHENSIVE METABOLIC PANEL (CC13)     Status: Abnormal   Collection Time    09/09/13  9:23 AM      Result Value Range   Sodium 141  136 - 145 mEq/L   Potassium 4.9  3.5 - 5.1 mEq/L   Chloride 105  98 - 109 mEq/L   CO2 28  22 - 29 mEq/L   Glucose 91  70 - 140 mg/dl   BUN 78.2  7.0 - 95.6 mg/dL   Creatinine 1.5 (*) 0.7 - 1.3 mg/dL   Total Bilirubin 2.13  0.20 - 1.20 mg/dL   Alkaline Phosphatase 72  40 - 150 U/L   AST 24  5 - 34 U/L   ALT  34  0 - 55 U/L   Total Protein 6.7  6.4 - 8.3 g/dL   Albumin 3.4 (*) 3.5 - 5.0 g/dL   Calcium 9.5  8.4 - 16.1 mg/dL     Imaging Studies: No results found.   Assessment: 1.  Stage I diffuse large B-cell lymphoma (DLBCL) diagnosed in 11/2012.  2.   History of low-grade papillary urothelial carcinoma - status post TURBT of a 3 cm left lateral bladder tumor on 07/08/2013.  3.  Chronic Coumadin therapy - history of atrial fibrillation.  4.  Shortness of breath with exertion - history of COPD.   Plan: 1.  Gabriel Estrada will proceed with chemotherapy cycle #4 consisting of RCEOP (Rituxan/Cytoxan/Etoposide/Vincristine/Prednisone) today.  6 cycles of chemotherapy are planned and then restaging scans as appropriate will be decided by Dr. Bertis Estrada.  2.  Papillary urothelial carcinoma followed by Dr. Crecencio Mc with [facility].  3.  Coumadin therapy/INR monitoring being followed by the patient's primary care physician Dr. Caprice Red for atrial fibrillation.  4.  COPD is being managed by his primary care physician Dr. Caprice Red.  We plan to see Gabriel Estrada again on 09/23/2013 at which time he scheduled for assessment and laboratories of CBC and CMP prior to proceeding with chemotherapy cycle #5.  All questions answered.  Gabriel Estrada and his sister were encouraged to contact us in the interim with any questions, concerns, or problems.  Larina Bras, NP-C 09/10/2013, 1:34 PM

## 2013-09-12 ENCOUNTER — Ambulatory Visit (INDEPENDENT_AMBULATORY_CARE_PROVIDER_SITE_OTHER): Payer: Medicare Other | Admitting: Cardiology

## 2013-09-12 ENCOUNTER — Ambulatory Visit (INDEPENDENT_AMBULATORY_CARE_PROVIDER_SITE_OTHER): Payer: Medicare Other | Admitting: Pharmacist

## 2013-09-12 ENCOUNTER — Encounter: Payer: Self-pay | Admitting: Cardiology

## 2013-09-12 VITALS — BP 150/64 | HR 77 | Ht 72.0 in | Wt 161.4 lb

## 2013-09-12 DIAGNOSIS — Z7901 Long term (current) use of anticoagulants: Secondary | ICD-10-CM

## 2013-09-12 DIAGNOSIS — I4891 Unspecified atrial fibrillation: Secondary | ICD-10-CM

## 2013-09-12 DIAGNOSIS — I2510 Atherosclerotic heart disease of native coronary artery without angina pectoris: Secondary | ICD-10-CM

## 2013-09-12 LAB — POCT INR: INR: 1.6

## 2013-09-12 NOTE — Progress Notes (Signed)
HPI The patient returns for followup of his atrial fibrillation and CAD.  Since I last saw him he is being treated for nasal lymphoma.  He was also hospitalized in July and I reviewed these records. He did have some heart failure. The echo seemed to indicate however that his ejection fraction was fairly well-preserved. He did have some mild aortic stenosis.  Since that time he says he is doing well. He denies any chest pressure, neck or arm discomfort. He says his not having any shortness of breath, PND or orthopnea. He has no palpitations, presyncope or syncope. He walks with a cane and is a little weak but he still does his grocery shopping and all the cooking for his sister.     NKDA  Current Outpatient Prescriptions  Medication Sig Dispense Refill  . allopurinol (ZYLOPRIM) 300 MG tablet Take 1 tablet (300 mg total) by mouth daily.  30 tablet  3  . carvedilol (COREG) 6.25 MG tablet Take 1 tablet (6.25 mg total) by mouth 2 (two) times daily with a meal.  30 tablet  6  . digoxin (LANOXIN) 0.125 MG tablet Take 0.125 mg by mouth every evening.       . docusate sodium (COLACE) 100 MG capsule Take 1 capsule (100 mg total) by mouth 2 (two) times daily.  30 capsule  0  . etoposide (VEPESID) 50 MG capsule Take 4 capsules (200 mg total) by mouth daily.  4 capsule  0  . ezetimibe-simvastatin (VYTORIN) 10-40 MG per tablet Take 1 tablet by mouth at bedtime.      . furosemide (LASIX) 20 MG tablet Take 1 tablet (20 mg total) by mouth daily.  30 tablet  6  . HYDROcodone-acetaminophen (NORCO/VICODIN) 5-325 MG per tablet Take 1-2 tablets by mouth every 6 (six) hours as needed for pain.  25 tablet  0  . lidocaine-prilocaine (EMLA) cream Apply topically as needed.  30 g  2  . LORazepam (ATIVAN) 0.5 MG tablet Take 1 tablet (0.5 mg total) by mouth every 6 (six) hours as needed (Nausea or vomiting).  30 tablet  0  . Multiple Vitamin (MULTIVITAMIN WITH MINERALS) TABS Take 1 tablet by mouth daily.      . ondansetron  (ZOFRAN) 8 MG tablet Take 1 tablet (8 mg total) by mouth 2 (two) times daily. Take two times a day starting the day after chemo for 3 days. Then take two times a day as needed for nausea or vomiting.  30 tablet  1  . phenazopyridine (PYRIDIUM) 100 MG tablet Take 1 tablet (100 mg total) by mouth 3 (three) times daily as needed for pain (for burning).  20 tablet  0  . predniSONE (DELTASONE) 20 MG tablet Take 4 tablets (80 mg total) by mouth daily. Take on days 1-5 of chemotherapy.  20 tablet  6  . prochlorperazine (COMPAZINE) 10 MG tablet Take 10 mg by mouth every 6 (six) hours as needed (Nausea or vomiting).      . tamsulosin (FLOMAX) 0.4 MG CAPS Take 0.4 mg by mouth 1 day or 1 dose. bedtime      . warfarin (COUMADIN) 5 MG tablet Take 2.5-5 mg by mouth daily. Take 5mg  daily except 2.5mg  on Tuesdays and Thursdays       No current facility-administered medications for this visit.    Past Medical History  Diagnosis Date  . Coronary artery disease     a. 02/2001 MI/Cath/CABG x 5: LIMA->LAD, VG->D1, VG->OM2, VG->PDA->LPL & MV Ring (#30  Seguin ring annuloplasty)  . PVD (peripheral vascular disease)   . Hyperlipidemia   . Mitral regurgitation     a. 02/2001 s/p #30 Seguin ring annuloplasty.  . Chronic systolic CHF (congestive heart failure)     a. 05/2013 Echo: appears to be signif overall LV dysfxn, cannot estimate EF.  Ao sclerosis;  b. 06/2013 Echo: Poor windows, ? septal and apical HK, mild LVH, EF likely low normal  . Hypertension   . CKD (chronic kidney disease), stage III   . Teeth missing     all teeth pulled-06-13-13  . A-fib     a. chronic-Dr. Alexyss Balzarini,cardiology;  b. on coumadin.  [name] Lymphoma     Nasal Mass-left-Dr. Ha,oncology  . Bladder mass 07-03-13  . Carotid artery disease     a. 09/2012 U/S: 0-39% bilat ICA stenosis.    Past Surgical History  Procedure Laterality Date  . Coronary artery bypass graft  03/15/01    x 5  . Multiple extractions with alveoloplasty N/A 05/30/2013     Procedure: Extraction of tooth #'s 2,6,7,8,10,11,12,18,20,21,22,23,24,25,26,27,28,29 with alveoloplasty and mandibular tori reductions, and lateral exostoses reductions.;  Surgeon: Charlynne Pander, DDS;  Location: [facility] OR;  Service: Oral Surgery;  Laterality: N/A;  . Soft tissue biopsy       Diffuse B-Cell Lynphoma/ Nasal mucosa, biopsy, left,left nasal mass  . Portacath placement Right     right chest  . Cystoscopy w/ retrogrades Bilateral 07/08/2013    Procedure: CYSTOSCOPY WITH RETROGRADE PYELOGRAM;  Surgeon: Crecencio Mc, MD;  Location: [facility] ORS;  Service: Urology;  Laterality: Bilateral;  GENERAL ANESTHESIA WITH PARALYSIS, EXAM UNDER ANESTHESIA        ROS:  As stated in the HPI and negative for all other systems.  PHYSICAL EXAM BP 150/64  Pulse 77  Ht 6' (1.829 m)  Wt 161 lb 6.4 oz (73.211 kg)  BMI 21.89 kg/m2  SpO2 92% GENERAL:  Well appearing HEENT:  Pupils equal round and reactive, fundi not visualized, oral mucosa unremarkable NECK:  No jugular venous distention, waveform within normal limits, carotid upstroke brisk and symmetric, no bruits, no thyromegaly LUNGS:  Clear to auscultation bilaterally BACK:  No CVA tenderness CHEST:  Well healed sternotomy scar HEART:  PMI not displaced or sustained,S1 and S2 within normal limits, no S3, no S4, no clicks, no rubs, 2/6 apical systolic murmur radiating out the aortic outflow tract, no diastolic murmurs, irregular ABD:  Flat, positive bowel sounds normal in frequency in pitch, no bruits, no rebound, no guarding, no midline pulsatile mass, no hepatomegaly, no splenomegaly EXT:  2 plus pulses upper, absent DP/PT bilateral, bilateral femoral bruit, no edema, no cyanosis no clubbing    ASSESSMENT AND PLAN  CHF -  The patient appears to be euvolemic at this point.  I reviewed the hospital records. His EF appeared to be well controlled at that time. At this point no change in his therapy is indicated.  ATRIAL FIBRILLATION/FLUTTER-  The  patient tolerates this rhythm and rate control and anticoagulation. We will continue with the meds as listed.   CAD -  The patient has no new sypmtoms. No further cardiovascular testing is indicated. We will continue with aggressive risk reduction and meds as listed.   AORTIC SCLEROSIS  -  He had very mild plaque when checked recently. I will follow this up clinically.  CKD  He did have followup labs. His peak creatinine was about 2.69 but it was down to 1.5 checked recently. No change in  therapy is indicated.

## 2013-09-12 NOTE — Patient Instructions (Addendum)
The current medical regimen is effective;  continue present plan and medications.  Follow up in 6 months with Dr Hochrein.  You will receive a letter in the mail 2 months before you are due.  Please call us when you receive this letter to schedule your follow up appointment.  

## 2013-09-13 ENCOUNTER — Other Ambulatory Visit: Payer: Medicare Other

## 2013-09-16 ENCOUNTER — Other Ambulatory Visit (HOSPITAL_BASED_OUTPATIENT_CLINIC_OR_DEPARTMENT_OTHER): Payer: Medicare Other

## 2013-09-16 DIAGNOSIS — C8589 Other specified types of non-Hodgkin lymphoma, extranodal and solid organ sites: Secondary | ICD-10-CM

## 2013-09-16 DIAGNOSIS — C8330 Diffuse large B-cell lymphoma, unspecified site: Secondary | ICD-10-CM

## 2013-09-16 LAB — COMPREHENSIVE METABOLIC PANEL (CC13)
ALT: 21 U/L (ref 0–55)
AST: 20 U/L (ref 5–34)
CO2: 25 mEq/L (ref 22–29)
Calcium: 9 mg/dL (ref 8.4–10.4)
Chloride: 105 mEq/L (ref 98–109)
Sodium: 139 mEq/L (ref 136–145)
Total Protein: 6.9 g/dL (ref 6.4–8.3)

## 2013-09-16 LAB — CBC WITH DIFFERENTIAL/PLATELET
BASO%: 3.8 % — ABNORMAL HIGH (ref 0.0–2.0)
EOS%: 2.9 % (ref 0.0–7.0)
MCHC: 34 g/dL (ref 32.0–36.0)
MONO#: 0.5 10*3/uL (ref 0.1–0.9)
RBC: 3.82 10*6/uL — ABNORMAL LOW (ref 4.20–5.82)
RDW: 14.1 % (ref 11.0–14.6)
WBC: 1.4 10*3/uL — ABNORMAL LOW (ref 4.0–10.3)
lymph#: 0.5 10*3/uL — ABNORMAL LOW (ref 0.9–3.3)

## 2013-09-20 ENCOUNTER — Other Ambulatory Visit: Payer: Medicare Other | Admitting: Lab

## 2013-09-20 ENCOUNTER — Ambulatory Visit: Payer: Medicare Other

## 2013-09-23 ENCOUNTER — Ambulatory Visit (HOSPITAL_BASED_OUTPATIENT_CLINIC_OR_DEPARTMENT_OTHER): Payer: Medicare Other | Admitting: Hematology and Oncology

## 2013-09-23 ENCOUNTER — Ambulatory Visit (HOSPITAL_BASED_OUTPATIENT_CLINIC_OR_DEPARTMENT_OTHER): Payer: Medicare Other

## 2013-09-23 ENCOUNTER — Telehealth: Payer: Self-pay | Admitting: Hematology and Oncology

## 2013-09-23 ENCOUNTER — Other Ambulatory Visit (HOSPITAL_BASED_OUTPATIENT_CLINIC_OR_DEPARTMENT_OTHER): Payer: Medicare Other | Admitting: Lab

## 2013-09-23 ENCOUNTER — Encounter: Payer: Self-pay | Admitting: Hematology and Oncology

## 2013-09-23 ENCOUNTER — Encounter: Payer: Self-pay | Admitting: *Deleted

## 2013-09-23 ENCOUNTER — Other Ambulatory Visit: Payer: Medicare Other | Admitting: Lab

## 2013-09-23 VITALS — BP 122/67 | HR 80 | Temp 97.4°F | Resp 20 | Ht 72.0 in | Wt 162.4 lb

## 2013-09-23 VITALS — BP 115/68 | HR 69 | Temp 97.0°F | Resp 18

## 2013-09-23 DIAGNOSIS — C8589 Other specified types of non-Hodgkin lymphoma, extranodal and solid organ sites: Secondary | ICD-10-CM

## 2013-09-23 DIAGNOSIS — C8330 Diffuse large B-cell lymphoma, unspecified site: Secondary | ICD-10-CM

## 2013-09-23 DIAGNOSIS — Z5112 Encounter for antineoplastic immunotherapy: Secondary | ICD-10-CM

## 2013-09-23 DIAGNOSIS — I4891 Unspecified atrial fibrillation: Secondary | ICD-10-CM

## 2013-09-23 DIAGNOSIS — Z23 Encounter for immunization: Secondary | ICD-10-CM

## 2013-09-23 DIAGNOSIS — N189 Chronic kidney disease, unspecified: Secondary | ICD-10-CM

## 2013-09-23 DIAGNOSIS — D631 Anemia in chronic kidney disease: Secondary | ICD-10-CM

## 2013-09-23 DIAGNOSIS — Z5111 Encounter for antineoplastic chemotherapy: Secondary | ICD-10-CM

## 2013-09-23 LAB — CBC WITH DIFFERENTIAL/PLATELET
BASO%: 2.5 % — ABNORMAL HIGH (ref 0.0–2.0)
EOS%: 2.1 % (ref 0.0–7.0)
LYMPH%: 13.6 % — ABNORMAL LOW (ref 14.0–49.0)
MCH: 29.4 pg (ref 27.2–33.4)
MCHC: 33.9 g/dL (ref 32.0–36.0)
MONO#: 0.8 10*3/uL (ref 0.1–0.9)
Platelets: 201 10*3/uL (ref 140–400)
RBC: 3.88 10*6/uL — ABNORMAL LOW (ref 4.20–5.82)
WBC: 4.6 10*3/uL (ref 4.0–10.3)
lymph#: 0.6 10*3/uL — ABNORMAL LOW (ref 0.9–3.3)

## 2013-09-23 LAB — COMPREHENSIVE METABOLIC PANEL (CC13)
ALT: 15 U/L (ref 0–55)
AST: 18 U/L (ref 5–34)
Albumin: 3.2 g/dL — ABNORMAL LOW (ref 3.5–5.0)
BUN: 24.7 mg/dL (ref 7.0–26.0)
Calcium: 9.4 mg/dL (ref 8.4–10.4)
Chloride: 107 mEq/L (ref 98–109)
Glucose: 132 mg/dl (ref 70–140)
Potassium: 4.2 mEq/L (ref 3.5–5.1)

## 2013-09-23 MED ORDER — ONDANSETRON 16 MG/50ML IVPB (CHCC)
INTRAVENOUS | Status: AC
  Filled 2013-09-23: qty 16

## 2013-09-23 MED ORDER — DEXAMETHASONE SODIUM PHOSPHATE 20 MG/5ML IJ SOLN
20.0000 mg | Freq: Once | INTRAMUSCULAR | Status: AC
  Administered 2013-09-23: 20 mg via INTRAVENOUS

## 2013-09-23 MED ORDER — SODIUM CHLORIDE 0.9 % IJ SOLN
10.0000 mL | INTRAMUSCULAR | Status: DC | PRN
  Administered 2013-09-23: 10 mL
  Filled 2013-09-23: qty 10

## 2013-09-23 MED ORDER — VINCRISTINE SULFATE CHEMO INJECTION 1 MG/ML
2.0000 mg | Freq: Once | INTRAVENOUS | Status: AC
  Administered 2013-09-23: 2 mg via INTRAVENOUS
  Filled 2013-09-23: qty 2

## 2013-09-23 MED ORDER — SODIUM CHLORIDE 0.9 % IV SOLN
Freq: Once | INTRAVENOUS | Status: AC
  Administered 2013-09-23: 09:00:00 via INTRAVENOUS

## 2013-09-23 MED ORDER — DIPHENHYDRAMINE HCL 25 MG PO CAPS
ORAL_CAPSULE | ORAL | Status: AC
  Filled 2013-09-23: qty 2

## 2013-09-23 MED ORDER — ACETAMINOPHEN 325 MG PO TABS
650.0000 mg | ORAL_TABLET | Freq: Once | ORAL | Status: AC
  Administered 2013-09-23: 650 mg via ORAL

## 2013-09-23 MED ORDER — ONDANSETRON 16 MG/50ML IVPB (CHCC)
16.0000 mg | Freq: Once | INTRAVENOUS | Status: AC
  Administered 2013-09-23: 16 mg via INTRAVENOUS

## 2013-09-23 MED ORDER — ACETAMINOPHEN 325 MG PO TABS
ORAL_TABLET | ORAL | Status: AC
  Filled 2013-09-23: qty 2

## 2013-09-23 MED ORDER — SODIUM CHLORIDE 0.9 % IV SOLN
750.0000 mg/m2 | Freq: Once | INTRAVENOUS | Status: AC
  Administered 2013-09-23: 1460 mg via INTRAVENOUS
  Filled 2013-09-23: qty 73

## 2013-09-23 MED ORDER — INFLUENZA VAC SPLIT QUAD 0.5 ML IM SUSP
0.5000 mL | INTRAMUSCULAR | Status: AC
  Administered 2013-09-23: 0.5 mL via INTRAMUSCULAR
  Filled 2013-09-23: qty 0.5

## 2013-09-23 MED ORDER — SODIUM CHLORIDE 0.9 % IV SOLN
50.0000 mg/m2 | Freq: Once | INTRAVENOUS | Status: AC
  Administered 2013-09-23: 100 mg via INTRAVENOUS
  Filled 2013-09-23: qty 5

## 2013-09-23 MED ORDER — HEPARIN SOD (PORK) LOCK FLUSH 100 UNIT/ML IV SOLN
500.0000 [IU] | Freq: Once | INTRAVENOUS | Status: AC | PRN
  Administered 2013-09-23: 500 [IU]
  Filled 2013-09-23: qty 5

## 2013-09-23 MED ORDER — DEXAMETHASONE SODIUM PHOSPHATE 20 MG/5ML IJ SOLN
INTRAMUSCULAR | Status: AC
  Filled 2013-09-23: qty 5

## 2013-09-23 MED ORDER — SODIUM CHLORIDE 0.9 % IV SOLN
375.0000 mg/m2 | Freq: Once | INTRAVENOUS | Status: AC
  Administered 2013-09-23: 700 mg via INTRAVENOUS
  Filled 2013-09-23: qty 70

## 2013-09-23 MED ORDER — DIPHENHYDRAMINE HCL 25 MG PO CAPS
50.0000 mg | ORAL_CAPSULE | Freq: Once | ORAL | Status: AC
  Administered 2013-09-23: 50 mg via ORAL

## 2013-09-23 NOTE — Telephone Encounter (Signed)
gv and printed appt sched and avs for pt for Sept and OCT °

## 2013-09-23 NOTE — Progress Notes (Signed)
[facility] OFFICE PROGRESS NOTE  [name], MD  DIAGNOSIS: DLBCL (diffuse large B cell lymphoma)  SUMMARY OF ONCOLOGIC HISTORY: 1.  The patient had a left nasal mucosa biopsy and left nasal mass soft tissue biopsy on 12/12/2012 which showed large B-cell lymphoma-non-Hodgkin's lymphoma, diffuse large cell type, high grade.  2.  PET scan on 06/05/2013 showed a 4.1 x 1.7 cm left nasal mass, corresponding to known lymphoma, focal patchy/nodular opacity in posterior left upper lobe favored to be infectious/inflammatory, 2.6 x 1.1 cm mass along left posterior lateral bladder wall, suspicious for a primary bladder neoplasm.  2.  Status post transurethral bladder resection of 3 cm bladder tumor with bilateral retrograde pyelography on 07/08/2013 for a low-grade papillary urothelial carcinoma.   3.  The patient is currently receiving chemotherapy consisting of RCEOP (Rituxan/Cytoxan/Etoposide/Vincristine/Prednisone)  INTERVAL HISTORY: Gabriel Estrada 79 y.o. male returns for followup, prior to cycle 5 of chemotherapy. Is doing very well with no side effects from treatment whatsoever. Specifically, he denies any fever, chills, mouth sores, nausea, diarrhea, no recent cough. He denies any peripheral neuropathy. No recent bleeding such as epistaxis hematuria or hematochezia.   I have reviewed the past medical history, past surgical history, social history and family history with the patient and they are unchanged from previous note.  ALLERGIES:  has No Known Allergies.  MEDICATIONS: Current outpatient prescriptions:allopurinol (ZYLOPRIM) 300 MG tablet, Take 1 tablet (300 mg total) by mouth daily., Disp: 30 tablet, Rfl: 3;  carvedilol (COREG) 6.25 MG tablet, Take 1 tablet (6.25 mg total) by mouth 2 (two) times daily with a meal., Disp: 30 tablet, Rfl: 6;  digoxin (LANOXIN) 0.125 MG tablet, Take 0.125 mg by mouth every evening. , Disp: , Rfl:  docusate sodium (COLACE) 100 MG capsule,  Take 1 capsule (100 mg total) by mouth 2 (two) times daily., Disp: 30 capsule, Rfl: 0;  etoposide (VEPESID) 50 MG capsule, Take 4 capsules (200 mg total) by mouth daily., Disp: 4 capsule, Rfl: 0;  ezetimibe-simvastatin (VYTORIN) 10-40 MG per tablet, Take 1 tablet by mouth at bedtime., Disp: , Rfl: ;  furosemide (LASIX) 20 MG tablet, Take 1 tablet (20 mg total) by mouth daily., Disp: 30 tablet, Rfl: 6 HYDROcodone-acetaminophen (NORCO/VICODIN) 5-325 MG per tablet, Take 1-2 tablets by mouth every 6 (six) hours as needed for pain., Disp: 25 tablet, Rfl: 0;  lidocaine-prilocaine (EMLA) cream, Apply topically as needed., Disp: 30 g, Rfl: 2;  LORazepam (ATIVAN) 0.5 MG tablet, Take 1 tablet (0.5 mg total) by mouth every 6 (six) hours as needed (Nausea or vomiting)., Disp: 30 tablet, Rfl: 0 Multiple Vitamin (MULTIVITAMIN WITH MINERALS) TABS, Take 1 tablet by mouth daily., Disp: , Rfl: ;  ondansetron (ZOFRAN) 8 MG tablet, Take 1 tablet (8 mg total) by mouth 2 (two) times daily. Take two times a day starting the day after chemo for 3 days. Then take two times a day as needed for nausea or vomiting., Disp: 30 tablet, Rfl: 1 phenazopyridine (PYRIDIUM) 100 MG tablet, Take 1 tablet (100 mg total) by mouth 3 (three) times daily as needed for pain (for burning)., Disp: 20 tablet, Rfl: 0;  predniSONE (DELTASONE) 20 MG tablet, Take 4 tablets (80 mg total) by mouth daily. Take on days 1-5 of chemotherapy., Disp: 20 tablet, Rfl: 6;  prochlorperazine (COMPAZINE) 10 MG tablet, Take 10 mg by mouth every 6 (six) hours as needed (Nausea or vomiting)., Disp: , Rfl:  tamsulosin (FLOMAX) 0.4 MG CAPS, Take 0.4 mg by mouth 1 day  or 1 dose. bedtime, Disp: , Rfl: ;  warfarin (COUMADIN) 5 MG tablet, Take 2.5-5 mg by mouth daily. Take 5mg  daily except 2.5mg  on Tuesdays and Thursdays, Disp: , Rfl:  Current facility-administered medications:[START ON 09/24/2013] influenza vac split quadrivalent PF (FLUARIX) injection 0.5 mL, 0.5 mL, Intramuscular,  Tomorrow-1000, Artis Delay, MD Facility-Administered Medications Ordered in Other Visits: acetaminophen (TYLENOL) tablet 650 mg, 650 mg, Oral, Once, Artis Delay, MD;  cyclophosphamide (CYTOXAN) 1,460 mg in sodium chloride 0.9 % 250 mL chemo infusion, 750 mg/m2 (Treatment Plan Actual), Intravenous, Once, Artis Delay, MD;  diphenhydrAMINE (BENADRYL) capsule 50 mg, 50 mg, Oral, Once, Artis Delay, MD etoposide (VEPESID) 100 mg in sodium chloride 0.9 % 250 mL chemo infusion, 50 mg/m2 (Treatment Plan Actual), Intravenous, Once, Artis Delay, MD;  riTUXimab (RITUXAN) 700 mg in sodium chloride 0.9 % 180 mL chemo infusion, 375 mg/m2 (Treatment Plan Actual), Intravenous, Once, Artis Delay, MD;  vinCRIStine (ONCOVIN) 2 mg in sodium chloride 0.9 % 50 mL chemo infusion, 2 mg, Intravenous, Once, Artis Delay, MD  REVIEW OF SYSTEMS:   Constitutional: Denies fevers, chills or abnormal weight loss Eyes: Denies blurriness of vision Ears, nose, mouth, throat, and face: Denies mucositis or sore throat Respiratory: Denies cough, dyspnea or wheezes Cardiovascular: Denies palpitation, chest discomfort or lower extremity swelling Gastrointestinal:  Denies nausea, heartburn or change in bowel habits Skin: Denies abnormal skin rashes Lymphatics: Denies new lymphadenopathy or easy bruising Neurological:Denies numbness, tingling or new weaknesses Behavioral/Psych: Mood is stable, no new changes  All other systems were reviewed with the patient and are negative.  PHYSICAL EXAMINATION: ECOG PERFORMANCE STATUS: 0 - Asymptomatic  Filed Vitals:   09/23/13 0816  BP: 122/67  Pulse: 80  Temp: 97.4 F (36.3 C)  Resp: 20   Filed Weights   09/23/13 0816  Weight: 162 lb 6.4 oz (73.664 kg)    GENERAL:alert, no distress and comfortable SKIN: skin color, texture, turgor are normal, no rashes or significant lesions EYES: normal, Conjunctiva are pink and non-injected, sclera clear OROPHARYNX:no exudate, no erythema and lips, buccal  mucosa, and tongue normal  NECK: supple, thyroid normal size, non-tender, without nodularity LYMPH:  no palpable lymphadenopathy in the cervical, axillary or inguinal LUNGS: clear to auscultation and percussion with normal breathing effort HEART: regular rate & rhythm and no murmurs and no lower extremity edema ABDOMEN:abdomen soft, non-tender and normal bowel sounds Musculoskeletal:no cyanosis of digits and no clubbing  NEURO: alert & oriented x 3 with fluent speech, no focal motor/sensory deficits  LABORATORY DATA:  I have reviewed the data as listed    Component Value Date/Time   NA 142 09/23/2013 0751   NA 139 08/02/2013 1621   K 4.2 09/23/2013 0751   K 4.5 08/02/2013 1621   CL 100 08/02/2013 1621   CL 104 05/29/2013 1328   CO2 26 09/23/2013 0751   CO2 29 08/02/2013 1621   GLUCOSE 132 09/23/2013 0751   GLUCOSE 86 08/02/2013 1621   GLUCOSE 98 05/29/2013 1328   BUN 24.7 09/23/2013 0751   BUN 40* 08/02/2013 1621   CREATININE 1.4* 09/23/2013 0751   CREATININE 1.92* 08/02/2013 1621   CREATININE 2.2* 07/29/2013 0852   CALCIUM 9.4 09/23/2013 0751   CALCIUM 9.5 08/02/2013 1621   PROT 6.6 09/23/2013 0751   PROT 6.3 07/22/2013 1250   ALBUMIN 3.2* 09/23/2013 0751   ALBUMIN 3.3* 07/22/2013 1250   AST 18 09/23/2013 0751   AST 32 07/22/2013 1250   ALT 15 09/23/2013 0751   ALT 37  07/22/2013 1250   ALKPHOS 85 09/23/2013 0751   ALKPHOS 69 07/22/2013 1250   BILITOT 0.77 09/23/2013 0751   BILITOT 1.1 07/22/2013 1250   GFRNONAA 29* 07/26/2013 0607   GFRAA 34* 07/26/2013 0607    I No results found for this basename: SPEP, UPEP,  kappa and lambda light chains    Lab Results  Component Value Date   WBC 4.6 09/23/2013   NEUTROABS 3.0 09/23/2013   HGB 11.4* 09/23/2013   HCT 33.6* 09/23/2013   MCV 86.8 09/23/2013   PLT 201 09/23/2013      Chemistry      Component Value Date/Time   NA 142 09/23/2013 0751   NA 139 08/02/2013 1621   K 4.2 09/23/2013 0751   K 4.5 08/02/2013 1621   CL 100 08/02/2013 1621   CL 104 05/29/2013 1328    CO2 26 09/23/2013 0751   CO2 29 08/02/2013 1621   BUN 24.7 09/23/2013 0751   BUN 40* 08/02/2013 1621   CREATININE 1.4* 09/23/2013 0751   CREATININE 1.92* 08/02/2013 1621   CREATININE 2.2* 07/29/2013 0852      Component Value Date/Time   CALCIUM 9.4 09/23/2013 0751   CALCIUM 9.5 08/02/2013 1621   ALKPHOS 85 09/23/2013 0751   ALKPHOS 69 07/22/2013 1250   AST 18 09/23/2013 0751   AST 32 07/22/2013 1250   ALT 15 09/23/2013 0751   ALT 37 07/22/2013 1250   BILITOT 0.77 09/23/2013 0751   BILITOT 1.1 07/22/2013 1250      ASSESSMENT: Diffuse large B cell lymphoma, ongoing chemotherapy  PLAN:  #1 diffuse large B-cell lymphoma Is doing very well with minimal side effects from his treatment. My plan would be to complete 6 cycles of chemotherapy followed by imaging study. If the scans look completely normal, we would discontinue treatment. #2 mild anemia This is related to side effects of chemotherapy. He is asymptomatic. He denies any recent bleeding. We'll continue monitor his blood count carefully. He does not a transfusion or adjustment of the dosage of his chemotherapy #3 chronic kidney disease His creatinine function is stable. We'll monitor carefully while he is on treatment. #4 atrial fibrillation He is on anticoagulation therapy. He has no evidence of bleeding recently. Will continue anticoagulation management as directed by his primary care provider. #5 preventive care The patient desired to get influenza vaccination. Will administer in our clinic today.  All questions were answered. The patient knows to call the clinic with any problems, questions or concerns. We can certainly see the patient much sooner if necessary. No barriers to learning was detected. I spent 25 minutes counseling the patient face to face. The total time spent in the appointment was 40 minutes and more than 50% was on counseling and review of test results     [facility], Mandeep Kiser, MD 09/23/2013 9:42 AM

## 2013-09-23 NOTE — Progress Notes (Signed)
Met pt during scheduled infusion to provide continuing support.  Pt did not express any concerns.  Will continue to navigate as L2 (established) pt.  Young Berry, RN, BSN, [facility] Navigator [phone]

## 2013-09-23 NOTE — Patient Instructions (Addendum)
[facility] Discharge Instructions for Patients Receiving Chemotherapy  Today you received the following chemotherapy agents: Cytoxan, Etoposide, Vincristine, Rituxan. To help prevent nausea and vomiting after your treatment, we encourage you to take your nausea medication.   If you develop nausea and vomiting that is not controlled by your nausea medication, call the clinic.   BELOW ARE SYMPTOMS THAT SHOULD BE REPORTED IMMEDIATELY:  *FEVER GREATER THAN 100.5 F  *CHILLS WITH OR WITHOUT FEVER  NAUSEA AND VOMITING THAT IS NOT CONTROLLED WITH YOUR NAUSEA MEDICATION  *UNUSUAL SHORTNESS OF BREATH  *UNUSUAL BRUISING OR BLEEDING  TENDERNESS IN MOUTH AND THROAT WITH OR WITHOUT PRESENCE OF ULCERS  *URINARY PROBLEMS  *BOWEL PROBLEMS  UNUSUAL RASH Items with * indicate a potential emergency and should be followed up as soon as possible.  Feel free to call the clinic you have any questions or concerns. The clinic phone number is [phone].

## 2013-09-24 ENCOUNTER — Ambulatory Visit (HOSPITAL_BASED_OUTPATIENT_CLINIC_OR_DEPARTMENT_OTHER): Payer: Medicare Other

## 2013-09-24 ENCOUNTER — Other Ambulatory Visit: Payer: Self-pay | Admitting: Hematology and Oncology

## 2013-09-24 VITALS — BP 136/79 | HR 101 | Temp 97.4°F | Resp 18

## 2013-09-24 DIAGNOSIS — Z5111 Encounter for antineoplastic chemotherapy: Secondary | ICD-10-CM

## 2013-09-24 DIAGNOSIS — C8589 Other specified types of non-Hodgkin lymphoma, extranodal and solid organ sites: Secondary | ICD-10-CM

## 2013-09-24 DIAGNOSIS — C8330 Diffuse large B-cell lymphoma, unspecified site: Secondary | ICD-10-CM

## 2013-09-24 MED ORDER — DEXAMETHASONE SODIUM PHOSPHATE 10 MG/ML IJ SOLN
10.0000 mg | Freq: Once | INTRAMUSCULAR | Status: AC
  Administered 2013-09-24: 10 mg via INTRAVENOUS

## 2013-09-24 MED ORDER — HEPARIN SOD (PORK) LOCK FLUSH 100 UNIT/ML IV SOLN
500.0000 [IU] | Freq: Once | INTRAVENOUS | Status: AC | PRN
  Administered 2013-09-24: 500 [IU]
  Filled 2013-09-24: qty 5

## 2013-09-24 MED ORDER — SODIUM CHLORIDE 0.9 % IJ SOLN
10.0000 mL | INTRAMUSCULAR | Status: DC | PRN
  Administered 2013-09-24: 10 mL
  Filled 2013-09-24: qty 10

## 2013-09-24 MED ORDER — DEXAMETHASONE SODIUM PHOSPHATE 10 MG/ML IJ SOLN
INTRAMUSCULAR | Status: AC
  Filled 2013-09-24: qty 1

## 2013-09-24 MED ORDER — ONDANSETRON 8 MG/NS 50 ML IVPB
INTRAVENOUS | Status: AC
Start: 2013-09-24 — End: 2013-09-24
  Filled 2013-09-24: qty 8

## 2013-09-24 MED ORDER — SODIUM CHLORIDE 0.9 % IV SOLN
Freq: Once | INTRAVENOUS | Status: AC
  Administered 2013-09-24: 14:00:00 via INTRAVENOUS

## 2013-09-24 MED ORDER — SODIUM CHLORIDE 0.9 % IV SOLN
50.0000 mg/m2 | Freq: Once | INTRAVENOUS | Status: AC
  Administered 2013-09-24: 100 mg via INTRAVENOUS
  Filled 2013-09-24: qty 5

## 2013-09-24 MED ORDER — ONDANSETRON 8 MG/50ML IVPB (CHCC)
8.0000 mg | Freq: Once | INTRAVENOUS | Status: AC
  Administered 2013-09-24: 8 mg via INTRAVENOUS

## 2013-09-24 NOTE — Patient Instructions (Addendum)
[facility] Discharge Instructions for Patients Receiving Chemotherapy  Today you received the following chemotherapy agents:  Etoposide  To help prevent nausea and vomiting after your treatment, we encourage you to take your nausea medication as ordered per MD.   If you develop nausea and vomiting that is not controlled by your nausea medication, call the clinic.   BELOW ARE SYMPTOMS THAT SHOULD BE REPORTED IMMEDIATELY:  *FEVER GREATER THAN 100.5 F  *CHILLS WITH OR WITHOUT FEVER  NAUSEA AND VOMITING THAT IS NOT CONTROLLED WITH YOUR NAUSEA MEDICATION  *UNUSUAL SHORTNESS OF BREATH  *UNUSUAL BRUISING OR BLEEDING  TENDERNESS IN MOUTH AND THROAT WITH OR WITHOUT PRESENCE OF ULCERS  *URINARY PROBLEMS  *BOWEL PROBLEMS  UNUSUAL RASH Items with * indicate a potential emergency and should be followed up as soon as possible.  Feel free to call the clinic you have any questions or concerns. The clinic phone number is [phone].    

## 2013-09-25 ENCOUNTER — Ambulatory Visit (HOSPITAL_BASED_OUTPATIENT_CLINIC_OR_DEPARTMENT_OTHER): Payer: Medicare Other

## 2013-09-25 VITALS — BP 152/66 | HR 77 | Temp 97.0°F | Resp 20

## 2013-09-25 DIAGNOSIS — C8589 Other specified types of non-Hodgkin lymphoma, extranodal and solid organ sites: Secondary | ICD-10-CM

## 2013-09-25 DIAGNOSIS — Z5111 Encounter for antineoplastic chemotherapy: Secondary | ICD-10-CM

## 2013-09-25 DIAGNOSIS — C8330 Diffuse large B-cell lymphoma, unspecified site: Secondary | ICD-10-CM

## 2013-09-25 MED ORDER — DEXAMETHASONE SODIUM PHOSPHATE 10 MG/ML IJ SOLN
10.0000 mg | Freq: Once | INTRAMUSCULAR | Status: AC
  Administered 2013-09-25: 10 mg via INTRAVENOUS

## 2013-09-25 MED ORDER — SODIUM CHLORIDE 0.9 % IV SOLN
50.0000 mg/m2 | Freq: Once | INTRAVENOUS | Status: AC
  Administered 2013-09-25: 100 mg via INTRAVENOUS
  Filled 2013-09-25: qty 5

## 2013-09-25 MED ORDER — DEXAMETHASONE SODIUM PHOSPHATE 10 MG/ML IJ SOLN
INTRAMUSCULAR | Status: AC
  Filled 2013-09-25: qty 1

## 2013-09-25 MED ORDER — SODIUM CHLORIDE 0.9 % IV SOLN
Freq: Once | INTRAVENOUS | Status: AC
  Administered 2013-09-25: 14:00:00 via INTRAVENOUS

## 2013-09-25 MED ORDER — HEPARIN SOD (PORK) LOCK FLUSH 100 UNIT/ML IV SOLN
500.0000 [IU] | Freq: Once | INTRAVENOUS | Status: AC | PRN
  Administered 2013-09-25: 500 [IU]
  Filled 2013-09-25: qty 5

## 2013-09-25 MED ORDER — SODIUM CHLORIDE 0.9 % IJ SOLN
10.0000 mL | INTRAMUSCULAR | Status: DC | PRN
  Administered 2013-09-25: 10 mL
  Filled 2013-09-25: qty 10

## 2013-09-25 MED ORDER — ONDANSETRON 8 MG/50ML IVPB (CHCC)
8.0000 mg | Freq: Once | INTRAVENOUS | Status: AC
  Administered 2013-09-25: 8 mg via INTRAVENOUS

## 2013-09-25 MED ORDER — ONDANSETRON 8 MG/NS 50 ML IVPB
INTRAVENOUS | Status: AC
  Filled 2013-09-25: qty 8

## 2013-09-25 NOTE — Patient Instructions (Signed)
[facility] Discharge Instructions for Patients Receiving Chemotherapy  Today you received the following chemotherapy agents VP 16 (Etoposide) To help prevent nausea and vomiting after your treatment, we encourage you to take your nausea medication as prescribed.   If you develop nausea and vomiting that is not controlled by your nausea medication, call the clinic.   BELOW ARE SYMPTOMS THAT SHOULD BE REPORTED IMMEDIATELY:  *FEVER GREATER THAN 100.5 F  *CHILLS WITH OR WITHOUT FEVER  NAUSEA AND VOMITING THAT IS NOT CONTROLLED WITH YOUR NAUSEA MEDICATION  *UNUSUAL SHORTNESS OF BREATH  *UNUSUAL BRUISING OR BLEEDING  TENDERNESS IN MOUTH AND THROAT WITH OR WITHOUT PRESENCE OF ULCERS  *URINARY PROBLEMS  *BOWEL PROBLEMS  UNUSUAL RASH Items with * indicate a potential emergency and should be followed up as soon as possible.  Feel free to call the clinic you have any questions or concerns. The clinic phone number is [phone].    

## 2013-09-27 ENCOUNTER — Other Ambulatory Visit: Payer: Medicare Other

## 2013-09-30 ENCOUNTER — Other Ambulatory Visit (HOSPITAL_BASED_OUTPATIENT_CLINIC_OR_DEPARTMENT_OTHER): Payer: Medicare Other

## 2013-09-30 DIAGNOSIS — C8589 Other specified types of non-Hodgkin lymphoma, extranodal and solid organ sites: Secondary | ICD-10-CM

## 2013-09-30 DIAGNOSIS — C8330 Diffuse large B-cell lymphoma, unspecified site: Secondary | ICD-10-CM

## 2013-09-30 LAB — CBC WITH DIFFERENTIAL/PLATELET
Basophils Absolute: 0 10*3/uL (ref 0.0–0.1)
EOS%: 1.9 % (ref 0.0–7.0)
Eosinophils Absolute: 0.1 10*3/uL (ref 0.0–0.5)
HCT: 32.9 % — ABNORMAL LOW (ref 38.4–49.9)
HGB: 11 g/dL — ABNORMAL LOW (ref 13.0–17.1)
MCH: 28.9 pg (ref 27.2–33.4)
MCV: 86.4 fL (ref 79.3–98.0)
MONO#: 0 10*3/uL — ABNORMAL LOW (ref 0.1–0.9)
MONO%: 1.1 % (ref 0.0–14.0)
NEUT#: 3.1 10*3/uL (ref 1.5–6.5)
NEUT%: 85.3 % — ABNORMAL HIGH (ref 39.0–75.0)
RDW: 13.8 % (ref 11.0–14.6)
lymph#: 0.4 10*3/uL — ABNORMAL LOW (ref 0.9–3.3)

## 2013-09-30 LAB — COMPREHENSIVE METABOLIC PANEL (CC13)
ALT: 19 U/L (ref 0–55)
AST: 18 U/L (ref 5–34)
Albumin: 3.1 g/dL — ABNORMAL LOW (ref 3.5–5.0)
BUN: 29.6 mg/dL — ABNORMAL HIGH (ref 7.0–26.0)
CO2: 27 mEq/L (ref 22–29)
Calcium: 9 mg/dL (ref 8.4–10.4)
Chloride: 104 mEq/L (ref 98–109)
Creatinine: 1.2 mg/dL (ref 0.7–1.3)
Glucose: 79 mg/dl (ref 70–140)
Potassium: 4.4 mEq/L (ref 3.5–5.1)
Sodium: 140 mEq/L (ref 136–145)
Total Protein: 6.3 g/dL — ABNORMAL LOW (ref 6.4–8.3)

## 2013-09-30 LAB — TECHNOLOGIST REVIEW

## 2013-10-03 ENCOUNTER — Ambulatory Visit (INDEPENDENT_AMBULATORY_CARE_PROVIDER_SITE_OTHER): Payer: Medicare Other | Admitting: General Practice

## 2013-10-03 DIAGNOSIS — I4891 Unspecified atrial fibrillation: Secondary | ICD-10-CM

## 2013-10-03 DIAGNOSIS — Z7901 Long term (current) use of anticoagulants: Secondary | ICD-10-CM

## 2013-10-03 LAB — POCT INR: INR: 1.9

## 2013-10-04 ENCOUNTER — Telehealth: Payer: Self-pay | Admitting: Hematology and Oncology

## 2013-10-04 ENCOUNTER — Other Ambulatory Visit: Payer: Medicare Other

## 2013-10-04 ENCOUNTER — Other Ambulatory Visit: Payer: Self-pay | Admitting: Hematology and Oncology

## 2013-10-04 DIAGNOSIS — C8330 Diffuse large B-cell lymphoma, unspecified site: Secondary | ICD-10-CM

## 2013-10-04 NOTE — Telephone Encounter (Signed)
Per NG cx 10/13 lb - not needed. Have pt keep next appts for lb/fu/tx 10/20. S/w pt he is aware.

## 2013-10-07 ENCOUNTER — Other Ambulatory Visit: Payer: Medicare Other

## 2013-10-11 ENCOUNTER — Other Ambulatory Visit: Payer: Medicare Other | Admitting: Lab

## 2013-10-11 ENCOUNTER — Ambulatory Visit: Payer: Medicare Other

## 2013-10-14 ENCOUNTER — Ambulatory Visit (HOSPITAL_BASED_OUTPATIENT_CLINIC_OR_DEPARTMENT_OTHER): Payer: Medicare Other | Admitting: Hematology and Oncology

## 2013-10-14 ENCOUNTER — Other Ambulatory Visit (HOSPITAL_BASED_OUTPATIENT_CLINIC_OR_DEPARTMENT_OTHER): Payer: Medicare Other | Admitting: Lab

## 2013-10-14 ENCOUNTER — Other Ambulatory Visit: Payer: Self-pay | Admitting: Hematology and Oncology

## 2013-10-14 ENCOUNTER — Other Ambulatory Visit: Payer: Medicare Other | Admitting: Lab

## 2013-10-14 ENCOUNTER — Ambulatory Visit (HOSPITAL_BASED_OUTPATIENT_CLINIC_OR_DEPARTMENT_OTHER): Payer: Medicare Other

## 2013-10-14 ENCOUNTER — Encounter: Payer: Self-pay | Admitting: Hematology and Oncology

## 2013-10-14 VITALS — BP 116/61 | HR 74 | Temp 97.0°F | Resp 18 | Ht 72.0 in | Wt 258.6 lb

## 2013-10-14 VITALS — BP 118/63 | HR 68 | Temp 97.0°F | Resp 18 | Wt 158.0 lb

## 2013-10-14 DIAGNOSIS — C8589 Other specified types of non-Hodgkin lymphoma, extranodal and solid organ sites: Secondary | ICD-10-CM

## 2013-10-14 DIAGNOSIS — Z5111 Encounter for antineoplastic chemotherapy: Secondary | ICD-10-CM

## 2013-10-14 DIAGNOSIS — C679 Malignant neoplasm of bladder, unspecified: Secondary | ICD-10-CM

## 2013-10-14 DIAGNOSIS — N189 Chronic kidney disease, unspecified: Secondary | ICD-10-CM

## 2013-10-14 DIAGNOSIS — C8330 Diffuse large B-cell lymphoma, unspecified site: Secondary | ICD-10-CM

## 2013-10-14 DIAGNOSIS — I4891 Unspecified atrial fibrillation: Secondary | ICD-10-CM

## 2013-10-14 DIAGNOSIS — Z5112 Encounter for antineoplastic immunotherapy: Secondary | ICD-10-CM

## 2013-10-14 DIAGNOSIS — D649 Anemia, unspecified: Secondary | ICD-10-CM

## 2013-10-14 LAB — COMPREHENSIVE METABOLIC PANEL (CC13)
ALT: 13 U/L (ref 0–55)
AST: 19 U/L (ref 5–34)
Creatinine: 1.6 mg/dL — ABNORMAL HIGH (ref 0.7–1.3)
Total Bilirubin: 0.67 mg/dL (ref 0.20–1.20)

## 2013-10-14 LAB — CBC WITH DIFFERENTIAL/PLATELET
Basophils Absolute: 0 10*3/uL (ref 0.0–0.1)
EOS%: 1.1 % (ref 0.0–7.0)
Eosinophils Absolute: 0 10*3/uL (ref 0.0–0.5)
HCT: 31.6 % — ABNORMAL LOW (ref 38.4–49.9)
HGB: 10.7 g/dL — ABNORMAL LOW (ref 13.0–17.1)
MCH: 29.4 pg (ref 27.2–33.4)
MCV: 87 fL (ref 79.3–98.0)
MONO#: 0.7 10*3/uL (ref 0.1–0.9)
MONO%: 19.4 % — ABNORMAL HIGH (ref 0.0–14.0)
NEUT%: 65 % (ref 39.0–75.0)
RDW: 15.3 % — ABNORMAL HIGH (ref 11.0–14.6)
WBC: 3.6 10*3/uL — ABNORMAL LOW (ref 4.0–10.3)
lymph#: 0.5 10*3/uL — ABNORMAL LOW (ref 0.9–3.3)

## 2013-10-14 MED ORDER — SODIUM CHLORIDE 0.9 % IV SOLN
750.0000 mg/m2 | Freq: Once | INTRAVENOUS | Status: AC
  Administered 2013-10-14: 1460 mg via INTRAVENOUS
  Filled 2013-10-14: qty 73

## 2013-10-14 MED ORDER — ONDANSETRON 16 MG/50ML IVPB (CHCC)
16.0000 mg | Freq: Once | INTRAVENOUS | Status: AC
  Administered 2013-10-14: 16 mg via INTRAVENOUS

## 2013-10-14 MED ORDER — ACETAMINOPHEN 325 MG PO TABS
650.0000 mg | ORAL_TABLET | Freq: Once | ORAL | Status: AC
  Administered 2013-10-14: 650 mg via ORAL

## 2013-10-14 MED ORDER — DEXAMETHASONE SODIUM PHOSPHATE 20 MG/5ML IJ SOLN
INTRAMUSCULAR | Status: AC
  Filled 2013-10-14: qty 5

## 2013-10-14 MED ORDER — DEXAMETHASONE SODIUM PHOSPHATE 20 MG/5ML IJ SOLN
20.0000 mg | Freq: Once | INTRAMUSCULAR | Status: AC
  Administered 2013-10-14: 20 mg via INTRAVENOUS

## 2013-10-14 MED ORDER — HEPARIN SOD (PORK) LOCK FLUSH 100 UNIT/ML IV SOLN
500.0000 [IU] | Freq: Once | INTRAVENOUS | Status: AC | PRN
  Administered 2013-10-14: 500 [IU]
  Filled 2013-10-14: qty 5

## 2013-10-14 MED ORDER — DIPHENHYDRAMINE HCL 25 MG PO CAPS
ORAL_CAPSULE | ORAL | Status: AC
  Filled 2013-10-14: qty 2

## 2013-10-14 MED ORDER — RITUXIMAB CHEMO INJECTION 10 MG/ML
375.0000 mg/m2 | Freq: Once | INTRAVENOUS | Status: AC
  Administered 2013-10-14: 700 mg via INTRAVENOUS
  Filled 2013-10-14: qty 70

## 2013-10-14 MED ORDER — SODIUM CHLORIDE 0.9 % IV SOLN
50.0000 mg/m2 | Freq: Once | INTRAVENOUS | Status: AC
  Administered 2013-10-14: 100 mg via INTRAVENOUS
  Filled 2013-10-14: qty 5

## 2013-10-14 MED ORDER — SODIUM CHLORIDE 0.9 % IJ SOLN
10.0000 mL | INTRAMUSCULAR | Status: DC | PRN
  Administered 2013-10-14: 10 mL
  Filled 2013-10-14: qty 10

## 2013-10-14 MED ORDER — SODIUM CHLORIDE 0.9 % IV SOLN
Freq: Once | INTRAVENOUS | Status: AC
  Administered 2013-10-14: 09:00:00 via INTRAVENOUS

## 2013-10-14 MED ORDER — ACETAMINOPHEN 325 MG PO TABS
ORAL_TABLET | ORAL | Status: AC
  Filled 2013-10-14: qty 2

## 2013-10-14 MED ORDER — ONDANSETRON 16 MG/50ML IVPB (CHCC)
INTRAVENOUS | Status: AC
  Filled 2013-10-14: qty 16

## 2013-10-14 MED ORDER — DIPHENHYDRAMINE HCL 25 MG PO CAPS
50.0000 mg | ORAL_CAPSULE | Freq: Once | ORAL | Status: AC
  Administered 2013-10-14: 50 mg via ORAL

## 2013-10-14 MED ORDER — VINCRISTINE SULFATE CHEMO INJECTION 1 MG/ML
2.0000 mg | Freq: Once | INTRAVENOUS | Status: AC
  Administered 2013-10-14: 2 mg via INTRAVENOUS
  Filled 2013-10-14: qty 2

## 2013-10-14 NOTE — Patient Instructions (Signed)
[facility] Discharge Instructions for Patients Receiving Chemotherapy  Today you received the following chemotherapy agents; Vincristine, Cytoxan, Etoposide and Rituxan.  To help prevent nausea and vomiting after your treatment, we encourage you to take your nausea medication as directed.    If you develop nausea and vomiting that is not controlled by your nausea medication, call the clinic.   BELOW ARE SYMPTOMS THAT SHOULD BE REPORTED IMMEDIATELY:  *FEVER GREATER THAN 100.5 F  *CHILLS WITH OR WITHOUT FEVER  NAUSEA AND VOMITING THAT IS NOT CONTROLLED WITH YOUR NAUSEA MEDICATION  *UNUSUAL SHORTNESS OF BREATH  *UNUSUAL BRUISING OR BLEEDING  TENDERNESS IN MOUTH AND THROAT WITH OR WITHOUT PRESENCE OF ULCERS  *URINARY PROBLEMS  *BOWEL PROBLEMS  UNUSUAL RASH Items with * indicate a potential emergency and should be followed up as soon as possible.  Feel free to call the clinic you have any questions or concerns. The clinic phone number is [phone].

## 2013-10-14 NOTE — Progress Notes (Signed)
[facility] OFFICE PROGRESS NOTE  Patient Care Team: [name], MD as PCP - General (Cardiology) Keturah Barre, MD as Attending Physician (Otolaryngology) Exie Parody, MD (Hematology and Oncology) Lonie Peak, MD as Attending Physician (Radiation Oncology) Crecencio Mc, MD as Attending Physician (Urology) Dennis Bast, RN as Registered Nurse  DIAGNOSIS: Diffuse large B cell lymphoma, for further management  SUMMARY OF ONCOLOGIC HISTORY: 1.  The patient had a left nasal mucosa biopsy and left nasal mass soft tissue biopsy on 12/12/2012 which showed large B-cell lymphoma-non-Hodgkin's lymphoma, diffuse large cell type, high grade.  2.  PET scan on 06/05/2013 showed a 4.1 x 1.7 cm left nasal mass, corresponding to known lymphoma, focal patchy/nodular opacity in posterior left upper lobe favored to be infectious/inflammatory, 2.6 x 1.1 cm mass along left posterior lateral bladder wall, suspicious for a primary bladder neoplasm.  2.  Status post transurethral bladder resection of 3 cm bladder tumor with bilateral retrograde pyelography on 07/08/2013 for a low-grade papillary urothelial carcinoma.   3.  The patient is currently receiving chemotherapy consisting of RCEOP (Rituxan/Cytoxan/Etoposide/Vincristine/Prednisone)  INTERVAL HISTORY: Gabriel Estrada 79 y.o. male returns for further followup prior to his cycle 3 of chemotherapy. He is doing very well.He denies any recent fever, chills, night sweats or abnormal weight loss. The patient denies any mouth sores, nausea, vomiting or change in bowel habits. No bleeding complications from anticoagulation therapy such as spontaneous epistaxis, hematuria, or hematochezia.  I have reviewed the past medical history, past surgical history, social history and family history with the patient and they are unchanged from previous note.  ALLERGIES:  has No Known Allergies.  MEDICATIONS:  Current Outpatient Prescriptions   Medication Sig Dispense Refill  . carvedilol (COREG) 6.25 MG tablet Take 1 tablet (6.25 mg total) by mouth 2 (two) times daily with a meal.  30 tablet  6  . digoxin (LANOXIN) 0.125 MG tablet Take 0.125 mg by mouth every evening.       . docusate sodium (COLACE) 100 MG capsule Take 1 capsule (100 mg total) by mouth 2 (two) times daily.  30 capsule  0  . etoposide (VEPESID) 50 MG capsule Take 4 capsules (200 mg total) by mouth daily.  4 capsule  0  . ezetimibe-simvastatin (VYTORIN) 10-40 MG per tablet Take 1 tablet by mouth at bedtime.      . furosemide (LASIX) 20 MG tablet Take 1 tablet (20 mg total) by mouth daily.  30 tablet  6  . HYDROcodone-acetaminophen (NORCO/VICODIN) 5-325 MG per tablet Take 1-2 tablets by mouth every 6 (six) hours as needed for pain.  25 tablet  0  . lidocaine-prilocaine (EMLA) cream Apply topically as needed.  30 g  2  . LORazepam (ATIVAN) 0.5 MG tablet Take 1 tablet (0.5 mg total) by mouth every 6 (six) hours as needed (Nausea or vomiting).  30 tablet  0  . Multiple Vitamin (MULTIVITAMIN WITH MINERALS) TABS Take 1 tablet by mouth daily.      . ondansetron (ZOFRAN) 8 MG tablet Take 1 tablet (8 mg total) by mouth 2 (two) times daily. Take two times a day starting the day after chemo for 3 days. Then take two times a day as needed for nausea or vomiting.  30 tablet  1  . phenazopyridine (PYRIDIUM) 100 MG tablet Take 1 tablet (100 mg total) by mouth 3 (three) times daily as needed for pain (for burning).  20 tablet  0  . predniSONE (DELTASONE) 20 MG  tablet Take 4 tablets (80 mg total) by mouth daily. Take on days 1-5 of chemotherapy.  20 tablet  6  . prochlorperazine (COMPAZINE) 10 MG tablet Take 10 mg by mouth every 6 (six) hours as needed (Nausea or vomiting).      . tamsulosin (FLOMAX) 0.4 MG CAPS Take 0.4 mg by mouth 1 day or 1 dose. bedtime      . warfarin (COUMADIN) 5 MG tablet Take 2.5-5 mg by mouth daily. Take 5mg  daily except 2.5mg  on Tuesdays and Thursdays      .  allopurinol (ZYLOPRIM) 300 MG tablet Take 1 tablet (300 mg total) by mouth daily.  30 tablet  3   No current facility-administered medications for this visit.    REVIEW OF SYSTEMS:   Constitutional: Denies fevers, chills or abnormal weight loss Eyes: Denies blurriness of vision Ears, nose, mouth, throat, and face: Denies mucositis or sore throat Respiratory: Denies cough, dyspnea or wheezes Cardiovascular: Denies palpitation, chest discomfort or lower extremity swelling Gastrointestinal:  Denies nausea, heartburn or change in bowel habits Skin: Denies abnormal skin rashes Lymphatics: Denies new lymphadenopathy or easy bruising Neurological:Denies numbness, tingling or new weaknesses Behavioral/Psych: Mood is stable, no new changes  All other systems were reviewed with the patient and are negative.  PHYSICAL EXAMINATION: ECOG PERFORMANCE STATUS: 0 - Asymptomatic  Filed Vitals:   10/14/13 0823  BP: 116/61  Pulse: 74  Temp: 97 F (36.1 C)  Resp: 18   Filed Weights   10/14/13 0823  Weight: 258 lb 9.6 oz (117.3 kg)    GENERAL:alert, no distress and comfortable SKIN: skin color, texture, turgor are normal, no rashes or significant lesions no rashes are noted EYES: normal, Conjunctiva are pink and non-injected, sclera clear OROPHARYNX:no exudate, no erythema and lips, buccal mucosa, and tongue normal . Patient has no teeth. NECK: supple, thyroid normal size, non-tender, without nodularity LYMPH:  no palpable lymphadenopathy in the cervical, axillary or inguinal LUNGS: clear to auscultation and percussion with normal breathing effort HEART: regular rate & rhythm and no murmurs and no lower extremity edema ABDOMEN:abdomen soft, non-tender and normal bowel sounds Musculoskeletal:no cyanosis of digits and no clubbing  NEURO: alert & oriented x 3 with fluent speech, no focal motor/sensory deficits  LABORATORY DATA:  I have reviewed the data as listed    Component Value Date/Time    NA 140 09/30/2013 1306   NA 139 08/02/2013 1621   K 4.4 09/30/2013 1306   K 4.5 08/02/2013 1621   CL 100 08/02/2013 1621   CL 104 05/29/2013 1328   CO2 27 09/30/2013 1306   CO2 29 08/02/2013 1621   GLUCOSE 79 09/30/2013 1306   GLUCOSE 86 08/02/2013 1621   GLUCOSE 98 05/29/2013 1328   BUN 29.6* 09/30/2013 1306   BUN 40* 08/02/2013 1621   CREATININE 1.2 09/30/2013 1306   CREATININE 1.92* 08/02/2013 1621   CREATININE 2.2* 07/29/2013 0852   CALCIUM 9.0 09/30/2013 1306   CALCIUM 9.5 08/02/2013 1621   PROT 6.3* 09/30/2013 1306   PROT 6.3 07/22/2013 1250   ALBUMIN 3.1* 09/30/2013 1306   ALBUMIN 3.3* 07/22/2013 1250   AST 18 09/30/2013 1306   AST 32 07/22/2013 1250   ALT 19 09/30/2013 1306   ALT 37 07/22/2013 1250   ALKPHOS 69 09/30/2013 1306   ALKPHOS 69 07/22/2013 1250   BILITOT 0.77 09/30/2013 1306   BILITOT 1.1 07/22/2013 1250   GFRNONAA 29* 07/26/2013 0607   GFRAA 34* 07/26/2013 0607    No results  found for this basename: SPEP,  UPEP,   kappa and lambda light chains    Lab Results  Component Value Date   WBC 3.6* 10/14/2013   NEUTROABS 2.4 10/14/2013   HGB 10.7* 10/14/2013   HCT 31.6* 10/14/2013   MCV 87.0 10/14/2013   PLT 222 10/14/2013      Chemistry      Component Value Date/Time   NA 140 09/30/2013 1306   NA 139 08/02/2013 1621   K 4.4 09/30/2013 1306   K 4.5 08/02/2013 1621   CL 100 08/02/2013 1621   CL 104 05/29/2013 1328   CO2 27 09/30/2013 1306   CO2 29 08/02/2013 1621   BUN 29.6* 09/30/2013 1306   BUN 40* 08/02/2013 1621   CREATININE 1.2 09/30/2013 1306   CREATININE 1.92* 08/02/2013 1621   CREATININE 2.2* 07/29/2013 0852      Component Value Date/Time   CALCIUM 9.0 09/30/2013 1306   CALCIUM 9.5 08/02/2013 1621   ALKPHOS 69 09/30/2013 1306   ALKPHOS 69 07/22/2013 1250   AST 18 09/30/2013 1306   AST 32 07/22/2013 1250   ALT 19 09/30/2013 1306   ALT 37 07/22/2013 1250   BILITOT 0.77 09/30/2013 1306   BILITOT 1.1 07/22/2013 1250    ASSESSMENT:  Diffuse large B-cell lymphoma  PLAN:  #1 diffuse large B-cell  lymphoma, Stage 1 Is doing very well with minimal side effects from his treatment. My plan would be to complete 3 cycles of chemotherapy followed by imaging study. I have ordered a PET/CT scan to be done in a month's time If the scans look completely normal, we would discontinue treatment. We can consider XRT for consolidation therapy #2 mild anemia This is related to side effects of chemotherapy. He is asymptomatic. He denies any recent bleeding. We'll continue monitor his blood count carefully. He does not a transfusion or adjustment of the dosage of his chemotherapy #3 chronic kidney disease His creatinine function is stable. We'll monitor carefully while he is on treatment. #4 atrial fibrillation He is on anticoagulation therapy. He has no evidence of bleeding recently. Will continue anticoagulation management as directed by his primary care provider. #5 history of bladder cancer The patient had transurethral bladder resection and BCG treatment. We will observe.  Orders Placed This Encounter  Procedures  . NM PET Image Restag (PS) Skull Base To Thigh    Standing Status: Future     Number of Occurrences:      Standing Expiration Date: 10/14/2014    Order Specific Question:  Reason for exam:    Answer:  staging lymphoma after chemo, assess response    Order Specific Question:  Preferred imaging location?    Answer:  [facility]  . CBC with Differential    Standing Status: Future     Number of Occurrences:      Standing Expiration Date: 07/06/2014  . Comprehensive metabolic panel    Standing Status: Future     Number of Occurrences:      Standing Expiration Date: 10/14/2014  . Lactate dehydrogenase    Standing Status: Future     Number of Occurrences:      Standing Expiration Date: 10/14/2014   All questions were answered. The patient knows to call the clinic with any problems, questions or concerns. No barriers to learning was detected.    [facility], Alichia Alridge, MD 10/14/2013  8:36 AM

## 2013-10-15 ENCOUNTER — Encounter: Payer: Self-pay | Admitting: *Deleted

## 2013-10-15 ENCOUNTER — Other Ambulatory Visit: Payer: Self-pay | Admitting: Hematology and Oncology

## 2013-10-15 ENCOUNTER — Telehealth: Payer: Self-pay | Admitting: Hematology and Oncology

## 2013-10-15 ENCOUNTER — Ambulatory Visit (HOSPITAL_BASED_OUTPATIENT_CLINIC_OR_DEPARTMENT_OTHER): Payer: Medicare Other

## 2013-10-15 VITALS — BP 127/68 | HR 86 | Temp 97.1°F

## 2013-10-15 DIAGNOSIS — C8330 Diffuse large B-cell lymphoma, unspecified site: Secondary | ICD-10-CM

## 2013-10-15 DIAGNOSIS — Z5111 Encounter for antineoplastic chemotherapy: Secondary | ICD-10-CM

## 2013-10-15 DIAGNOSIS — C8589 Other specified types of non-Hodgkin lymphoma, extranodal and solid organ sites: Secondary | ICD-10-CM

## 2013-10-15 MED ORDER — DEXAMETHASONE SODIUM PHOSPHATE 10 MG/ML IJ SOLN
10.0000 mg | Freq: Once | INTRAMUSCULAR | Status: AC
  Administered 2013-10-15: 10 mg via INTRAVENOUS

## 2013-10-15 MED ORDER — ONDANSETRON 8 MG/50ML IVPB (CHCC)
8.0000 mg | Freq: Once | INTRAVENOUS | Status: AC
  Administered 2013-10-15: 8 mg via INTRAVENOUS

## 2013-10-15 MED ORDER — HEPARIN SOD (PORK) LOCK FLUSH 100 UNIT/ML IV SOLN
500.0000 [IU] | Freq: Once | INTRAVENOUS | Status: AC | PRN
  Administered 2013-10-15: 500 [IU]
  Filled 2013-10-15: qty 5

## 2013-10-15 MED ORDER — SODIUM CHLORIDE 0.9 % IV SOLN
Freq: Once | INTRAVENOUS | Status: AC
  Administered 2013-10-15: 14:00:00 via INTRAVENOUS

## 2013-10-15 MED ORDER — DEXAMETHASONE SODIUM PHOSPHATE 10 MG/ML IJ SOLN
INTRAMUSCULAR | Status: AC
  Filled 2013-10-15: qty 1

## 2013-10-15 MED ORDER — SODIUM CHLORIDE 0.9 % IJ SOLN
10.0000 mL | INTRAMUSCULAR | Status: DC | PRN
  Administered 2013-10-15: 10 mL
  Filled 2013-10-15: qty 10

## 2013-10-15 MED ORDER — SODIUM CHLORIDE 0.9 % IV SOLN
50.0000 mg/m2 | Freq: Once | INTRAVENOUS | Status: AC
  Administered 2013-10-15: 100 mg via INTRAVENOUS
  Filled 2013-10-15: qty 5

## 2013-10-15 MED ORDER — ONDANSETRON 8 MG/NS 50 ML IVPB
INTRAVENOUS | Status: AC
  Filled 2013-10-15: qty 8

## 2013-10-15 NOTE — Patient Instructions (Signed)
[facility] Discharge Instructions for Patients Receiving Chemotherapy  Today you received the following chemotherapy agents Etoposide.  To help prevent nausea and vomiting after your treatment, we encourage you to take your nausea medication.   If you develop nausea and vomiting that is not controlled by your nausea medication, call the clinic.   BELOW ARE SYMPTOMS THAT SHOULD BE REPORTED IMMEDIATELY:  *FEVER GREATER THAN 100.5 F  *CHILLS WITH OR WITHOUT FEVER  NAUSEA AND VOMITING THAT IS NOT CONTROLLED WITH YOUR NAUSEA MEDICATION  *UNUSUAL SHORTNESS OF BREATH  *UNUSUAL BRUISING OR BLEEDING  TENDERNESS IN MOUTH AND THROAT WITH OR WITHOUT PRESENCE OF ULCERS  *URINARY PROBLEMS  *BOWEL PROBLEMS  UNUSUAL RASH Items with * indicate a potential emergency and should be followed up as soon as possible.  Feel free to call the clinic you have any questions or concerns. The clinic phone number is [phone].    

## 2013-10-15 NOTE — Progress Notes (Signed)
Met with pt during scheduled infusion.  Pt stated that he is doing well, looking forward to final chemo tomorrow.  Continuing to navigate as L2 (treatments established) patient.  Young Berry, RN, BSN, [facility] Head & Neck Oncology Navigator [phone]

## 2013-10-15 NOTE — Telephone Encounter (Signed)
per Dr. Bertis Ruddy staff msg pts last tx are 10.21 and 10.22

## 2013-10-16 ENCOUNTER — Ambulatory Visit (HOSPITAL_BASED_OUTPATIENT_CLINIC_OR_DEPARTMENT_OTHER): Payer: Medicare Other

## 2013-10-16 ENCOUNTER — Encounter: Payer: Self-pay | Admitting: *Deleted

## 2013-10-16 VITALS — BP 129/52 | HR 74 | Temp 98.3°F | Resp 18

## 2013-10-16 DIAGNOSIS — Z5111 Encounter for antineoplastic chemotherapy: Secondary | ICD-10-CM

## 2013-10-16 DIAGNOSIS — C8589 Other specified types of non-Hodgkin lymphoma, extranodal and solid organ sites: Secondary | ICD-10-CM

## 2013-10-16 DIAGNOSIS — C8330 Diffuse large B-cell lymphoma, unspecified site: Secondary | ICD-10-CM

## 2013-10-16 MED ORDER — ONDANSETRON 8 MG/50ML IVPB (CHCC)
8.0000 mg | Freq: Once | INTRAVENOUS | Status: AC
  Administered 2013-10-16: 8 mg via INTRAVENOUS

## 2013-10-16 MED ORDER — HEPARIN SOD (PORK) LOCK FLUSH 100 UNIT/ML IV SOLN
500.0000 [IU] | Freq: Once | INTRAVENOUS | Status: AC | PRN
  Administered 2013-10-16: 500 [IU]
  Filled 2013-10-16: qty 5

## 2013-10-16 MED ORDER — SODIUM CHLORIDE 0.9 % IV SOLN
50.0000 mg/m2 | Freq: Once | INTRAVENOUS | Status: AC
  Administered 2013-10-16: 100 mg via INTRAVENOUS
  Filled 2013-10-16: qty 5

## 2013-10-16 MED ORDER — ONDANSETRON 8 MG/NS 50 ML IVPB
INTRAVENOUS | Status: AC
  Filled 2013-10-16: qty 8

## 2013-10-16 MED ORDER — SODIUM CHLORIDE 0.9 % IV SOLN
Freq: Once | INTRAVENOUS | Status: AC
  Administered 2013-10-16: 14:00:00 via INTRAVENOUS

## 2013-10-16 MED ORDER — DEXAMETHASONE SODIUM PHOSPHATE 10 MG/ML IJ SOLN
INTRAMUSCULAR | Status: AC
  Filled 2013-10-16: qty 1

## 2013-10-16 MED ORDER — SODIUM CHLORIDE 0.9 % IJ SOLN
10.0000 mL | INTRAMUSCULAR | Status: AC | PRN
  Administered 2013-10-16: 10 mL
  Filled 2013-10-16: qty 10

## 2013-10-16 MED ORDER — DEXAMETHASONE SODIUM PHOSPHATE 10 MG/ML IJ SOLN
10.0000 mg | Freq: Once | INTRAMUSCULAR | Status: AC
  Administered 2013-10-16: 10 mg via INTRAVENOUS

## 2013-10-16 NOTE — Progress Notes (Signed)
Met with patient and his sisters during his final infusion and later joined them for bell ringing ceremony.  Escorted them to valet; they again expressed appreciation for friendliness and support of all staff.  Will begin navigating as L3 (treatments completed) patient.  Young Berry, RN, BSN, [facility] Head & Neck Oncology Navigator [phone]

## 2013-10-16 NOTE — Progress Notes (Unsigned)
Patient reports taking 40mg  Prednisone prior to arrival for today's treatment. Angelena Form, RN

## 2013-10-16 NOTE — Patient Instructions (Signed)
[facility] Discharge Instructions for Patients Receiving Chemotherapy  Today you received the following chemotherapy agents Etoposide.  To help prevent nausea and vomiting after your treatment, we encourage you to take your nausea medication.   If you develop nausea and vomiting that is not controlled by your nausea medication, call the clinic.   BELOW ARE SYMPTOMS THAT SHOULD BE REPORTED IMMEDIATELY:  *FEVER GREATER THAN 100.5 F  *CHILLS WITH OR WITHOUT FEVER  NAUSEA AND VOMITING THAT IS NOT CONTROLLED WITH YOUR NAUSEA MEDICATION  *UNUSUAL SHORTNESS OF BREATH  *UNUSUAL BRUISING OR BLEEDING  TENDERNESS IN MOUTH AND THROAT WITH OR WITHOUT PRESENCE OF ULCERS  *URINARY PROBLEMS  *BOWEL PROBLEMS  UNUSUAL RASH Items with * indicate a potential emergency and should be followed up as soon as possible.  Feel free to call the clinic you have any questions or concerns. The clinic phone number is [phone].    

## 2013-10-18 ENCOUNTER — Other Ambulatory Visit: Payer: Self-pay | Admitting: Certified Registered Nurse Anesthetist

## 2013-10-18 ENCOUNTER — Other Ambulatory Visit: Payer: Medicare Other

## 2013-10-19 ENCOUNTER — Other Ambulatory Visit: Payer: Self-pay | Admitting: Nurse Practitioner

## 2013-10-21 ENCOUNTER — Other Ambulatory Visit: Payer: Medicare Other

## 2013-10-24 ENCOUNTER — Ambulatory Visit (INDEPENDENT_AMBULATORY_CARE_PROVIDER_SITE_OTHER): Payer: Medicare Other | Admitting: General Practice

## 2013-10-24 DIAGNOSIS — I4891 Unspecified atrial fibrillation: Secondary | ICD-10-CM

## 2013-10-24 DIAGNOSIS — Z7901 Long term (current) use of anticoagulants: Secondary | ICD-10-CM

## 2013-10-24 LAB — POCT INR: INR: 2.2

## 2013-10-25 ENCOUNTER — Other Ambulatory Visit: Payer: Medicare Other

## 2013-11-11 ENCOUNTER — Encounter (HOSPITAL_COMMUNITY)
Admission: RE | Admit: 2013-11-11 | Discharge: 2013-11-11 | Disposition: A | Discharge status: Home / Self Care | Payer: Medicare Other | Source: Ambulatory Visit | Attending: Hematology and Oncology | Admitting: Hematology and Oncology

## 2013-11-11 ENCOUNTER — Encounter (HOSPITAL_COMMUNITY): Payer: Self-pay | Admitting: Dentistry

## 2013-11-11 ENCOUNTER — Ambulatory Visit (HOSPITAL_COMMUNITY): Payer: Medicare Other | Admitting: Dentistry

## 2013-11-11 ENCOUNTER — Encounter (HOSPITAL_COMMUNITY): Payer: Self-pay

## 2013-11-11 VITALS — BP 150/81 | HR 75 | Temp 97.8°F

## 2013-11-11 DIAGNOSIS — Z09 Encounter for follow-up examination after completed treatment for conditions other than malignant neoplasm: Secondary | ICD-10-CM

## 2013-11-11 DIAGNOSIS — K08109 Complete loss of teeth, unspecified cause, unspecified class: Secondary | ICD-10-CM

## 2013-11-11 DIAGNOSIS — Z87898 Personal history of other specified conditions: Secondary | ICD-10-CM

## 2013-11-11 DIAGNOSIS — C8589 Other specified types of non-Hodgkin lymphoma, extranodal and solid organ sites: Secondary | ICD-10-CM

## 2013-11-11 DIAGNOSIS — Z9221 Personal history of antineoplastic chemotherapy: Secondary | ICD-10-CM

## 2013-11-11 LAB — GLUCOSE, CAPILLARY: Glucose-Capillary: 89 mg/dL (ref 70–99)

## 2013-11-11 MED ORDER — FLUDEOXYGLUCOSE F - 18 (FDG) INJECTION
17.6000 | Freq: Once | INTRAVENOUS | Status: AC | PRN
  Administered 2013-11-11: 17.6 via INTRAVENOUS

## 2013-11-11 NOTE — Progress Notes (Signed)
11/11/2013  Patient:            Gabriel Estrada Date of Birth:  Aug 19, 1928 MRN:                [id number]  BP 150/81  Pulse 75  Temp(Src) 97.8 F (36.6 C) (Oral)  Gabriel Estrada is an 79 year old male with a history of non-Hodgkin's lymphoma of the left nasal cavity. The patient underwent extraction of remaining teeth with alveoloplasty and pre-prosthetic surgery on 05/30/2013. The patient then underwent chemotherapy and completed chemotherapy on 10/16/2013. Patient is not going to be treated with radiation therapy per patient report. The patient now presents for periodic oral examination and evaluation for fabrication of upper and lower complete dentures.   Medical Hx Update:  Past Medical History  Diagnosis Date  . Coronary artery disease     a. 02/2001 MI/Cath/CABG x 5: LIMA->LAD, VG->D1, VG->OM2, VG->PDA->LPL & MV Ring (#30 Seguin ring annuloplasty)  . PVD (peripheral vascular disease)   . Hyperlipidemia   . Mitral regurgitation     a. 02/2001 s/p #30 Seguin ring annuloplasty.  . Chronic systolic CHF (congestive heart failure)     a. 05/2013 Echo: appears to be signif overall LV dysfxn, cannot estimate EF.  Ao sclerosis;  b. 06/2013 Echo: Poor windows, ? septal and apical HK, mild LVH, EF likely low normal  . Hypertension   . CKD (chronic kidney disease), stage III   . Teeth missing     all teeth pulled-06-13-13  . A-fib     a. chronic-Dr. Hochrein,cardiology;  b. on coumadin.  [name] Lymphoma     Nasal Mass-left-Dr. Ha,oncology  . Bladder mass 07-03-13  . Carotid artery disease     a. 09/2012 U/S: 0-39% bilat ICA stenosis.  .  ALLERGIES/ADVERSE DRUG REACTIONS: No Known Allergies  MEDICATIONS: Current Outpatient Prescriptions  Medication Sig Dispense Refill  . allopurinol (ZYLOPRIM) 300 MG tablet Take 1 tablet (300 mg total) by mouth daily.  30 tablet  3  . carvedilol (COREG) 6.25 MG tablet TAKE 1 TABLET BY MOUTH TWICE DAILY WITH MEALS  60 tablet  3  . digoxin (LANOXIN) 0.125 MG  tablet Take 0.125 mg by mouth every evening.       . docusate sodium (COLACE) 100 MG capsule Take 1 capsule (100 mg total) by mouth 2 (two) times daily.  30 capsule  0  . etoposide (VEPESID) 50 MG capsule Take 4 capsules (200 mg total) by mouth daily.  4 capsule  0  . ezetimibe-simvastatin (VYTORIN) 10-40 MG per tablet Take 1 tablet by mouth at bedtime.      . furosemide (LASIX) 20 MG tablet Take 1 tablet (20 mg total) by mouth daily.  30 tablet  6  . HYDROcodone-acetaminophen (NORCO/VICODIN) 5-325 MG per tablet Take 1-2 tablets by mouth every 6 (six) hours as needed for pain.  25 tablet  0  . lidocaine-prilocaine (EMLA) cream Apply topically as needed.  30 g  2  . LORazepam (ATIVAN) 0.5 MG tablet Take 1 tablet (0.5 mg total) by mouth every 6 (six) hours as needed (Nausea or vomiting).  30 tablet  0  . Multiple Vitamin (MULTIVITAMIN WITH MINERALS) TABS Take 1 tablet by mouth daily.      . ondansetron (ZOFRAN) 8 MG tablet Take 1 tablet (8 mg total) by mouth 2 (two) times daily. Take two times a day starting the day after chemo for 3 days. Then take two times a day as needed for nausea or  vomiting.  30 tablet  1  . phenazopyridine (PYRIDIUM) 100 MG tablet Take 1 tablet (100 mg total) by mouth 3 (three) times daily as needed for pain (for burning).  20 tablet  0  . predniSONE (DELTASONE) 20 MG tablet Take 4 tablets (80 mg total) by mouth daily. Take on days 1-5 of chemotherapy.  20 tablet  6  . prochlorperazine (COMPAZINE) 10 MG tablet Take 10 mg by mouth every 6 (six) hours as needed (Nausea or vomiting).      . tamsulosin (FLOMAX) 0.4 MG CAPS Take 0.4 mg by mouth 1 day or 1 dose. bedtime      . warfarin (COUMADIN) 5 MG tablet Take 2.5-5 mg by mouth daily. Take 5mg  daily except 2.5mg  on Tuesdays and Thursdays       No current facility-administered medications for this visit.   Facility-Administered Medications Ordered in Other Visits  Medication Dose Route Frequency Provider Last Rate Last Dose  .  sodium chloride 0.9 % injection 10 mL  10 mL Intracatheter PRN Artis Delay, MD   10 mL at 10/16/13 1550    C/C: "I want dentures."  HPI:  Gabriel Estrada is an 79 year old male with a history of non-Hodgkin's lymphoma of the left nasal cavity. The patient underwent extraction of remaining teeth with alveoloplasty and pre-prosthetic surgery on 05/30/2013 prior to anticipated chemoradiation therapy. The patient then underwent chemotherapy and completed the chemotherapy on 10/16/2013. Patient is not going to be treated with radiation therapy per patient report. The patient now presents for periodic oral examination and evaluation for fabrication of upper and lower complete dentures.  DENTAL EXAM: General: The patient is a well-developed, well-nourished male in no acute distress. Patient walks with the cane to aid ambulation. Vitals: BP 150/81  Pulse 75  Temp(Src) 97.8 F (36.6 C) (Oral) Extraoral Exam: There is no submandibular lymphadenopathy.  There are no TMJ Symptoms. Intraoral  Exam: Patient has normal saliva. There are no soft tissue lesions noted. The patient is now edentulous. Dentition: Edentulous. Prosthodontic: The patient currently has no dentures. The patient is interested in starting the fabrication of upper lower complete dentures at this time. A quote was provided for the dentures.  Assessments: 1. The patient is edentulous. 2. There is atrophy of the edentulous alveolar ridges. 3. The patient has normal saliva.  Plan:  1.  The patient is to return to clinic tomorrow for start of upper and lower complete denture fabrication.  Charlynne Pander, DDS

## 2013-11-11 NOTE — Patient Instructions (Signed)
Return to clinic as scheduled for start of upper and lower complete denture fabrication. Dr. Kristin Bruins

## 2013-11-12 ENCOUNTER — Ambulatory Visit (HOSPITAL_COMMUNITY): Payer: Medicare Other | Admitting: Dentistry

## 2013-11-12 ENCOUNTER — Encounter (HOSPITAL_COMMUNITY): Payer: Self-pay | Admitting: Dentistry

## 2013-11-12 VITALS — BP 124/71 | HR 74 | Temp 97.8°F

## 2013-11-12 DIAGNOSIS — K08109 Complete loss of teeth, unspecified cause, unspecified class: Secondary | ICD-10-CM

## 2013-11-12 DIAGNOSIS — K0820 Unspecified atrophy of edentulous alveolar ridge: Secondary | ICD-10-CM

## 2013-11-12 DIAGNOSIS — Z463 Encounter for fitting and adjustment of dental prosthetic device: Secondary | ICD-10-CM

## 2013-11-12 NOTE — Patient Instructions (Signed)
Return to clinic as scheduled for continued upper and lower complete denture fabrication. Dr. Kulinski 

## 2013-11-12 NOTE — Progress Notes (Signed)
11/12/2013  Patient:            Gabriel Estrada Date of Birth:  November 17, 1928 MRN:                [id number]  BP 124/71  Pulse 74  Temp(Src) 97.8 F (36.6 C) (Oral)   [name] presents for start of upper and lower denture fabrication. Exam: Patient is edentulous. Discussed procedures involved in upper and lower denture fabrication and prognosis for successful ability to wear dentures. Price for dentures confirmed.  Patient agrees to proceed with upper and lower denture fabrication. Procedure:  Upper and lower denture primary impressions in alginate. Lab pour. To [facility] for upper and lower denture custom tray fabrication. RTC for upper and lower denture final impressions.  Charlynne Pander, DDS

## 2013-11-18 ENCOUNTER — Telehealth: Payer: Self-pay | Admitting: Hematology and Oncology

## 2013-11-18 ENCOUNTER — Ambulatory Visit (HOSPITAL_BASED_OUTPATIENT_CLINIC_OR_DEPARTMENT_OTHER): Payer: Medicare Other | Admitting: Hematology and Oncology

## 2013-11-18 ENCOUNTER — Encounter: Payer: Self-pay | Admitting: *Deleted

## 2013-11-18 VITALS — BP 120/53 | HR 87 | Temp 97.4°F | Resp 18 | Ht 72.0 in | Wt 160.3 lb

## 2013-11-18 DIAGNOSIS — C8589 Other specified types of non-Hodgkin lymphoma, extranodal and solid organ sites: Secondary | ICD-10-CM

## 2013-11-18 DIAGNOSIS — I4891 Unspecified atrial fibrillation: Secondary | ICD-10-CM

## 2013-11-18 DIAGNOSIS — N189 Chronic kidney disease, unspecified: Secondary | ICD-10-CM

## 2013-11-18 DIAGNOSIS — D72819 Decreased white blood cell count, unspecified: Secondary | ICD-10-CM

## 2013-11-18 DIAGNOSIS — D631 Anemia in chronic kidney disease: Secondary | ICD-10-CM

## 2013-11-18 DIAGNOSIS — C8330 Diffuse large B-cell lymphoma, unspecified site: Secondary | ICD-10-CM

## 2013-11-18 NOTE — Progress Notes (Signed)
Met with patient and his sister during scheduled appt with Dr. Bertis Ruddy to provide support and care continuity.  Pt indicated understanding of seeing RadOnc MD to address site identified on PET.  Will continue to navigate as L3 (treatments completed) patient.  Young Berry, RN, BSN, [facility] Head & Neck Oncology Navigator [phone]

## 2013-11-18 NOTE — Telephone Encounter (Signed)
Called pt , left message regarding radiation appt on 12/1 with Dr. Mitzi Hansen

## 2013-11-18 NOTE — Progress Notes (Signed)
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  The patient had a left nasal mucosa biopsy and left nasal mass soft tissue biopsy on 12/12/2012 which showed large B-cell lymphoma-non-Hodgkin's lymphoma, diffuse large cell type, high grade.  2.  PET scan on 06/05/2013 showed a 4.1 x 1.7 cm left nasal mass, corresponding to known lymphoma, focal patchy/nodular opacity in posterior left upper lobe favored to be infectious/inflammatory, 2.6 x 1.1 cm mass along left posterior lateral bladder wall, suspicious for a primary bladder neoplasm.  2.  Status post transurethral bladder resection of 3 cm bladder tumor with bilateral retrograde pyelography on 07/08/2013 for a low-grade papillary urothelial carcinoma.   3.  from 08/30/2013 to 10/16/2013, he received 3 cycles of chemotherapy consisting of RCEOP (Rituxan/Cytoxan/Etoposide/Vincristine/Prednisone)  INTERVAL HISTORY: Gabriel Estrada 79 y.o. male returns for further followup.  The patient denies any mouth sores, nausea, vomiting or change in bowel habits He denies any recent fever, chills, night sweats or abnormal weight loss  he is on chronic anticoagulation for his cardiac condition. The patient denies any recent signs or symptoms of bleeding such as spontaneous epistaxis, hematuria or hematochezia.  I have reviewed the past medical history, past surgical history, social history and family history with the patient and they are unchanged from previous note.  ALLERGIES:  has No Known  Allergies.  MEDICATIONS:  Current Outpatient Prescriptions  Medication Sig Dispense Refill  . carvedilol (COREG) 6.25 MG tablet TAKE 1 TABLET BY MOUTH TWICE DAILY WITH MEALS  60 tablet  3  . digoxin (LANOXIN) 0.125 MG tablet Take 0.125 mg by mouth every evening.       . docusate sodium (COLACE) 100 MG capsule Take 1 capsule (100 mg total) by mouth 2 (two) times daily.  30 capsule  0  . ezetimibe-simvastatin (VYTORIN) 10-40 MG per tablet Take 1 tablet by mouth at bedtime.      . furosemide (LASIX) 20 MG tablet Take 1 tablet (20 mg total) by mouth daily.  30 tablet  6  . lidocaine-prilocaine (EMLA) cream Apply topically as needed.  30 g  2  . Multiple Vitamin (MULTIVITAMIN WITH MINERALS) TABS Take 1 tablet by mouth daily.      . tamsulosin (FLOMAX) 0.4 MG CAPS Take 0.4 mg by mouth 1 day or 1 dose. bedtime      . warfarin (COUMADIN) 5 MG tablet Take 2.5-5 mg by mouth daily. Take 5mg  daily except 2.5mg  on Tuesdays and Thursdays      . LORazepam (ATIVAN) 0.5 MG tablet Take 1 tablet (0.5 mg total) by mouth every 6 (six) hours as needed (Nausea or vomiting).  30 tablet  0  . ondansetron (ZOFRAN) 8 MG tablet Take 1 tablet (8 mg total) by mouth 2 (two) times daily. Take two times a day starting the day after chemo for 3 days. Then take two times a day as needed for nausea or vomiting.  30 tablet  1  . prochlorperazine (COMPAZINE) 10 MG tablet Take 10 mg by mouth every 6 (six) hours as needed (Nausea or vomiting).       No  current facility-administered medications for this visit.   Facility-Administered Medications Ordered in Other Visits  Medication Dose Route Frequency Provider Last Rate Last Dose  . sodium chloride 0.9 % injection 10 mL  10 mL Intracatheter PRN Artis Delay, MD   10 mL at 10/16/13 1550    REVIEW OF SYSTEMS:   Constitutional: Denies fevers, chills or abnormal weight loss Behavioral/Psych: Mood is stable, no new changes  All other systems were reviewed with the patient and are  negative.  PHYSICAL EXAMINATION: ECOG PERFORMANCE STATUS: 0 - Asymptomatic  Filed Vitals:   11/18/13 0927  BP: 120/53  Pulse: 87  Temp: 97.4 F (36.3 C)  Resp: 18   Filed Weights   11/18/13 0927  Weight: 160 lb 4.8 oz (72.712 kg)    GENERAL:alert, no distress and comfortable SKIN: skin color, texture, turgor are normal, no rashes or significant lesions EYES: normal, Conjunctiva are pink and non-injected, sclera clear OROPHARYNX:no exudate, no erythema and lips, buccal mucosa, and tongue normal  NECK: supple, thyroid normal size, non-tender, without nodularity LYMPH:  no palpable lymphadenopathy in the cervical, axillary or inguinal Musculoskeletal:no cyanosis of digits and no clubbing  NEURO: alert & oriented x 3 with fluent speech, no focal motor/sensory deficits  LABORATORY DATA:  I have reviewed the data as listed    Component Value Date/Time   NA 140 10/14/2013 0750   NA 139 08/02/2013 1621   K 4.0 10/14/2013 0750   K 4.5 08/02/2013 1621   CL 100 08/02/2013 1621   CL 104 05/29/2013 1328   CO2 23 10/14/2013 0750   CO2 29 08/02/2013 1621   GLUCOSE 107 10/14/2013 0750   GLUCOSE 86 08/02/2013 1621   GLUCOSE 98 05/29/2013 1328   BUN 27.3* 10/14/2013 0750   BUN 40* 08/02/2013 1621   CREATININE 1.6* 10/14/2013 0750   CREATININE 1.92* 08/02/2013 1621   CREATININE 2.2* 07/29/2013 0852   CALCIUM 9.2 10/14/2013 0750   CALCIUM 9.5 08/02/2013 1621   PROT 6.4 10/14/2013 0750   PROT 6.3 07/22/2013 1250   ALBUMIN 3.2* 10/14/2013 0750   ALBUMIN 3.3* 07/22/2013 1250   AST 19 10/14/2013 0750   AST 32 07/22/2013 1250   ALT 13 10/14/2013 0750   ALT 37 07/22/2013 1250   ALKPHOS 81 10/14/2013 0750   ALKPHOS 69 07/22/2013 1250   BILITOT 0.67 10/14/2013 0750   BILITOT 1.1 07/22/2013 1250   GFRNONAA 29* 07/26/2013 0607   GFRAA 34* 07/26/2013 0607    No results found for this basename: SPEP,  UPEP,   kappa and lambda light chains    Lab Results  Component Value Date   WBC 3.6* 10/14/2013   NEUTROABS  2.4 10/14/2013   HGB 10.7* 10/14/2013   HCT 31.6* 10/14/2013   MCV 87.0 10/14/2013   PLT 222 10/14/2013      Chemistry      Component Value Date/Time   NA 140 10/14/2013 0750   NA 139 08/02/2013 1621   K 4.0 10/14/2013 0750   K 4.5 08/02/2013 1621   CL 100 08/02/2013 1621   CL 104 05/29/2013 1328   CO2 23 10/14/2013 0750   CO2 29 08/02/2013 1621   BUN 27.3* 10/14/2013 0750   BUN 40* 08/02/2013 1621   CREATININE 1.6* 10/14/2013 0750   CREATININE 1.92* 08/02/2013 1621   CREATININE 2.2* 07/29/2013 0852      Component Value Date/Time   CALCIUM 9.2 10/14/2013 0750   CALCIUM 9.5 08/02/2013 1621   ALKPHOS 81 10/14/2013 0750  ALKPHOS 69 07/22/2013 1250   AST 19 10/14/2013 0750   AST 32 07/22/2013 1250   ALT 13 10/14/2013 0750   ALT 37 07/22/2013 1250   BILITOT 0.67 10/14/2013 0750   BILITOT 1.1 07/22/2013 1250     RADIOGRAPHIC STUDIES: I have personally reviewed the radiological images as listed and agreed with the findings in the report. I reviewed the PET/CT scan with him and his wife. He has near-complete resolution of his disease. This to a very small area of hypermetabolic activity in the nasal passage   ASSESSMENT & PLAN:  #1 diffuse large B-cell lymphoma Per NCCN guidelines I recommend we stop after 3 cycles of chemotherapy and refer him back to radiation Department for consolidation therapy. The patient was adamant he does not want additional treatment. I reminded the patient is still some activity seen in the nasal passage and radiation therapy would be recommended. He agreed to go back for second opinion. #2  anemia This is likely anemia of chronic disease. The patient denies recent history of bleeding such as epistaxis, hematuria or hematochezia. He is asymptomatic from the anemia. We will observe for now.  He does not require transfusion now.  #3 leukopenia This is likely due to recent treatment. The patient denies recent history of fevers, cough, chills, diarrhea or dysuria. He is  asymptomatic from the leukopenia. I will observe for now.   #4 venous access I recommend we hold the port until I see him back next month to make sure that his blood count resolved back to normal before we have the port removed. #5 history of bladder cancer The something suspicious seen in the pelvic region from recent PET/CT scan. He will continue with urology followup #6 atrial fibrillation He will continue chronic anticoagulation therapy under the direction of his primary care provider #7 chronic kidney disease This has been stable. We will observe carefully.   #8 abnormal areas in the lung on PET CT scan This is likely related to prior infection. We will observe with CT scan in the future.  Orders Placed This Encounter  Procedures  . Comprehensive metabolic panel    Standing Status: Future     Number of Occurrences:      Standing Expiration Date: 11/18/2014  . CBC with Differential    Standing Status: Future     Number of Occurrences:      Standing Expiration Date: 08/10/2014  . Lactate dehydrogenase    Standing Status: Future     Number of Occurrences:      Standing Expiration Date: 11/18/2014  . Ambulatory referral to Radiation Oncology    Referral Priority:  Routine    Referral Type:  Consultation    Referral Reason:  Specialty Services Required    Requested Specialty:  Radiation Oncology    Number of Visits Requested:  1   All questions were answered. The patient knows to call the clinic with any problems, questions or concerns. No barriers to learning was detected.    Jaycelyn Orrison, MD 11/18/2013 10:36 AM

## 2013-11-19 ENCOUNTER — Encounter (HOSPITAL_COMMUNITY): Payer: Self-pay | Admitting: Dentistry

## 2013-11-19 ENCOUNTER — Ambulatory Visit (HOSPITAL_COMMUNITY): Payer: Medicare Other | Admitting: Dentistry

## 2013-11-19 VITALS — BP 96/53 | HR 69 | Temp 98.2°F

## 2013-11-19 DIAGNOSIS — K0820 Unspecified atrophy of edentulous alveolar ridge: Secondary | ICD-10-CM

## 2013-11-19 DIAGNOSIS — Z463 Encounter for fitting and adjustment of dental prosthetic device: Secondary | ICD-10-CM

## 2013-11-19 NOTE — Patient Instructions (Signed)
Return to clinic as scheduled for continued fabrication of upper and lower complete dentures. Ferdinand Revoir F. Niraj Kudrna, DDS 

## 2013-11-19 NOTE — Progress Notes (Signed)
11/19/2013  Patient:            [name] Date of Birth:  02/26/1928 MRN:                [id number]   BP 96/53  Pulse 69  Temp(Src) 98.2 F (36.8 C) (Oral)  [name] presents for continued upper and lower complete denture fabrication. Procedure:  Upper and lower border molding and final impressions in [employer]. Patient tolerated procedure well. To [employer] for custom baseplates with rims. Return to clinic for upper and lower complete denture jaw relations.  Charlynne Pander, DDS

## 2013-11-20 ENCOUNTER — Ambulatory Visit (INDEPENDENT_AMBULATORY_CARE_PROVIDER_SITE_OTHER): Payer: Medicare Other | Admitting: Pharmacist

## 2013-11-20 DIAGNOSIS — I4891 Unspecified atrial fibrillation: Secondary | ICD-10-CM

## 2013-11-20 DIAGNOSIS — Z7901 Long term (current) use of anticoagulants: Secondary | ICD-10-CM

## 2013-11-20 LAB — POCT INR: INR: 2.6

## 2013-11-20 NOTE — Progress Notes (Signed)
[facility] Location of Tumor / Histology: Left Nasal  FUNC seen 05/21/13 [name]  Patient presented  Biopsies of 12/12/12= Diagnosis1. Nasal mucosa, biopsy, leftLARGE B CELL LYMPHOMA2. Soft tissue mass, biopsy, left nasal massLARGE B CELL LYMPHOMA.  Histologic type: Non Hodgkins lymphoma, diffuse large cell type high grade  Nutrition Status:  Weight changes: no  Swallowing status: good  Plans, if any, for PEG tube: no  Tobacco/Marijuana/Snuff/ETOH use: quit 2 years ago, no alcohol or illicit drugs  Past/Anticipated interventions by otolaryngology, if any: 11/19/13: Gabriel Estrada presents for continued upper and lower complete denture fabrication.  Procedure:  Upper and lower border molding and final impressions in Aquasil.  Patient tolerated procedure well.  To [employer] for custom baseplates with rims.  Return to clinic for upper and lower complete denture jaw relations.  Gabriel Estrada, Gabriel Estrada    Past/Anticipated interventions by medical oncology, if any: 2. PET scan on 06/05/2013 showed a 4.1 x 1.7 cm left nasal mass, corresponding to known lymphoma, focal patchy/nodular opacity in posterior left upper lobe favored to be infectious/inflammatory, 2.6 x 1.1 cm mass along left posterior lateral bladder wall, suspicious for a primary bladder neoplasm.  2. Status post transurethral bladder resection of 3 cm bladder tumor with bilateral retrograde pyelography on 07/08/2013 for a  low-grade papillary urothelial carcinoma.  3. from 08/30/2013 to 10/16/2013, he received 3 cycles of chemotherapy consisting of RCEOP (Rituxan/Cytoxan/Etoposide/Vincristine/Prednisone  Referrals yet, to any of the following?  Social Work? no  Dentistry? Yes,Gabriel Estrada  Swallowing therapy?   Nutrition? yes  Med/Onc? Yes, Gabriel Estrada, now Gabriel Estrada  PEG placement? no  SAFETY ISSUES:  Prior radiation? no  Pacemaker/ICD? no    Is the patient on methotrexate? no  Current Complaints /  other details: married, 4 children,  Coronary artery bypass  x5 02/23/01, father unknown  cancer, maternal aunt  cancer,MI brothers and mother,heart disease in his  sisters

## 2013-11-25 ENCOUNTER — Ambulatory Visit
Admission: RE | Admit: 2013-11-25 | Discharge: 2013-11-25 | Disposition: A | Discharge status: Home / Self Care | Payer: Medicare Other | Source: Ambulatory Visit | Attending: Radiation Oncology | Admitting: Radiation Oncology

## 2013-11-25 ENCOUNTER — Encounter: Payer: Self-pay | Admitting: Radiation Oncology

## 2013-11-25 VITALS — BP 114/64 | HR 73 | Temp 97.2°F | Resp 20 | Ht 72.0 in | Wt 160.4 lb

## 2013-11-25 DIAGNOSIS — C8589 Other specified types of non-Hodgkin lymphoma, extranodal and solid organ sites: Secondary | ICD-10-CM

## 2013-11-25 DIAGNOSIS — C8330 Diffuse large B-cell lymphoma, unspecified site: Secondary | ICD-10-CM

## 2013-11-25 DIAGNOSIS — Z9221 Personal history of antineoplastic chemotherapy: Secondary | ICD-10-CM | POA: Insufficient documentation

## 2013-11-25 NOTE — Progress Notes (Signed)
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  The patient previously saw Dr.Squire in our department who is now out on maternity leave. She had previously discussed proceeding with consolidative radiation treatment after several cycles of chemotherapy. The patient indicates that this has been completed and he did very well. He ultimately was treated by Dr. Bertis Ruddy who has referred the patient back for consideration of radiation treatment. He is status post 3 cycles ofRCEOP and then subsequently underwent a PET scan on 11/11/2013. This showed a positive response to treatment with a small focus of residual hypermetabolism in the previous involved area within the left nasal cavity. No definite evidence of distant disease.  The patient states that he did very well during treatment. He remains very active and has no complaints today related to the nasal cavity area.                               ALLERGIES:  has No Known Allergies.  Meds: Current Outpatient Prescriptions  Medication Sig Dispense Refill  . allopurinol (ZYLOPRIM) 300 MG tablet Take 300 mg by mouth daily.      . carvedilol (COREG) 6.25 MG tablet TAKE 1 TABLET BY MOUTH TWICE DAILY WITH MEALS  60 tablet  3  . digoxin (LANOXIN) 0.125 MG tablet Take 0.125 mg by mouth every evening.       . docusate sodium (COLACE) 100 MG capsule Take 1 capsule (100 mg total) by mouth 2 (two) times daily.  30 capsule  0  . ezetimibe-simvastatin (VYTORIN) 10-40 MG per tablet Take 1 tablet by mouth at bedtime.      . furosemide (LASIX) 20 MG tablet Take 1 tablet (20 mg total) by mouth daily.  30 tablet  6  .  lidocaine-prilocaine (EMLA) cream Apply topically as needed.  30 g  2  . Multiple Vitamin (MULTIVITAMIN WITH MINERALS) TABS Take 1 tablet by mouth daily.      . ondansetron (ZOFRAN) 8 MG tablet Take 1 tablet (8 mg total) by mouth 2 (two) times daily. Take two times a day starting the day after chemo for 3 days. Then take two times a day as needed for nausea or vomiting.  30 tablet  1  . prochlorperazine (COMPAZINE) 10 MG tablet Take 10 mg by mouth every 6 (six) hours as needed (Nausea or vomiting).      . tamsulosin (FLOMAX) 0.4 MG CAPS Take 0.4 mg by mouth 1 day or 1 dose. bedtime      . warfarin (COUMADIN) 5 MG tablet Take 2.5-5 mg by mouth daily. Take 5mg  daily except 2.5mg  on Tuesdays and Thursdays      . LORazepam (ATIVAN) 0.5 MG tablet Take 1 tablet (0.5 mg total) by mouth every 6 (six) hours as needed (Nausea or vomiting).  30 tablet  0   No current facility-administered medications for this encounter.   Facility-Administered Medications Ordered in Other Encounters  Medication Dose Route Frequency Provider Last Rate Last Dose  . sodium chloride 0.9 % injection 10 mL  10 mL Intracatheter PRN Artis Delay, MD  10 mL at 10/16/13 1550    Physical Findings: The patient is in no acute distress. Patient is alert and oriented.  height is 6' (1.829 m) and weight is 160 lb 6.4 oz (72.757 kg). His oral temperature is 97.2 F (36.2 C). His blood pressure is 114/64 and his pulse is 73. His respiration is 20 and oxygen saturation is 95%. .   General: Well-developed, in no acute distress HEENT: Normocephalic, atraumatic; oral cavity clear; both nasal cavities clear Neck: Supple without any lymphadenopathy Cardiovascular: Regular rate and rhythm Respiratory: Clear to auscultation bilaterally GI: Soft, nontender, normal bowel sounds Extremities: No edema present Neuro: No focal deficits    Lab Findings: Lab Results  Component Value Date   WBC 3.6* 10/14/2013   HGB 10.7* 10/14/2013   HCT 31.6*  10/14/2013   MCV 87.0 10/14/2013   PLT 222 10/14/2013     Radiographic Findings: Nm Pet Image Restag (ps) Skull Base To Thigh  11/11/2013   CLINICAL DATA:  Subsequent treatment strategy for lymphoma. Restaging scan after chemotherapy.  EXAM: NUCLEAR MEDICINE PET SKULL BASE TO THIGH  FASTING BLOOD GLUCOSE:  Value: 89mg /dl  TECHNIQUE: 16.1 mCi W-96 FDG was injected intravenously. CT data was obtained and used for attenuation correction and anatomic localization only. (This was not acquired as a diagnostic CT examination.) Additional exam technical data entered on technologist worksheet.  COMPARISON:  PET-CT 06/05/2013.  FINDINGS: NECK  No hypermetabolic lymph nodes in the neck. The previously noted soft tissue mass along the left side of the nasal septum is no longer identified by CT imaging. There is a tiny focus of hypermetabolism (SUVmax = 5.1) in this region, however, such that a small focus of residual disease is not excluded.  CHEST  As with the prior examination there is a nonspecific area of architectural distortion which is somewhat nodular in appearance in the posterior aspect of the left upper lobe. Within this region, there appears to be internal areas of bronchiectasis and probable bronchiolectasis, and there is mild diffuse hypermetabolism in this region (SUVmax = 3.3), with without a definite soft tissue nodule to strongly suggest neoplasm; these findings are favored to represent an area of chronic scarring and likely chronic colonization/infection. No other concerning pulmonary nodules or masses are identified at this time. There is a background of mild centrilobular emphysema. Patchy areas of ground-glass attenuation are also noted in the periphery of the right upper lobe, particularly posteriorly, similar to the prior study. Small right and trace left pleural effusions both layer dependently. Heart size is mildly enlarged. There is no significant pericardial fluid, thickening or pericardial  calcification. There is atherosclerosis of the thoracic aorta, the great vessels of the mediastinum and the coronary arteries, including calcified atherosclerotic plaque in the left main, left anterior descending, left circumflex and right coronary arteries. Status post median sternotomy for CABG, including LIMA. Calcifications of the aortic valve. Right internal jugular single-lumen porta cath with tip terminating in the right atrium. Small hiatal hernia.  ABDOMEN/PELVIS  1 cm low-attenuation lesion in segment 2 of the liver demonstrates no hypermetabolism, and likely represents a small cyst (incompletely characterized on today's non contrast CT examination). Several small calcified gallstones are present in the neck of the gallbladder. No current findings to suggest acute cholecystitis at this time. The unenhanced appearance of the pancreas, spleen and bilateral adrenal glands is unremarkable. There are numerous curvilinear calcifications associated with the renal hila bilaterally, strongly favored to represent vascular calcifications (less likely to represent nonobstructive calculi).  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 No significant volume of ascites. Pneumoperitoneum. No pathologic distention of small bowel. Numerous colonic diverticulae are noted, particularly in the region of the sigmoid colon, without surrounding inflammatory changes to suggest an acute diverticulitis at this time. Urinary bladder is nearly completely decompressed. The previously suspected bladder lesion is not confidently identified on today's study.  SKELETON  No focal hypermetabolic activity to suggest skeletal metastasis. Sternotomy wires.  IMPRESSION: 1. Positive response to therapy with near complete resolution of hypermetabolism along the left side of the nasal septum. Previously identified soft tissue mass  in this region has completely resolved, and currently there is only 1 tiny focus of hypermetabolism in this area. The possibility of a small focus of residual disease is not excluded, and attention on followup studies is recommended. 2. No definite evidence of metastatic disease elsewhere on today's examination. 3. Nonspecific focus of architectural distortion and low level hypermetabolism in the posterior aspect of the left upper lobe is similar to the prior study. This is favored to represent an area of chronic scarring likely with some chronic colonization/infection. Strictly speaking, the possibility of a slow-growing indolent neoplasm such as a low-grade adenocarcinoma is not entirely excluded, and continued attention on followup studies is recommended. 4. Mild centrilobular and paraseptal emphysema. 5. Small right and trace left pleural effusions. 6. Mild cardiomegaly. 7. Atherosclerosis, including left main and 3 vessel coronary artery disease. Please note that although the presence of coronary artery calcium documents the presence of coronary artery disease, the severity of this disease and any potential stenosis cannot be assessed on this non-gated CT examination. Assessment for potential risk factor modification, dietary therapy or pharmacologic therapy may be warranted, if clinically indicated. Status post median sternotomy for CABG including LIMA. 8. Cholelithiasis without evidence to suggest acute cholecystitis at this time. 9. Colonic diverticulosis without evidence to suggest acute diverticulitis at this time.   Electronically Signed   By: Trudie Reed M.D.   On: 11/11/2013 13:18    Impression:    The patient has completed 3 cycles of chemotherapy and now is an appropriate candidate to proceed with consolidative radiation treatment. I discussed with the patient an approximate 3 to 3 1/2 week course of treatment.  I discussed with them the expected benefit of such a treatment in terms of  local/regional control. We also discussed the possible side effects and risks of treatment. All of his questions were answered.  The patient wishes to proceed with radiation treatment at this time  Plan:  He will proceed with a simulation such that we can begin treatment planning.   Radene Gunning, M.D., Ph.D.

## 2013-11-25 NOTE — Progress Notes (Signed)
Please see the Nurse Progress Note in the MD Initial Consult Encounter for this patient. 

## 2013-11-27 ENCOUNTER — Encounter (HOSPITAL_COMMUNITY): Payer: Self-pay | Admitting: Dentistry

## 2013-11-27 ENCOUNTER — Ambulatory Visit (HOSPITAL_COMMUNITY): Payer: Medicare Other | Admitting: Dentistry

## 2013-11-27 VITALS — BP 119/72 | HR 71 | Temp 97.6°F

## 2013-11-27 DIAGNOSIS — K0820 Unspecified atrophy of edentulous alveolar ridge: Secondary | ICD-10-CM

## 2013-11-27 DIAGNOSIS — Z463 Encounter for fitting and adjustment of dental prosthetic device: Secondary | ICD-10-CM

## 2013-11-27 NOTE — Progress Notes (Signed)
11/27/2013  Patient:            Gabriel Estrada Date of Birth:  1928-08-30 MRN:                [id number]  BP 119/72  Pulse 71  Temp(Src) 97.6 F (36.4 C) (Oral)  [name] presents for continued denture fabrication. Procedure:  Upper and lower denture Jaw relations with aluwax bite registration. Patient agrees to tooth selection of 22 H, P, and 10 degree posteriors to match with Portrait A2 shade. Patient tolerated procedure well. RTC for denture wax try in.  Charlynne Pander, DDS

## 2013-11-27 NOTE — Patient Instructions (Signed)
Return to clinic as scheduled for continued upper lower complete denture fabrication. Dr. Sahiti Joswick 

## 2013-11-28 ENCOUNTER — Ambulatory Visit
Admission: RE | Admit: 2013-11-28 | Discharge: 2013-11-28 | Disposition: A | Discharge status: Home / Self Care | Payer: Medicare Other | Source: Ambulatory Visit | Attending: Radiation Oncology | Admitting: Radiation Oncology

## 2013-11-28 DIAGNOSIS — Z79899 Other long term (current) drug therapy: Secondary | ICD-10-CM | POA: Insufficient documentation

## 2013-11-28 DIAGNOSIS — Z510 Encounter for antineoplastic radiation therapy: Secondary | ICD-10-CM | POA: Insufficient documentation

## 2013-11-28 DIAGNOSIS — C8581 Other specified types of non-Hodgkin lymphoma, lymph nodes of head, face, and neck: Secondary | ICD-10-CM | POA: Insufficient documentation

## 2013-11-29 ENCOUNTER — Other Ambulatory Visit: Payer: Self-pay

## 2013-11-29 MED ORDER — EZETIMIBE-SIMVASTATIN 10-40 MG PO TABS: 1.0000 | ORAL_TABLET | Freq: Every day | ORAL | Status: DC

## 2013-12-04 ENCOUNTER — Encounter (HOSPITAL_COMMUNITY): Payer: Self-pay | Admitting: Dentistry

## 2013-12-04 ENCOUNTER — Ambulatory Visit (HOSPITAL_COMMUNITY): Payer: Medicare Other | Admitting: Dentistry

## 2013-12-04 VITALS — BP 122/58 | HR 83 | Temp 97.8°F

## 2013-12-04 DIAGNOSIS — Z463 Encounter for fitting and adjustment of dental prosthetic device: Secondary | ICD-10-CM

## 2013-12-04 DIAGNOSIS — K08109 Complete loss of teeth, unspecified cause, unspecified class: Secondary | ICD-10-CM

## 2013-12-04 DIAGNOSIS — K0820 Unspecified atrophy of edentulous alveolar ridge: Secondary | ICD-10-CM

## 2013-12-04 NOTE — Patient Instructions (Signed)
Return to clinic for insertion of upper and lower complete dentures.  Dr. Kristin Bruins

## 2013-12-04 NOTE — Progress Notes (Signed)
12/04/2013  Patient:            Gabriel Estrada Date of Birth:  1928/11/08 MRN:                [id number]   BP 122/58  Pulse 83  Temp(Src) 97.8 F (36.6 C) (Oral)  Gabriel Estrada presents for continued upper and lower denture fabrication. Procedure:  Upper and lower denture wax tryin. Patient accepts esthetics, phonetics, fit and function. Patient agrees to process "as is" in [place] [address]. Patient to RTC for  upper and lower denture insertion.  Charlynne Pander, DDS

## 2013-12-05 ENCOUNTER — Other Ambulatory Visit: Payer: Self-pay | Admitting: Radiation Oncology

## 2013-12-09 ENCOUNTER — Telehealth: Payer: Self-pay | Admitting: *Deleted

## 2013-12-09 ENCOUNTER — Ambulatory Visit: Payer: Medicare Other | Admitting: Radiation Oncology

## 2013-12-09 ENCOUNTER — Ambulatory Visit: Payer: Medicare Other

## 2013-12-09 NOTE — Telephone Encounter (Signed)
Patient called and spoke with RT Miranda,cancelling todays new start of rad tx,wants to wait until 1st of the new Year, will e-m,ail Dr.Moody who is in [place] today, and will inbasket him as well, thanked Miranda for the message 10:01 AM

## 2013-12-10 ENCOUNTER — Ambulatory Visit: Payer: Medicare Other

## 2013-12-11 ENCOUNTER — Ambulatory Visit: Payer: Medicare Other

## 2013-12-11 ENCOUNTER — Encounter (HOSPITAL_COMMUNITY): Payer: Self-pay | Admitting: Dentistry

## 2013-12-11 ENCOUNTER — Ambulatory Visit (HOSPITAL_COMMUNITY): Payer: Medicare Other | Admitting: Dentistry

## 2013-12-11 VITALS — BP 111/57 | HR 67 | Temp 97.7°F

## 2013-12-11 DIAGNOSIS — K0820 Unspecified atrophy of edentulous alveolar ridge: Secondary | ICD-10-CM

## 2013-12-11 DIAGNOSIS — Z463 Encounter for fitting and adjustment of dental prosthetic device: Secondary | ICD-10-CM

## 2013-12-11 DIAGNOSIS — K08109 Complete loss of teeth, unspecified cause, unspecified class: Secondary | ICD-10-CM

## 2013-12-11 NOTE — Patient Instructions (Signed)
Instructions for Denture Use and Care  Congratulations, you are on the way to oral rehabilitation!  You have just received a new set of complete or partial dentures.  These prostheses will help to improve both your appearance and chewing ability.  These instructions will help you get adjusted to your dentures as well as care for them properly.  Please read these instructions carefully and completely as soon as you get home.  If you or your caregiver have any questions please notify the [facility] at [phone].  HOW YOUR DENTURES LOOK AND FEEL Soon after you begin wearing your dentures, you may feel that your dentures are too large or even loose.  As our mouth and facial muscles become accustomed to the dentures, these feelings will go away.  You also may feel that you are salivating more than you normally do.  This feeling should go away as you get used to having the dentures in your mouth.  You may bite your cheek or your tongue; this will eventually resolve itself as you wear your dentures.  Some soreness is to be expected, but you should not hurt.  If your mouth hurts, call your dentist.  A denture adhesive may occasionally be necessary to hold your dentures in place more securely.  The dentist will let you know when one is recommended for you.  SPEAKING Wearing dentures will change the sound of your voice initially.  This will be noticed by you more than anyone else.  Bite and swallow before you speak, in order to place your dentures in position so that you may speak more clearly.  Practice speaking by reading aloud or counting from 1 to 100 very slowly and distinctly.  After some practice your mouth will become accustomed to your dentures and you will speak more clearly.  EATING Chewing will definitely be different after you receive your dentures.  With a little practice and patience you should be able to eat just about any kind of food.  Begin by eating small quantities of food  that are cut into small pieces.  Star with soft foods such as eggs, cooked vegetables, or puddings.  As you gain confidence advance  Your diet to whatever texture foods you can tolerate.  DENTURE CARE Dentures can collect plaque and calculus much the same as natural teeth can.  If not removed on a regular basis, your dentures will not look or feel clean, and you will experience denture odor.  It is very important that you remove your dentures at bedtime and clean them thoroughly.  You should: 1. Clean your dentures over a sink full of water so if dropped, breakage will be prevented. 2. Rinse your dentures with cool water to remove any large food particles. 3. Use soap and water or a denture cleanser or paste to clean the dentures.  Do not use regular toothpaste as it may abrade the denture base or teeth. 4. Use a moistened denture brush to clean all surfaces (inside and outside). 5. Rinse thoroughly to remove any remaining soap or denture cleanser. 6. Use a soft bristle toothbrush to gently brush any natural teeth, gums, tongue, and palate at bedtime and before reinserting your dentures. 7. Do not sleep with your dentures in your mouth at night.  Remove your dentures and soak them overnight in a denture cup filled with water or denture solution as recommended by your dentist.  This routine will become second nature and will increase the life and comfort   of your dentures.  Please do not try to adjust these dentures yourself; you could damage them.  FOLLOW-UP You should call or make an appointment with your dentist.  Your dentist would like to see you at least once a year for a check-up and examination. 

## 2013-12-11 NOTE — Progress Notes (Signed)
12/11/2013  Patient:            Gabriel Estrada Date of Birth:  [birth year] MRN:                [id number]  BP 111/57  Pulse 67  Temp(Src) 97.7 F (36.5 C) (Oral)  [name] presents for insertion of upper and lower complete dentures. Procedure: Pressure indicating paste was applied to the dentures. Adjustments were made as needed. [place]. Occlusion evaluated and adjustments made as needed for Centric Relation and protrusive strokes. Good esthetics, phonetics, fit, and function noted. Patient accepts results. Post op instructions provided in written and verbal formats on use and care of dentures. Gave patient denture brush and cup. Patient to keep dentures out if sore spots develop. Use salt water rinses as needed to aid healing. Return to clinic as scheduled for denture adjustment.  Call if problems arise before then. Patient dismissed in stable condition.  Charlynne Pander, DDS

## 2013-12-12 ENCOUNTER — Ambulatory Visit: Payer: Medicare Other

## 2013-12-13 ENCOUNTER — Ambulatory Visit: Admission: RE | Admit: 2013-12-13 | Payer: Medicare Other | Source: Ambulatory Visit | Admitting: Radiation Oncology

## 2013-12-13 ENCOUNTER — Ambulatory Visit: Payer: Medicare Other

## 2013-12-16 ENCOUNTER — Ambulatory Visit: Payer: Medicare Other

## 2013-12-17 ENCOUNTER — Ambulatory Visit: Payer: Medicare Other

## 2013-12-18 ENCOUNTER — Ambulatory Visit: Payer: Medicare Other

## 2013-12-20 ENCOUNTER — Ambulatory Visit: Payer: Medicare Other

## 2013-12-23 ENCOUNTER — Ambulatory Visit: Payer: Medicare Other

## 2013-12-24 ENCOUNTER — Ambulatory Visit: Payer: Medicare Other

## 2013-12-24 ENCOUNTER — Other Ambulatory Visit: Payer: Self-pay | Admitting: Hematology and Oncology

## 2013-12-24 ENCOUNTER — Telehealth: Payer: Self-pay | Admitting: *Deleted

## 2013-12-24 ENCOUNTER — Ambulatory Visit (HOSPITAL_BASED_OUTPATIENT_CLINIC_OR_DEPARTMENT_OTHER): Payer: Medicare Other

## 2013-12-24 ENCOUNTER — Ambulatory Visit (HOSPITAL_BASED_OUTPATIENT_CLINIC_OR_DEPARTMENT_OTHER): Payer: Medicare Other | Admitting: Hematology and Oncology

## 2013-12-24 VITALS — BP 140/71 | HR 75 | Temp 96.9°F | Resp 20 | Ht 70.0 in | Wt 158.4 lb

## 2013-12-24 DIAGNOSIS — D649 Anemia, unspecified: Secondary | ICD-10-CM

## 2013-12-24 DIAGNOSIS — Z7901 Long term (current) use of anticoagulants: Secondary | ICD-10-CM

## 2013-12-24 DIAGNOSIS — R042 Hemoptysis: Secondary | ICD-10-CM

## 2013-12-24 DIAGNOSIS — I4891 Unspecified atrial fibrillation: Secondary | ICD-10-CM

## 2013-12-24 DIAGNOSIS — C8589 Other specified types of non-Hodgkin lymphoma, extranodal and solid organ sites: Secondary | ICD-10-CM

## 2013-12-24 DIAGNOSIS — C8330 Diffuse large B-cell lymphoma, unspecified site: Secondary | ICD-10-CM

## 2013-12-24 LAB — COMPREHENSIVE METABOLIC PANEL (CC13)
ALT: 17 U/L (ref 0–55)
AST: 20 U/L (ref 5–34)
Alkaline Phosphatase: 86 U/L (ref 40–150)
Anion Gap: 15 mEq/L — ABNORMAL HIGH (ref 3–11)
BUN: 30.5 mg/dL — ABNORMAL HIGH (ref 7.0–26.0)
CO2: 21 mEq/L — ABNORMAL LOW (ref 22–29)
Calcium: 9.5 mg/dL (ref 8.4–10.4)
Chloride: 108 mEq/L (ref 98–109)
Creatinine: 1.3 mg/dL (ref 0.7–1.3)
Glucose: 89 mg/dl (ref 70–140)
Potassium: 4.4 mEq/L (ref 3.5–5.1)
Total Protein: 6.7 g/dL (ref 6.4–8.3)

## 2013-12-24 LAB — CBC WITH DIFFERENTIAL/PLATELET
Basophils Absolute: 0 10*3/uL (ref 0.0–0.1)
EOS%: 2.7 % (ref 0.0–7.0)
Eosinophils Absolute: 0.1 10*3/uL (ref 0.0–0.5)
HGB: 11.4 g/dL — ABNORMAL LOW (ref 13.0–17.1)
LYMPH%: 14.9 % (ref 14.0–49.0)
MCH: 28.5 pg (ref 27.2–33.4)
MCV: 90.3 fL (ref 79.3–98.0)
MONO%: 9.4 % (ref 0.0–14.0)
NEUT#: 3.7 10*3/uL (ref 1.5–6.5)
NEUT%: 72.6 % (ref 39.0–75.0)
Platelets: 196 10*3/uL (ref 140–400)
lymph#: 0.8 10*3/uL — ABNORMAL LOW (ref 0.9–3.3)

## 2013-12-24 NOTE — Telephone Encounter (Signed)
Instructed sister to bring pt here for lab and office visit at 12:30 pm today.  She will try to convince pt to come in and will call us if she can't get him here.

## 2013-12-24 NOTE — Telephone Encounter (Signed)
Sister reports pt has been coughing up blood since yesterday.  States there is blood in his kleenex when he coughs and he has been coughing frequently past few days.  She is concerned and wants pt to go to ED but pt says he is "fine" and doesn't need to go to hospital.

## 2013-12-24 NOTE — Telephone Encounter (Signed)
Please bring him in today I have placed order for labs and appt to see me Plan to see him around 115 pm

## 2013-12-24 NOTE — Telephone Encounter (Signed)
Informed pt of INR result 3.11 and routed to Dr. Antoine Poche.  Pt verbalized understanding.

## 2013-12-25 ENCOUNTER — Encounter: Payer: Self-pay | Admitting: Hematology and Oncology

## 2013-12-25 ENCOUNTER — Ambulatory Visit: Payer: Medicare Other

## 2013-12-25 ENCOUNTER — Telehealth: Payer: Self-pay | Admitting: *Deleted

## 2013-12-25 ENCOUNTER — Telehealth: Payer: Self-pay | Admitting: Pharmacist

## 2013-12-25 DIAGNOSIS — R042 Hemoptysis: Secondary | ICD-10-CM

## 2013-12-25 HISTORY — DX: Hemoptysis: R04.2

## 2013-12-25 NOTE — Progress Notes (Signed)
[facility] OFFICE PROGRESS NOTE  Patient Care Team: [name], MD as PCP - General (Cardiology) Keturah Barre, MD as Attending Physician (Otolaryngology) Lonie Peak, MD as Attending Physician (Radiation Oncology) Crecencio Mc, MD as Attending Physician (Urology) Dennis Bast, RN as Registered Nurse Artis Delay, MD as Consulting Physician (Hematology and Oncology)  DIAGNOSIS: Urgent evaluation for recent hemoptysis  SUMMARY OF ONCOLOGIC HISTORY: This is a pleasant 79 year old gentleman with history of diffuse large B-cell lymphoma, status post chemotherapy. He is also on chronic anticoagulation therapy for history of atrial fibrillation  INTERVAL HISTORY: Gabriel Estrada 79 y.o. male returns for urgent evaluation. Family members call us informing me that he has been having hemoptysis. On further questioning, the patient recently choked on some pills and had forceful coughing for several times. He cough small amount of blood, quarter size according to the patient, for 4 times. His last hemoptysis was a day ago. The patient denies any recent signs or symptoms of bleeding such as spontaneous epistaxis, hematuria or hematochezia.  I have reviewed the past medical history, past surgical history, social history and family history with the patient and they are unchanged from previous note.  ALLERGIES:  has No Known Allergies.  MEDICATIONS:  Current Outpatient Prescriptions  Medication Sig Dispense Refill  . carvedilol (COREG) 6.25 MG tablet TAKE 1 TABLET BY MOUTH TWICE DAILY WITH MEALS  60 tablet  3  . digoxin (LANOXIN) 0.125 MG tablet Take 0.125 mg by mouth every evening.       . docusate sodium (COLACE) 100 MG capsule Take 1 capsule (100 mg total) by mouth 2 (two) times daily.  30 capsule  0  . ezetimibe-simvastatin (VYTORIN) 10-40 MG per tablet Take 1 tablet by mouth at bedtime.  30 tablet  4  . furosemide (LASIX) 20 MG tablet Take 20 mg by mouth daily as needed.       . lidocaine-prilocaine (EMLA) cream Apply topically as needed.  30 g  2  . Multiple Vitamin (MULTIVITAMIN WITH MINERALS) TABS Take 1 tablet by mouth daily.      . tamsulosin (FLOMAX) 0.4 MG CAPS Take 0.4 mg by mouth 1 day or 1 dose. bedtime      . warfarin (COUMADIN) 5 MG tablet Take 2.5-5 mg by mouth daily. Take 5mg  daily except 2.5mg  on Tuesdays and Thursdays       No current facility-administered medications for this visit.   Facility-Administered Medications Ordered in Other Visits  Medication Dose Route Frequency Provider Last Rate Last Dose  . sodium chloride 0.9 % injection 10 mL  10 mL Intracatheter PRN Artis Delay, MD   10 mL at 10/16/13 1550    REVIEW OF SYSTEMS:   Constitutional: Denies fevers, chills or abnormal weight loss All other systems were reviewed with the patient and are negative.  PHYSICAL EXAMINATION: ECOG PERFORMANCE STATUS: 1 - Symptomatic but completely ambulatory  Filed Vitals:   12/24/13 1236  BP: 140/71  Pulse: 75  Temp: 96.9 F (36.1 C)  Resp: 20   Filed Weights   12/24/13 1236  Weight: 158 lb 6.4 oz (71.85 kg)    GENERAL:alert, no distress and comfortable SKIN: skin color, texture, turgor are normal, no rashes or significant lesions EYES: normal, Conjunctiva are pink and non-injected, sclera clear OROPHARYNX:no exudate, no erythema and lips, buccal mucosa, and tongue normal  NECK: supple, thyroid normal size, non-tender, without nodularity LYMPH:  no palpable lymphadenopathy in the cervical, axillary or inguinal LUNGS: clear to auscultation and  percussion with normal breathing effort HEART: regular rate & rhythm and no murmurs and no lower extremity edema ABDOMEN:abdomen soft, non-tender and normal bowel sounds Musculoskeletal:no cyanosis of digits and no clubbing  NEURO: alert & oriented x 3 with fluent speech, no focal motor/sensory deficits  LABORATORY DATA:  I have reviewed the data as listed    Component Value Date/Time   NA 144  12/24/2013 1221   NA 139 08/02/2013 1621   K 4.4 12/24/2013 1221   K 4.5 08/02/2013 1621   CL 100 08/02/2013 1621   CL 104 05/29/2013 1328   CO2 21* 12/24/2013 1221   CO2 29 08/02/2013 1621   GLUCOSE 89 12/24/2013 1221   GLUCOSE 86 08/02/2013 1621   GLUCOSE 98 05/29/2013 1328   BUN 30.5* 12/24/2013 1221   BUN 40* 08/02/2013 1621   CREATININE 1.3 12/24/2013 1221   CREATININE 1.92* 08/02/2013 1621   CREATININE 2.2* 07/29/2013 0852   CALCIUM 9.5 12/24/2013 1221   CALCIUM 9.5 08/02/2013 1621   PROT 6.7 12/24/2013 1221   PROT 6.3 07/22/2013 1250   ALBUMIN 3.5 12/24/2013 1221   ALBUMIN 3.3* 07/22/2013 1250   AST 20 12/24/2013 1221   AST 32 07/22/2013 1250   ALT 17 12/24/2013 1221   ALT 37 07/22/2013 1250   ALKPHOS 86 12/24/2013 1221   ALKPHOS 69 07/22/2013 1250   BILITOT 0.95 12/24/2013 1221   BILITOT 1.1 07/22/2013 1250   GFRNONAA 29* 07/26/2013 0607   GFRAA 34* 07/26/2013 0607    No results found for this basename: SPEP, UPEP,  kappa and lambda light chains    Lab Results  Component Value Date   WBC 5.1 12/24/2013   NEUTROABS 3.7 12/24/2013   HGB 11.4* 12/24/2013   HCT 36.1* 12/24/2013   MCV 90.3 12/24/2013   PLT 196 12/24/2013      Chemistry      Component Value Date/Time   NA 144 12/24/2013 1221   NA 139 08/02/2013 1621   K 4.4 12/24/2013 1221   K 4.5 08/02/2013 1621   CL 100 08/02/2013 1621   CL 104 05/29/2013 1328   CO2 21* 12/24/2013 1221   CO2 29 08/02/2013 1621   BUN 30.5* 12/24/2013 1221   BUN 40* 08/02/2013 1621   CREATININE 1.3 12/24/2013 1221   CREATININE 1.92* 08/02/2013 1621   CREATININE 2.2* 07/29/2013 0852      Component Value Date/Time   CALCIUM 9.5 12/24/2013 1221   CALCIUM 9.5 08/02/2013 1621   ALKPHOS 86 12/24/2013 1221   ALKPHOS 69 07/22/2013 1250   AST 20 12/24/2013 1221   AST 32 07/22/2013 1250   ALT 17 12/24/2013 1221   ALT 37 07/22/2013 1250   BILITOT 0.95 12/24/2013 1221   BILITOT 1.1 07/22/2013 1250     ASSESSMENT & PLAN:  #1 hemoptysis This is likely due to mild  mucosal tear from forceful coughing. This has resolved. Stat INR in my office showed it was mildly elevated at 3.1. I reassured the patient and family members. We will observe. #2 chronic anticoagulation therapy secondary to atrial fibrillation INR is mildly elevated at 3.1. I will call his physician office for further anticoagulation management #3 history of diffuse B cell lymphoma The patient is currently undergoing evaluation for consolidation radiation therapy. #4 anemia This is likely anemia of chronic disease. The patient denies recent history of bleeding such as epistaxis, hematuria or hematochezia. He is asymptomatic from the anemia. We will observe for now.  He does not require transfusion now.  All questions were answered. The patient knows to call the clinic with any problems, questions or concerns. No barriers to learning was detected. I spent 15 minutes counseling the patient face to face. The total time spent in the appointment was 20 minutes and more than 50% was on counseling and review of test results     [facility], Luetta Piazza, MD 12/25/2013 7:53 AM

## 2013-12-25 NOTE — Telephone Encounter (Signed)
INR was 3.11 yesterday with Dr. Bertis Ruddy who patient saw for recent hemoptysis.  The bleeding has resolved, and Dr. Bertis Ruddy felt the bleeding was due to patient choking on a pill and was just a transient problem, and doesn't feel further work up is necessary.  Patient to continue warfarin.  I spoke with his sister who helps patient with his medication, and informed her to only give him warfarin 2.5 mg tonight (Wednesday) instead of 5 mg, and have him follow up with [place] in 1 week as scheduled.  She repeated instructions back to me to confirm understanding.

## 2013-12-25 NOTE — Telephone Encounter (Signed)
VM from pt's sister, Jerrye Beavers,  States she has not heard results of pt's lab work from yesterday.  She would like to know pt's INR and if any adjustments to coumadin needed?  Called pt's coumadin clinic and s/w Riki Rusk.  He will contact sister w/ INR result and any new instructions.

## 2013-12-27 ENCOUNTER — Ambulatory Visit: Payer: Medicare Other

## 2013-12-30 ENCOUNTER — Ambulatory Visit (HOSPITAL_COMMUNITY): Payer: Self-pay | Admitting: Dentistry

## 2013-12-30 ENCOUNTER — Ambulatory Visit: Payer: Medicare Other

## 2013-12-30 ENCOUNTER — Ambulatory Visit
Admission: RE | Admit: 2013-12-30 | Discharge: 2013-12-30 | Disposition: A | Discharge status: Home / Self Care | Payer: Medicare Other | Source: Ambulatory Visit | Attending: Radiation Oncology | Admitting: Radiation Oncology

## 2013-12-30 ENCOUNTER — Encounter (HOSPITAL_COMMUNITY): Payer: Self-pay | Admitting: Dentistry

## 2013-12-30 ENCOUNTER — Encounter: Payer: Self-pay | Admitting: *Deleted

## 2013-12-30 VITALS — BP 151/83 | HR 77 | Temp 97.7°F

## 2013-12-30 DIAGNOSIS — K0820 Unspecified atrophy of edentulous alveolar ridge: Secondary | ICD-10-CM

## 2013-12-30 DIAGNOSIS — K08109 Complete loss of teeth, unspecified cause, unspecified class: Secondary | ICD-10-CM

## 2013-12-30 DIAGNOSIS — Z463 Encounter for fitting and adjustment of dental prosthetic device: Secondary | ICD-10-CM

## 2013-12-30 DIAGNOSIS — K000 Anodontia: Principal | ICD-10-CM

## 2013-12-30 NOTE — Progress Notes (Signed)
12/30/2013  Patient:            Gabriel Estrada Date of Birth:  03/10/1928 MRN:                [id number]  BP 151/83  Pulse 77  Temp(Src) 97.7 F (36.5 C) (Oral)  Gabriel Estrada presents for evaluation of recently inserted upper and lower complete dentures. SUBJECTIVE: Patient denies having any denture irritation. OBJECTIVE: There is no sign of denture irritation or erythema. Procedure: Pressure indicating paste was applied to the dentures. Adjustments were made as needed. [place] Occlusion evaluated and adjustments made as needed for Centric Relation and protrusive strokes. Patient accepts results. Patient to keep dentures out if sore spots develop. Use salt water rinses as needed to aid healing. Return to clinic as scheduled for denture adjustment.  Call if problems arise before then. Patient dismissed in stable condition.  Lenn Cal, DDS

## 2013-12-30 NOTE — Patient Instructions (Signed)
Patient to keep dentures out if sore spots develop. Use salt water rinses as needed to aid healing. Return to clinic as scheduled for denture adjustment.  Call if problems arise before then.

## 2013-12-30 NOTE — Progress Notes (Signed)
Met with patient prior to [facility] RT for nasal mass.  He expressed no concerns/complaints.  Initiating navigation as L1 (new) patient with this encounter.  Gayleen Orem, RN, BSN, [facility]ck Oncology Navigator [phone]

## 2013-12-30 NOTE — Telephone Encounter (Signed)
Labs OK.  Call Gabriel Estrada with the results

## 2013-12-31 ENCOUNTER — Ambulatory Visit
Admission: RE | Admit: 2013-12-31 | Discharge: 2013-12-31 | Disposition: A | Discharge status: Home / Self Care | Payer: Medicare Other | Source: Ambulatory Visit | Attending: Radiation Oncology | Admitting: Radiation Oncology

## 2013-12-31 ENCOUNTER — Other Ambulatory Visit: Payer: Self-pay | Admitting: Dermatology

## 2013-12-31 VITALS — Wt 157.7 lb

## 2013-12-31 DIAGNOSIS — C8589 Other specified types of non-Hodgkin lymphoma, extranodal and solid organ sites: Secondary | ICD-10-CM

## 2013-12-31 MED ORDER — BIAFINE EX EMUL
CUTANEOUS | Status: DC | PRN
  Administered 2013-12-31: 14:00:00 via TOPICAL

## 2013-12-31 NOTE — Progress Notes (Addendum)
patient education done, rad book, biafine cream with instructions of use of cream to apply to affected area daily after rad tx and on weekends as well, discussed fatigue,skin irritation,  Pain, loss wight, mouth and throat changes, loss taste, thick saliva, drink plenty water, may need to do to 5-6 small soft meals, diet,increase protein calories, will se MD weekly and prn, gave soft tooth brush also along with biotene pamphlett, and my business card,tech back given by patient 2:07 PM

## 2014-01-01 ENCOUNTER — Ambulatory Visit
Admission: RE | Admit: 2014-01-01 | Discharge: 2014-01-01 | Disposition: A | Discharge status: Home / Self Care | Payer: Medicare Other | Source: Ambulatory Visit | Attending: Radiation Oncology | Admitting: Radiation Oncology

## 2014-01-01 ENCOUNTER — Ambulatory Visit (INDEPENDENT_AMBULATORY_CARE_PROVIDER_SITE_OTHER): Payer: Medicare Other | Admitting: *Deleted

## 2014-01-01 DIAGNOSIS — I4891 Unspecified atrial fibrillation: Secondary | ICD-10-CM

## 2014-01-01 DIAGNOSIS — Z7901 Long term (current) use of anticoagulants: Secondary | ICD-10-CM

## 2014-01-01 LAB — POCT INR: INR: 2.6

## 2014-01-02 ENCOUNTER — Ambulatory Visit
Admission: RE | Admit: 2014-01-02 | Discharge: 2014-01-02 | Disposition: A | Discharge status: Home / Self Care | Payer: Medicare Other | Source: Ambulatory Visit | Attending: Radiation Oncology | Admitting: Radiation Oncology

## 2014-01-03 ENCOUNTER — Ambulatory Visit
Admission: RE | Admit: 2014-01-03 | Discharge: 2014-01-03 | Disposition: A | Discharge status: Home / Self Care | Payer: Medicare Other | Source: Ambulatory Visit | Attending: Radiation Oncology | Admitting: Radiation Oncology

## 2014-01-03 ENCOUNTER — Ambulatory Visit: Payer: Medicare Other | Admitting: Radiation Oncology

## 2014-01-03 ENCOUNTER — Encounter: Payer: Self-pay | Admitting: Radiation Oncology

## 2014-01-03 VITALS — BP 118/67 | HR 65 | Temp 97.0°F | Resp 20 | Wt 159.9 lb

## 2014-01-03 DIAGNOSIS — C8330 Diffuse large B-cell lymphoma, unspecified site: Secondary | ICD-10-CM

## 2014-01-03 NOTE — Progress Notes (Signed)
Pt denies pain, fatigue, loss of appetite. He is applying Biafine to nose, no skin changes at this time.

## 2014-01-03 NOTE — Progress Notes (Signed)
   Department of Radiation Oncology  Phone:  [phone] Fax:        [phone]  Weekly Treatment Note    Name: Gabriel Estrada Date: 01/03/2014 MRN: [id number] DOB: 01/04/1928   Current dose: 7.2 Gy  Current fraction: 4   MEDICATIONS: Current Outpatient Prescriptions  Medication Sig Dispense Refill  . carvedilol (COREG) 6.25 MG tablet TAKE 1 TABLET BY MOUTH TWICE DAILY WITH MEALS  60 tablet  3  . digoxin (LANOXIN) 0.125 MG tablet Take 0.125 mg by mouth every evening.       . docusate sodium (COLACE) 100 MG capsule Take 1 capsule (100 mg total) by mouth 2 (two) times daily.  30 capsule  0  . emollient (BIAFINE) cream Apply 1 application topically daily. Apply to affected skin area after rad txs daily      . ezetimibe-simvastatin (VYTORIN) 10-40 MG per tablet Take 1 tablet by mouth at bedtime.  30 tablet  4  . furosemide (LASIX) 20 MG tablet Take 20 mg by mouth daily as needed.      . lidocaine-prilocaine (EMLA) cream Apply topically as needed.  30 g  2  . Multiple Vitamin (MULTIVITAMIN WITH MINERALS) TABS Take 1 tablet by mouth daily.      . tamsulosin (FLOMAX) 0.4 MG CAPS Take 0.4 mg by mouth 1 day or 1 dose. bedtime      . warfarin (COUMADIN) 5 MG tablet Take 2.5-5 mg by mouth daily. Take 5mg  daily except 2.5mg  on Tuesdays and Thursdays       No current facility-administered medications for this encounter.   Facility-Administered Medications Ordered in Other Encounters  Medication Dose Route Frequency Provider Last Rate Last Dose  . sodium chloride 0.9 % injection 10 mL  10 mL Intracatheter PRN Heath Lark, MD   10 mL at 10/16/13 1550     ALLERGIES: Review of patient's allergies indicates not on file.   LABORATORY DATA:  Lab Results  Component Value Date   WBC 5.1 12/24/2013   HGB 11.4* 12/24/2013   HCT 36.1* 12/24/2013   MCV 90.3 12/24/2013   PLT 196 12/24/2013   Lab Results  Component Value Date   NA 144 12/24/2013   K 4.4 12/24/2013   CL 100 08/02/2013   CO2  21* 12/24/2013   Lab Results  Component Value Date   ALT 17 12/24/2013   AST 20 12/24/2013   ALKPHOS 86 12/24/2013   BILITOT 0.95 12/24/2013     NARRATIVE: Gabriel Estrada was seen today for weekly treatment management. The chart was checked and the patient's films were reviewed. The patient is doing well with treatment he states in his first week. He denies any pain or fatigue. No skin irritation.  PHYSICAL EXAMINATION: weight is 159 lb 14.4 oz (72.53 kg). His temperature is 97 F (36.1 C). His blood pressure is 118/67 and his pulse is 65. His respiration is 20.      no skin change  ASSESSMENT: The patient is doing satisfactorily with treatment.  PLAN: We will continue with the patient's radiation treatment as planned.

## 2014-01-06 ENCOUNTER — Ambulatory Visit: Payer: Medicare Other | Admitting: Radiation Oncology

## 2014-01-06 ENCOUNTER — Encounter: Payer: Self-pay | Admitting: Internal Medicine

## 2014-01-06 ENCOUNTER — Telehealth: Payer: Self-pay | Admitting: *Deleted

## 2014-01-06 ENCOUNTER — Other Ambulatory Visit: Payer: Self-pay | Admitting: Cardiology

## 2014-01-06 ENCOUNTER — Ambulatory Visit: Payer: Medicare Other

## 2014-01-06 ENCOUNTER — Ambulatory Visit (INDEPENDENT_AMBULATORY_CARE_PROVIDER_SITE_OTHER): Payer: Medicare Other | Admitting: Internal Medicine

## 2014-01-06 VITALS — BP 92/54 | HR 60 | Resp 16 | Ht 72.0 in | Wt 158.0 lb

## 2014-01-06 DIAGNOSIS — I4891 Unspecified atrial fibrillation: Secondary | ICD-10-CM

## 2014-01-06 DIAGNOSIS — N189 Chronic kidney disease, unspecified: Secondary | ICD-10-CM

## 2014-01-06 DIAGNOSIS — C8589 Other specified types of non-Hodgkin lymphoma, extranodal and solid organ sites: Secondary | ICD-10-CM

## 2014-01-06 DIAGNOSIS — E782 Mixed hyperlipidemia: Secondary | ICD-10-CM

## 2014-01-06 DIAGNOSIS — R29898 Other symptoms and signs involving the musculoskeletal system: Secondary | ICD-10-CM

## 2014-01-06 DIAGNOSIS — F17200 Nicotine dependence, unspecified, uncomplicated: Secondary | ICD-10-CM

## 2014-01-06 DIAGNOSIS — I2510 Atherosclerotic heart disease of native coronary artery without angina pectoris: Secondary | ICD-10-CM

## 2014-01-06 MED ORDER — VITAMIN D 1000 UNITS PO TABS: 1000.0000 [IU] | ORAL_TABLET | Freq: Every day | ORAL | Status: AC

## 2014-01-06 NOTE — Progress Notes (Signed)
Pre visit review using our clinic review tool, if applicable. No additional management support is needed unless otherwise documented below in the visit note. 

## 2014-01-06 NOTE — Assessment & Plan Note (Signed)
Chronic. 

## 2014-01-06 NOTE — Telephone Encounter (Signed)
called patient at home [phone], spoke with Gabriel Estrada asking why he has decided to stop rad txs, only has had 5 so far"I'ts making me too weak", asked if he would be willing to come in Wednesday and discuss this with Dr.Moody "No, I don't see the need, I'm done" I couldn't get him to agree to come in and discuss with MD, informed him I would let Dr.moody know and that MD may give him a call as well 1:17 PM

## 2014-01-06 NOTE — Assessment & Plan Note (Signed)
Continue with current prescription therapy as reflected on the Med list.  

## 2014-01-06 NOTE — Progress Notes (Signed)
Subjective:    HPI  New pt. We need to address problems:  #1 recent hemoptysis  This is likely due to mild mucosal tear from forceful coughing. This has resolved.  #2 chronic anticoagulation therapy secondary to atrial fibrillation  #3 history of diffuse B cell lymphoma  The patient is currently undergoing evaluation for consolidation radiation therapy.  #4 anemia  This is likely anemia of chronic disease. The patient denies recent history of bleeding such as epistaxis, hematuria or hematochezia. He is asymptomatic from the anemia. We will observe for now. He does not require transfusion now.  All questions were answered. The patient knows to call the clinic with any problems, questions or concerns. No barriers to learning was detected.      Review of Systems  Constitutional: Positive for fatigue. Negative for appetite change and unexpected weight change.  HENT: Negative for congestion, nosebleeds, sneezing, sore throat, trouble swallowing and voice change.   Eyes: Negative for itching and visual disturbance.  Respiratory: Positive for shortness of breath. Negative for cough and wheezing.   Cardiovascular: Negative for chest pain, palpitations and leg swelling.  Gastrointestinal: Negative for nausea, diarrhea, blood in stool and abdominal distention.  Genitourinary: Negative for frequency and hematuria.  Musculoskeletal: Negative for back pain, gait problem, joint swelling and neck pain.  Skin: Negative for pallor and rash.  Neurological: Positive for weakness and light-headedness. Negative for dizziness, tremors, syncope and speech difficulty.  Psychiatric/Behavioral: Negative for suicidal ideas, behavioral problems, sleep disturbance, dysphoric mood, decreased concentration and agitation. The patient is not nervous/anxious.    Wt Readings from Last 3 Encounters:  01/06/14 158 lb (71.668 kg)  01/03/14 159 lb 14.4 oz (72.53 kg)  12/31/13 157 lb 11.2 oz (71.532 kg)   BP  Readings from Last 3 Encounters:  01/06/14 92/54  01/03/14 118/67  12/30/13 151/83        Objective:   Physical Exam  Constitutional: He is oriented to person, place, and time. He appears well-developed. No distress.  Thin NAD  HENT:  Mouth/Throat: Oropharynx is clear and moist.  Eyes: Conjunctivae are normal. Pupils are equal, round, and reactive to light.  Neck: Normal range of motion. No JVD present. No thyromegaly present.  Cardiovascular: Normal rate, regular rhythm, normal heart sounds and intact distal pulses.  Exam reveals no gallop and no friction rub.   No murmur heard. Pulmonary/Chest: Effort normal and breath sounds normal. No respiratory distress. He has no wheezes. He has no rales. He exhibits no tenderness.  Abdominal: Soft. Bowel sounds are normal. He exhibits no distension and no mass. There is no tenderness. There is no rebound and no guarding.  Musculoskeletal: Normal range of motion. He exhibits tenderness (LS spine). He exhibits no edema.  Lymphadenopathy:    He has no cervical adenopathy.  Neurological: He is alert and oriented to person, place, and time. He has normal reflexes. No cranial nerve deficit. He exhibits normal muscle tone. He displays a negative Romberg sign. Coordination abnormal. Gait normal.  Ataxic Weak LEs No meningeal signs  Skin: Skin is warm and dry. No rash noted.  Psychiatric: He has a normal mood and affect. His behavior is normal. Judgment and thought content normal.      Lab Results  Component Value Date   WBC 5.1 12/24/2013   HGB 11.4* 12/24/2013   HCT 36.1* 12/24/2013   PLT 196 12/24/2013   GLUCOSE 89 12/24/2013   CHOL 124 10/11/2012   TRIG 88.0 10/11/2012   HDL 51.70  10/11/2012   LDLCALC 55 10/11/2012   ALT 17 12/24/2013   AST 20 12/24/2013   NA 144 12/24/2013   K 4.4 12/24/2013   CL 100 08/02/2013   CREATININE 1.3 12/24/2013   BUN 30.5* 12/24/2013   CO2 21* 12/24/2013   TSH 2.815 07/22/2013   INR 2.6 01/01/2014        Assessment & Plan:

## 2014-01-06 NOTE — Assessment & Plan Note (Signed)
?   etiology 

## 2014-01-06 NOTE — Assessment & Plan Note (Signed)
2014 XRT - sinuses, chemo

## 2014-01-06 NOTE — Assessment & Plan Note (Signed)
Quit in 2004

## 2014-01-06 NOTE — Telephone Encounter (Signed)
error 

## 2014-01-07 ENCOUNTER — Ambulatory Visit: Payer: Medicare Other

## 2014-01-08 ENCOUNTER — Ambulatory Visit: Admission: RE | Admit: 2014-01-08 | Payer: Medicare Other | Source: Ambulatory Visit

## 2014-01-09 ENCOUNTER — Ambulatory Visit: Payer: Medicare Other

## 2014-01-09 ENCOUNTER — Other Ambulatory Visit: Payer: Self-pay | Admitting: Cardiology

## 2014-01-10 ENCOUNTER — Encounter: Payer: Self-pay | Admitting: Radiation Oncology

## 2014-01-10 ENCOUNTER — Ambulatory Visit: Payer: Medicare Other

## 2014-01-13 ENCOUNTER — Ambulatory Visit: Payer: Medicare Other

## 2014-01-13 ENCOUNTER — Other Ambulatory Visit: Payer: Self-pay

## 2014-01-13 MED ORDER — DIGOXIN 125 MCG PO TABS: 0.1250 mg | ORAL_TABLET | Freq: Every evening | ORAL | Status: DC

## 2014-01-14 ENCOUNTER — Ambulatory Visit: Payer: Medicare Other

## 2014-01-14 ENCOUNTER — Encounter (HOSPITAL_COMMUNITY): Payer: Self-pay | Admitting: Dentistry

## 2014-01-15 ENCOUNTER — Ambulatory Visit: Payer: Medicare Other

## 2014-01-15 ENCOUNTER — Encounter

## 2014-01-15 NOTE — Progress Notes (Signed)
  Radiation Oncology         [phone] ________________________________  Name: Gabriel Estrada MRN: [id number]  Date: 01/10/2014  DOB: 02/27/1928  End of Treatment Note  Diagnosis:   Diffuse large B cell lymphoma      Indication for treatment:  curative       Radiation treatment dates:   12/30/13 - 01/03/14  Site/dose:   The patient was treated to the nasal cavity and surrounding high risk regions. He received an IMRT treatment using daily image guidance. The patient was planned to receive 30.6 Gy. The patient stopped treatment after 5 fractions/ 9 Gy.  Narrative: The patient appeared to tolerate radiation satisfactorily. He however felt fatigued with treatment, and he himself did not feel that he was tolerating treatment well despite encouragement.  Plan: The patient has completed radiation treatment. The patient will return to radiation oncology clinic for routine followup in one month. I advised the patient to call or return sooner if they have any questions or concerns related to their recovery or treatment. ________________________________  Jodelle Gross, M.D., Ph.D.

## 2014-01-16 ENCOUNTER — Ambulatory Visit: Payer: Medicare Other

## 2014-01-17 ENCOUNTER — Ambulatory Visit: Payer: Medicare Other

## 2014-01-20 ENCOUNTER — Ambulatory Visit: Payer: Medicare Other

## 2014-01-20 ENCOUNTER — Ambulatory Visit: Payer: Medicare Other | Admitting: Radiation Oncology

## 2014-01-20 ENCOUNTER — Other Ambulatory Visit: Payer: Self-pay | Admitting: Hematology and Oncology

## 2014-01-20 DIAGNOSIS — C8330 Diffuse large B-cell lymphoma, unspecified site: Secondary | ICD-10-CM

## 2014-01-21 ENCOUNTER — Other Ambulatory Visit: Payer: Medicare Other

## 2014-01-21 ENCOUNTER — Other Ambulatory Visit: Payer: Self-pay | Admitting: Hematology and Oncology

## 2014-01-21 ENCOUNTER — Ambulatory Visit: Payer: Medicare Other | Admitting: Hematology and Oncology

## 2014-01-21 ENCOUNTER — Telehealth: Payer: Self-pay | Admitting: *Deleted

## 2014-01-21 ENCOUNTER — Ambulatory Visit: Payer: Medicare Other | Admitting: Radiation Oncology

## 2014-01-21 DIAGNOSIS — C8330 Diffuse large B-cell lymphoma, unspecified site: Secondary | ICD-10-CM

## 2014-01-21 NOTE — Telephone Encounter (Signed)
I placed an order for port removal When you call him, ask if he wants to reschedule his appointment Recommend see him back in 3 months

## 2014-01-21 NOTE — Telephone Encounter (Signed)
Per Scheduler,  a VM was left asking for removal of port a cath.  Scheduler asking if pt's missed office visit/lab from today needs to be re scheduled first?

## 2014-01-22 ENCOUNTER — Emergency Department (HOSPITAL_COMMUNITY): Payer: Medicare Other

## 2014-01-22 ENCOUNTER — Inpatient Hospital Stay (HOSPITAL_COMMUNITY)
Admission: EM | Admit: 2014-01-22 | Discharge: 2014-01-27 | DRG: 291 | Disposition: A | Discharge status: Home Health | Payer: Medicare Other | Attending: Internal Medicine | Admitting: Internal Medicine

## 2014-01-22 ENCOUNTER — Encounter (HOSPITAL_COMMUNITY): Payer: Self-pay | Admitting: Emergency Medicine

## 2014-01-22 ENCOUNTER — Ambulatory Visit: Payer: Medicare Other

## 2014-01-22 DIAGNOSIS — I252 Old myocardial infarction: Secondary | ICD-10-CM

## 2014-01-22 DIAGNOSIS — I2789 Other specified pulmonary heart diseases: Secondary | ICD-10-CM | POA: Diagnosis present

## 2014-01-22 DIAGNOSIS — N179 Acute kidney failure, unspecified: Secondary | ICD-10-CM | POA: Diagnosis present

## 2014-01-22 DIAGNOSIS — R5381 Other malaise: Secondary | ICD-10-CM | POA: Diagnosis present

## 2014-01-22 DIAGNOSIS — N189 Chronic kidney disease, unspecified: Secondary | ICD-10-CM

## 2014-01-22 DIAGNOSIS — C8589 Other specified types of non-Hodgkin lymphoma, extranodal and solid organ sites: Secondary | ICD-10-CM | POA: Diagnosis present

## 2014-01-22 DIAGNOSIS — J9601 Acute respiratory failure with hypoxia: Secondary | ICD-10-CM | POA: Diagnosis present

## 2014-01-22 DIAGNOSIS — N183 Chronic kidney disease, stage 3 unspecified: Secondary | ICD-10-CM | POA: Diagnosis present

## 2014-01-22 DIAGNOSIS — I2589 Other forms of chronic ischemic heart disease: Secondary | ICD-10-CM | POA: Diagnosis present

## 2014-01-22 DIAGNOSIS — D899 Disorder involving the immune mechanism, unspecified: Secondary | ICD-10-CM | POA: Diagnosis present

## 2014-01-22 DIAGNOSIS — J189 Pneumonia, unspecified organism: Secondary | ICD-10-CM | POA: Diagnosis present

## 2014-01-22 DIAGNOSIS — E782 Mixed hyperlipidemia: Secondary | ICD-10-CM | POA: Diagnosis present

## 2014-01-22 DIAGNOSIS — Z87891 Personal history of nicotine dependence: Secondary | ICD-10-CM

## 2014-01-22 DIAGNOSIS — I2510 Atherosclerotic heart disease of native coronary artery without angina pectoris: Secondary | ICD-10-CM

## 2014-01-22 DIAGNOSIS — Z7901 Long term (current) use of anticoagulants: Secondary | ICD-10-CM

## 2014-01-22 DIAGNOSIS — I4891 Unspecified atrial fibrillation: Secondary | ICD-10-CM | POA: Diagnosis present

## 2014-01-22 DIAGNOSIS — I739 Peripheral vascular disease, unspecified: Secondary | ICD-10-CM | POA: Diagnosis present

## 2014-01-22 DIAGNOSIS — I5043 Acute on chronic combined systolic (congestive) and diastolic (congestive) heart failure: Principal | ICD-10-CM

## 2014-01-22 DIAGNOSIS — C8330 Diffuse large B-cell lymphoma, unspecified site: Secondary | ICD-10-CM | POA: Diagnosis present

## 2014-01-22 DIAGNOSIS — J9600 Acute respiratory failure, unspecified whether with hypoxia or hypercapnia: Secondary | ICD-10-CM | POA: Diagnosis present

## 2014-01-22 DIAGNOSIS — N184 Chronic kidney disease, stage 4 (severe): Secondary | ICD-10-CM | POA: Diagnosis present

## 2014-01-22 DIAGNOSIS — I129 Hypertensive chronic kidney disease with stage 1 through stage 4 chronic kidney disease, or unspecified chronic kidney disease: Secondary | ICD-10-CM | POA: Diagnosis present

## 2014-01-22 DIAGNOSIS — D61818 Other pancytopenia: Secondary | ICD-10-CM | POA: Diagnosis present

## 2014-01-22 DIAGNOSIS — I255 Ischemic cardiomyopathy: Secondary | ICD-10-CM | POA: Diagnosis present

## 2014-01-22 DIAGNOSIS — I5023 Acute on chronic systolic (congestive) heart failure: Secondary | ICD-10-CM

## 2014-01-22 DIAGNOSIS — Z951 Presence of aortocoronary bypass graft: Secondary | ICD-10-CM

## 2014-01-22 DIAGNOSIS — I509 Heart failure, unspecified: Secondary | ICD-10-CM | POA: Diagnosis present

## 2014-01-22 LAB — CBC
HCT: 41.8 % (ref 39.0–52.0)
Hemoglobin: 13.6 g/dL (ref 13.0–17.0)
MCH: 30 pg (ref 26.0–34.0)
MCHC: 32.5 g/dL (ref 30.0–36.0)
MCV: 92.1 fL (ref 78.0–100.0)
Platelets: 184 10*3/uL (ref 150–400)
RBC: 4.54 MIL/uL (ref 4.22–5.81)
RDW: 15.7 % — AB (ref 11.5–15.5)
WBC: 2.4 10*3/uL — ABNORMAL LOW (ref 4.0–10.5)

## 2014-01-22 LAB — URINE MICROSCOPIC-ADD ON

## 2014-01-22 LAB — BASIC METABOLIC PANEL
BUN: 35 mg/dL — ABNORMAL HIGH (ref 6–23)
CALCIUM: 8.9 mg/dL (ref 8.4–10.5)
CO2: 25 mEq/L (ref 19–32)
Chloride: 101 mEq/L (ref 96–112)
Creatinine, Ser: 1.53 mg/dL — ABNORMAL HIGH (ref 0.50–1.35)
GFR, EST AFRICAN AMERICAN: 47 mL/min — AB (ref >90)
GFR, EST NON AFRICAN AMERICAN: 40 mL/min — AB (ref >90)
Glucose, Bld: 154 mg/dL — ABNORMAL HIGH (ref 70–99)
Potassium: 4.9 mEq/L (ref 3.7–5.3)
SODIUM: 142 meq/L (ref 137–147)

## 2014-01-22 LAB — POCT I-STAT 3, ART BLOOD GAS (G3+)
BICARBONATE: 26.5 meq/L — AB (ref 20.0–24.0)
O2 SAT: 77 %
PCO2 ART: 50.1 mmHg — AB (ref 35.0–45.0)
PO2 ART: 45 mmHg — AB (ref 80.0–100.0)
TCO2: 28 mmol/L (ref 0–100)
pH, Arterial: 7.332 — ABNORMAL LOW (ref 7.350–7.450)

## 2014-01-22 LAB — POCT I-STAT TROPONIN I: TROPONIN I, POC: 0.03 ng/mL (ref 0.00–0.08)

## 2014-01-22 LAB — PROTIME-INR
INR: 3.43 — ABNORMAL HIGH (ref 0.00–1.49)
PROTHROMBIN TIME: 33.3 s — AB (ref 11.6–15.2)

## 2014-01-22 LAB — URINALYSIS, ROUTINE W REFLEX MICROSCOPIC
BILIRUBIN URINE: NEGATIVE
Glucose, UA: NEGATIVE mg/dL
Hgb urine dipstick: NEGATIVE
KETONES UR: 15 mg/dL — AB
NITRITE: NEGATIVE
PH: 5 (ref 5.0–8.0)
Protein, ur: 30 mg/dL — AB
Specific Gravity, Urine: 1.031 — ABNORMAL HIGH (ref 1.005–1.030)
Urobilinogen, UA: 0.2 mg/dL (ref 0.0–1.0)

## 2014-01-22 LAB — TROPONIN I: Troponin I: <0.3 ng/mL (ref <0.30)

## 2014-01-22 LAB — LACTIC ACID, PLASMA: LACTIC ACID, VENOUS: 1.4 mmol/L (ref 0.5–2.2)

## 2014-01-22 LAB — MRSA PCR SCREENING: MRSA by PCR: NEGATIVE

## 2014-01-22 LAB — DIGOXIN LEVEL: Digoxin Level: 0.5 ng/mL — ABNORMAL LOW (ref 0.8–2.0)

## 2014-01-22 LAB — PRO B NATRIURETIC PEPTIDE: Pro B Natriuretic peptide (BNP): 4885 pg/mL — ABNORMAL HIGH (ref 0–450)

## 2014-01-22 MED ORDER — IPRATROPIUM BROMIDE 0.02 % IN SOLN
0.5000 mg | Freq: Once | RESPIRATORY_TRACT | Status: AC
  Administered 2014-01-22: 0.5 mg via RESPIRATORY_TRACT
  Filled 2014-01-22: qty 2.5

## 2014-01-22 MED ORDER — IPRATROPIUM BROMIDE 0.02 % IN SOLN
0.5000 mg | RESPIRATORY_TRACT | Status: DC | PRN
  Filled 2014-01-22: qty 2.5

## 2014-01-22 MED ORDER — LEVALBUTEROL HCL 1.25 MG/0.5ML IN NEBU
1.2500 mg | INHALATION_SOLUTION | RESPIRATORY_TRACT | Status: DC | PRN
  Filled 2014-01-22: qty 0.5

## 2014-01-22 MED ORDER — DEXTROSE 5 % IV SOLN
500.0000 mg | Freq: Once | INTRAVENOUS | Status: AC
  Administered 2014-01-22: 500 mg via INTRAVENOUS

## 2014-01-22 MED ORDER — ONDANSETRON HCL 4 MG/2ML IJ SOLN: 4.0000 mg | Freq: Four times a day (QID) | INTRAMUSCULAR | Status: DC | PRN

## 2014-01-22 MED ORDER — ENOXAPARIN SODIUM 40 MG/0.4ML ~~LOC~~ SOLN: 40.0000 mg | SUBCUTANEOUS | Status: DC

## 2014-01-22 MED ORDER — DEXTROSE 5 % IV SOLN
2.0000 g | INTRAVENOUS | Status: DC
  Administered 2014-01-23 – 2014-01-26 (×5): 2 g via INTRAVENOUS
  Filled 2014-01-22 (×7): qty 2

## 2014-01-22 MED ORDER — IPRATROPIUM BROMIDE 0.02 % IN SOLN
0.5000 mg | RESPIRATORY_TRACT | Status: DC | PRN
  Administered 2014-01-23: 0.5 mg via RESPIRATORY_TRACT

## 2014-01-22 MED ORDER — ALBUTEROL SULFATE (2.5 MG/3ML) 0.083% IN NEBU
5.0000 mg | INHALATION_SOLUTION | Freq: Once | RESPIRATORY_TRACT | Status: AC
  Administered 2014-01-22: 5 mg via RESPIRATORY_TRACT
  Filled 2014-01-22: qty 6

## 2014-01-22 MED ORDER — SODIUM CHLORIDE 0.9 % IV SOLN
INTRAVENOUS | Status: DC
Start: 2014-01-22 — End: 2014-01-22

## 2014-01-22 MED ORDER — SODIUM CHLORIDE 0.9 % IV SOLN
INTRAVENOUS | Status: AC
  Administered 2014-01-22: 22:00:00 via INTRAVENOUS

## 2014-01-22 MED ORDER — DILTIAZEM HCL 100 MG IV SOLR
5.0000 mg/h | INTRAVENOUS | Status: DC
  Administered 2014-01-22: 10 mg/h via INTRAVENOUS

## 2014-01-22 MED ORDER — DEXTROSE 5 % IV SOLN: 1.0000 g | Freq: Three times a day (TID) | INTRAVENOUS | Status: DC

## 2014-01-22 MED ORDER — OXYCODONE-ACETAMINOPHEN 5-325 MG PO TABS: 1.0000 | ORAL_TABLET | ORAL | Status: DC | PRN

## 2014-01-22 MED ORDER — VANCOMYCIN HCL IN DEXTROSE 1-5 GM/200ML-% IV SOLN
1000.0000 mg | INTRAVENOUS | Status: DC
  Administered 2014-01-23 – 2014-01-26 (×5): 1000 mg via INTRAVENOUS
  Filled 2014-01-22 (×7): qty 200

## 2014-01-22 MED ORDER — DEXTROSE 5 % IV SOLN
1.0000 g | Freq: Once | INTRAVENOUS | Status: DC
  Filled 2014-01-22: qty 10

## 2014-01-22 MED ORDER — DILTIAZEM LOAD VIA INFUSION
10.0000 mg | Freq: Once | INTRAVENOUS | Status: AC
  Administered 2014-01-22: 10 mg via INTRAVENOUS
  Filled 2014-01-22: qty 10

## 2014-01-22 MED ORDER — LEVALBUTEROL HCL 1.25 MG/0.5ML IN NEBU
1.2500 mg | INHALATION_SOLUTION | Freq: Four times a day (QID) | RESPIRATORY_TRACT | Status: DC
  Administered 2014-01-23 (×2): 1.25 mg via RESPIRATORY_TRACT
  Filled 2014-01-22 (×6): qty 0.5

## 2014-01-22 NOTE — Telephone Encounter (Signed)
Left VM for pt's sister, Onalee Hua, to return nurse's call.  Sister returned call and I informed her that Dr. Alvy Bimler placed order for [facility] a cath removal and to expect call from Radiology Scheduler.  Also informed Dr. Alvy Bimler recommends to see pt back in 3 months and to expect call from [facility] scheduler.  Hazel verbalized understanding and will give message to pt and other sister.

## 2014-01-22 NOTE — ED Notes (Signed)
C/o BLE weakness worsening over past week. Pt with visible labored respirations, pt denies SOB. Audible wheezing

## 2014-01-22 NOTE — ED Notes (Signed)
RT called for ABG & neb

## 2014-01-22 NOTE — Progress Notes (Signed)
ANTIBIOTIC CONSULT NOTE - INITIAL  Pharmacy Consult for vancomycin and cefepime Indication: pneumonia  No Known Allergies  Patient Measurements:   Adjusted Body Weight: 72 kg  Vital Signs: BP: 103/57 mmHg (01/28 2045) Pulse Rate: 61 (01/28 2045) Intake/Output from previous day:   Intake/Output from this shift:    Labs:  Recent Labs  01/22/14 1816  WBC 2.4*  HGB 13.6  PLT 184  CREATININE 1.53*   The CrCl is unknown because both a height and weight (above a minimum accepted value) are required for this calculation. No results found for this basename: VANCOTROUGH, VANCOPEAK, VANCORANDOM, GENTTROUGH, GENTPEAK, GENTRANDOM, TOBRATROUGH, TOBRAPEAK, TOBRARND, AMIKACINPEAK, AMIKACINTROU, AMIKACIN,  in the last 72 hours   Microbiology: No results found for this or any previous visit (from the past 720 hour(s)).  Medical History: Past Medical History  Diagnosis Date  . Coronary artery disease     a. 02/2001 MI/Cath/CABG x 5: LIMA->LAD, VG->D1, VG->OM2, VG->PDA->LPL & MV Ring (#30 Seguin ring annuloplasty)  . PVD (peripheral vascular disease)   . Hyperlipidemia   . Mitral regurgitation     a. 02/2001 s/p #30 Seguin ring annuloplasty.  . Chronic systolic CHF (congestive heart failure)     a. 05/2013 Echo: appears to be signif overall LV dysfxn, cannot estimate EF.  Ao sclerosis;  b. 06/2013 Echo: Poor windows, ? septal and apical HK, mild LVH, EF likely low normal  . Hypertension   . CKD (chronic kidney disease), stage III   . Edentulous     all remaining teeth pulled-05-30-13  . A-fib     a. chronic-Dr. Hochrein,cardiology;  b. on coumadin.  [name] Lymphoma     Nasal Mass-left-Dr. Ha,oncology  . Bladder mass 07-03-13  . Carotid artery disease     a. 09/2012 U/S: 0-39% bilat ICA stenosis.  . Hemoptysis 12/25/2013    Medications:  See home med list Assessment: 80 year old man with a history of diffuse B cell lymphoma presents to the ED with shortness of breath.  Vancomycin and  cefepime to start for HCAP.  He has CKD - Scr is 1.5 and estimated creatinine clearance is 35-40 mL/min  Goal of Therapy:  Vancomycin trough level 15-20 mcg/ml  Plan:  Follow up culture results Vancomycin 1g IV q24 Cefepime 2g IV q24 Monitor renal function and adjust antibiotic doses if needed.  Candie Mile 01/22/2014,9:18 PM

## 2014-01-22 NOTE — H&P (Addendum)
[facility] History and Physical  Gabriel Estrada NWG:[id number] DOB: 03/12/1928 DOA: 01/22/2014  Referring physician: ED physician PCP: Walker Kehr, MD   Chief Complaint: shortness of breath   HPI:  Pt is 80 yo male with history of atrial fibrillation on coumadin, systolic CHF, diffuse B cell lymphoma under Dr. Alvy Bimler care and Dr. Lisbeth Renshaw (radiation oncologist), CKD stage III, who presents to [facility] ED with main concern of 2 weeks duration of progressively worsening shortness of breath, cough productive of yellow sputum, fevers, chills, malaise, poor oral intake. He was brought in by ambulance. Pt explains his dyspnea is worse with exertion and somewhat better with resting but no specific alleviating factors, no chest pian, no abdominal or urinary concerns. Pt denies sick contacts or exposures. In ED, pt found to be hypoxic with oxygen saturation in 80's, CXR findings worrisome for PNA. Pt also noted to be in atrial fibrillation and was started on cardizem drip. TRH asked to admit to SDU.   Assessment and Plan: Active Problems: Acute hypoxic respiratory failure - this is possibly multifactorial and secondary to HCAP and ? CHF, atrial fibrillation with RVR - will admit to SDU  - place on empiric ABX broad coverage for HCAP given immunocompromised immune system - place on BD's scheduled and as needed - obtain 2 D ECHO for assessment of LV function  - follow up on urine legionella and strep pnemuo, sputum culture  - monitor strict I's and O's, daily weights - currently on cardizem drip for atrial fibrillation - pt apparently on Coumadin but he denies taking it, INR is 3.43 Atrial fibrillation with RVR - continue cardizem - will confirm if pt on Coumadin  - consider cardiology consultation in AM  SIRS secondary to HCAP - with hypotension likely secondary to likely HCAP - continue broad spectrum ABX, follow up on blood culture, sputum culture, urine culture - procalcitonin pending -  check lactic acid Acute on chronic renal failure  - likely pre renal in etiology  - very gentle hydration as worrisome for CHF - SBP in 80's so will hydrate for 8 hours and reassess, 50 cc per hour  CHF, systolic  - order 2 D ECHO  - weight in ED 158 lbs, strict I's and O's, daily weights - close monitoring of renal function  Diffuse B cell lymphoma - will place consult in EPIC  Code Status: Full Family Communication: Pt at bedside Disposition Plan: Admit to SDU   Review of Systems:  Constitutional: Negative for diaphoresis.  HENT: Negative for hearing loss, ear pain, nosebleeds, congestion, sore throat, neck pain, tinnitus and ear discharge.   Eyes: Negative for blurred vision, double vision, photophobia, pain, discharge and redness.  Respiratory: Negative for wheezing and stridor.   Cardiovascular: Negative for chest pain, orthopnea, claudication and leg swelling.  Gastrointestinal: Negative for heartburn, constipation, blood in stool and melena.  Genitourinary: Negative for dysuria, urgency, frequency, hematuria and flank pain.  Musculoskeletal: Negative for myalgias, back pain, joint pain and falls.  Skin: Negative for itching and rash.  Neurological: Negative for tingling, tremors, sensory change, speech change, focal weakness, loss of consciousness and headaches.  Endo/Heme/Allergies: Negative for environmental allergies and polydipsia. Does not bruise/bleed easily.  Psychiatric/Behavioral: Negative for suicidal ideas. The patient is not nervous/anxious.      Past Medical History  Diagnosis Date  . Coronary artery disease     a. 02/2001 MI/Cath/CABG x 5: LIMA->LAD, VG->D1, VG->OM2, VG->PDA->LPL & MV Ring (#30 Seguin ring annuloplasty)  . PVD (peripheral  vascular disease)   . Hyperlipidemia   . Mitral regurgitation     a. 02/2001 s/p #30 Seguin ring annuloplasty.  . Chronic systolic CHF (congestive heart failure)     a. 05/2013 Echo: appears to be signif overall LV dysfxn,  cannot estimate EF.  Ao sclerosis;  b. 06/2013 Echo: Poor windows, ? septal and apical HK, mild LVH, EF likely low normal  . Hypertension   . CKD (chronic kidney disease), stage III   . Edentulous     all remaining teeth pulled-05-30-13  . A-fib     a. chronic-Dr. Hochrein,cardiology;  b. on coumadin.  [name] Lymphoma     Nasal Mass-left-Dr. Ha,oncology  . Bladder mass 07-03-13  . Carotid artery disease     a. 09/2012 U/S: 0-39% bilat ICA stenosis.  . Hemoptysis 12/25/2013    Past Surgical History  Procedure Laterality Date  . Coronary artery bypass graft  03/15/01    x 5  . Multiple extractions with alveoloplasty N/A 05/30/2013    Procedure: Extraction of tooth #'s 2,6,7,8,10,11,12,18,20,21,22,23,24,25,26,27,28,29 with alveoloplasty and mandibular tori reductions, and lateral exostoses reductions.;  Surgeon: Lenn Cal, DDS;  Location: [place];  Service: Oral Surgery;  Laterality: N/A;  . Soft tissue biopsy       Diffuse B-Cell Lynphoma/ Nasal mucosa, biopsy, left,left nasal mass  . Portacath placement Right     right chest  . Cystoscopy w/ retrogrades Bilateral 07/08/2013    Procedure: CYSTOSCOPY WITH RETROGRADE PYELOGRAM;  Surgeon: Dutch Gray, MD;  Location: [facility];  Service: Urology;  Laterality: Bilateral;  GENERAL ANESTHESIA WITH PARALYSIS, EXAM UNDER ANESTHESIA        Social History:  reports that he quit smoking about 3 years ago. He has never used smokeless tobacco. He reports that he does not drink alcohol or use illicit drugs.  No Known Allergies  Family History  Problem Relation Age of Onset  . Heart attack Mother   . Heart disease Sister   . Heart attack Brother   . Heart attack Brother   . Heart disease Sister   . Heart disease Sister   . Cancer Father     lung?  . Cancer Maternal Aunt     kidney cancer    Prior to Admission medications   Medication Sig Start Date End Date Taking? Authorizing Provider  carvedilol (COREG) 6.25 MG tablet Take 6.25 mg by mouth 2  (two) times daily with a meal.   Yes Historical Provider, MD  cholecalciferol (VITAMIN D) 1000 UNITS tablet Take 1 tablet (1,000 Units total) by mouth daily. 01/06/14 01/06/15 Yes Cassandria Anger, MD    Physical Exam: Filed Vitals:   01/22/14 1830 01/22/14 1900 01/22/14 1915 01/22/14 2009  BP: 152/94 133/73 129/74 91/52  Pulse: 109 82 109 54  Resp: 39 25 34 27  SpO2: 84% 95% 98% 97%    Physical Exam  Constitutional: Appears stable and in mild distress due to dyspnea  HENT: Normocephalic. External right and left ear normal. Dry MM Eyes: Conjunctivae and EOM are normal. PERRLA, no scleral icterus.  Neck: Normal ROM. Neck supple. No JVD. No tracheal deviation. No thyromegaly.  CVS: IRRR, no gallops, no carotid bruit.  Pulmonary: Course breath sounds bilaterally with slight tachypnea, no accessory muscles use for breathing   Abdominal: Soft. BS +,  no distension, tenderness, rebound or guarding.  Musculoskeletal: Normal range of motion. No edema and no tenderness.  Lymphadenopathy: No lymphadenopathy noted, cervical, inguinal. Neuro: Alert. Normal reflexes, muscle tone  coordination. No cranial nerve deficit. Skin: Skin is warm and dry. No rash noted. Not diaphoretic. No erythema. No pallor.  Psychiatric: Normal mood and affect. Behavior, judgment, thought content normal.   Labs on Admission:  Basic Metabolic Panel:  Recent Labs Lab 01/22/14 1816  NA 142  K 4.9  CL 101  CO2 25  GLUCOSE 154*  BUN 35*  CREATININE 1.53*  CALCIUM 8.9   CBC:  Recent Labs Lab 01/22/14 1816  WBC 2.4*  HGB 13.6  HCT 41.8  MCV 92.1  PLT 184   Radiological Exams on Admission: Dg Chest Port 1 View   01/22/2014   1. Confluent parenchymal density in the mid and lower right lung is consistent with the clinically suspected pneumonia.  2. Increased interstitial markings diffusely are consistent with CHF.    EKG: Normal sinus rhythm, no ST/T wave changes  Faye Ramsay, MD  [facility] Pager [phone]  If 7PM-7AM, please contact night-coverage [email or web address] Password [email or web address] 01/22/2014, 8:43 PM

## 2014-01-22 NOTE — ED Notes (Signed)
ED MD aware of ABG results. RT at bedside placing pt on Bipap

## 2014-01-22 NOTE — ED Provider Notes (Signed)
CSN: [id number]     Arrival date & time 01/22/14  1745 History   First MD Initiated Contact with Patient 01/22/14 1801     Chief Complaint  Patient presents with  . Weakness  . Shortness of Breath   (Consider location/radiation/quality/duration/timing/severity/associated sxs/prior Treatment) HPI Comments: Patient presents to the ER by ambulance. Patient called EMS because of progressively worsening generalized weakness. He reported that his legs which we told him. Upon arrival, however, EMS identified respiratory distress. Patient was also found to be tachycardic.  Patient reports that he has had a productive cough for approximately 2 or 3 weeks. He has not been running a fever. He has noticed increasing shortness of breath. Patient states that occasionally the cough is productive of sputum and sometimes he coughs up a small amount of blood. He is not experiencing any chest pain. Patient has noticed intermittent palpitations in his chest, however.  Patient is a 80 y.o. male presenting with weakness and shortness of breath.  Weakness Associated symptoms include shortness of breath. Pertinent negatives include no chest pain.  Shortness of Breath Associated symptoms: cough   Associated symptoms: no chest pain     Past Medical History  Diagnosis Date  . Coronary artery disease     a. 02/2001 MI/Cath/CABG x 5: LIMA->LAD, VG->D1, VG->OM2, VG->PDA->LPL & MV Ring (#30 Seguin ring annuloplasty)  . PVD (peripheral vascular disease)   . Hyperlipidemia   . Mitral regurgitation     a. 02/2001 s/p #30 Seguin ring annuloplasty.  . Chronic systolic CHF (congestive heart failure)     a. 05/2013 Echo: appears to be signif overall LV dysfxn, cannot estimate EF.  Ao sclerosis;  b. 06/2013 Echo: Poor windows, ? septal and apical HK, mild LVH, EF likely low normal  . Hypertension   . CKD (chronic kidney disease), stage III   . Edentulous     all remaining teeth pulled-05-30-13  . A-fib     a. chronic-Dr.  Hochrein,cardiology;  b. on coumadin.  [name] Lymphoma     Nasal Mass-left-Dr. Ha,oncology  . Bladder mass 07-03-13  . Carotid artery disease     a. 09/2012 U/S: 0-39% bilat ICA stenosis.  . Hemoptysis 12/25/2013   Past Surgical History  Procedure Laterality Date  . Coronary artery bypass graft  03/15/01    x 5  . Multiple extractions with alveoloplasty N/A 05/30/2013    Procedure: Extraction of tooth #'s 2,6,7,8,10,11,12,18,20,21,22,23,24,25,26,27,28,29 with alveoloplasty and mandibular tori reductions, and lateral exostoses reductions.;  Surgeon: Lenn Cal, DDS;  Location: [place];  Service: Oral Surgery;  Laterality: N/A;  . Soft tissue biopsy       Diffuse B-Cell Lynphoma/ Nasal mucosa, biopsy, left,left nasal mass  . Portacath placement Right     right chest  . Cystoscopy w/ retrogrades Bilateral 07/08/2013    Procedure: CYSTOSCOPY WITH RETROGRADE PYELOGRAM;  Surgeon: Dutch Gray, MD;  Location: [facility];  Service: Urology;  Laterality: Bilateral;  GENERAL ANESTHESIA WITH PARALYSIS, EXAM UNDER ANESTHESIA       Family History  Problem Relation Age of Onset  . Heart attack Mother   . Heart disease Sister   . Heart attack Brother   . Heart attack Brother   . Heart disease Sister   . Heart disease Sister   . Cancer Father     lung?  . Cancer Maternal Aunt     kidney cancer   History  Substance Use Topics  . Smoking status: Former Smoker -- 1.00 packs/day for 20  years    Quit date: 12/26/2010  . Smokeless tobacco: Never Used  . Alcohol Use: No     Comment: Used to drink beer, but not anymore.    Review of Systems  Constitutional: Positive for fatigue.  Respiratory: Positive for cough and shortness of breath.   Cardiovascular: Positive for palpitations. Negative for chest pain.  Neurological: Positive for weakness (generalized).  All other systems reviewed and are negative.    Allergies  Review of patient's allergies indicates no known allergies.  Home Medications    Current Outpatient Rx  Name  Route  Sig  Dispense  Refill  . carvedilol (COREG) 6.25 MG tablet   Oral   Take 6.25 mg by mouth 2 (two) times daily with a meal.         . cholecalciferol (VITAMIN D) 1000 UNITS tablet   Oral   Take 1 tablet (1,000 Units total) by mouth daily.   100 tablet   3    BP 91/52  Pulse 54  Resp 27  SpO2 97% Physical Exam  Constitutional: He is oriented to person, place, and time. He appears well-developed and well-nourished. He appears distressed.  HENT:  Head: Normocephalic and atraumatic.  Right Ear: Hearing normal.  Left Ear: Hearing normal.  Nose: Nose normal.  Mouth/Throat: Oropharynx is clear and moist and mucous membranes are normal.  Eyes: Conjunctivae and EOM are normal. Pupils are equal, round, and reactive to light.  Neck: Normal range of motion. Neck supple.  Cardiovascular: S1 normal and S2 normal.  An irregularly irregular rhythm present. Tachycardia present.  Exam reveals no gallop and no friction rub.   No murmur heard. Pulmonary/Chest: Accessory muscle usage present. Tachypnea noted. He is in respiratory distress. He has rales (diffuse). He exhibits no tenderness.  Abdominal: Soft. Normal appearance and bowel sounds are normal. There is no hepatosplenomegaly. There is no tenderness. There is no rebound, no guarding, no tenderness at McBurney's point and negative Murphy's sign. No hernia.  Musculoskeletal: Normal range of motion.  Neurological: He is alert and oriented to person, place, and time. He has normal strength. No cranial nerve deficit or sensory deficit. Coordination normal. GCS eye subscore is 4. GCS verbal subscore is 5. GCS motor subscore is 6.  Skin: Skin is warm, dry and intact. No rash noted. No cyanosis.  Psychiatric: He has a normal mood and affect. His speech is normal and behavior is normal. Thought content normal.    ED Course  Procedures (including critical care time) Labs Review Labs Reviewed  CBC - Abnormal;  Notable for the following:    WBC 2.4 (*)    RDW 15.7 (*)    All other components within normal limits  BASIC METABOLIC PANEL - Abnormal; Notable for the following:    Glucose, Bld 154 (*)    BUN 35 (*)    Creatinine, Ser 1.53 (*)    GFR calc non Af Amer 40 (*)    GFR calc Af Amer 47 (*)    All other components within normal limits  PRO B NATRIURETIC PEPTIDE - Abnormal; Notable for the following:    Pro B Natriuretic peptide (BNP) 4885.0 (*)    All other components within normal limits  PROTIME-INR - Abnormal; Notable for the following:    Prothrombin Time 33.3 (*)    INR 3.43 (*)    All other components within normal limits  DIGOXIN LEVEL - Abnormal; Notable for the following:    Digoxin Level 0.5 (*)  All other components within normal limits  POCT I-STAT 3, BLOOD GAS (G3+) - Abnormal; Notable for the following:    pH, Arterial 7.332 (*)    pCO2 arterial 50.1 (*)    pO2, Arterial 45.0 (*)    Bicarbonate 26.5 (*)    All other components within normal limits  CULTURE, BLOOD (ROUTINE X 2)  CULTURE, BLOOD (ROUTINE X 2)  POCT I-STAT TROPONIN I   Imaging Review Dg Chest Port 1 View  01/22/2014   CLINICAL DATA:  History of atrial fibrillation and bladder malignancy now with dyspnea, possible pneumonia.  EXAM: PORTABLE CHEST - 1 VIEW  COMPARISON:  PA and lateral chest x-ray of July 25, 2013 and PET-CT study of November 11, 2013.  FINDINGS: The lungs are adequately inflated. The interstitial markings are increased. There are confluent alveolar densities confluent in the right mid and lower lung consistent with pneumonia. The cardiac silhouette is top-normal in size. The patient has undergone previous CABG.The pulmonary vascularity is engorged. A Port-A-Cath appliance is in place and stable.  IMPRESSION: 1. Confluent parenchymal density in the mid and lower right lung is consistent with the clinically suspected pneumonia. 2. Increased interstitial markings diffusely are consistent with  CHF.   Electronically Signed   By: David  Martinique   On: 01/22/2014 18:18    EKG Interpretation    Date/Time:  Wednesday January 22 2014 17:50:07 EST Ventricular Rate:  130 PR Interval:  166 QRS Duration: 164 QT Interval:  363 QTC Calculation: 534 R Axis:   110 Text Interpretation:  Age not entered, assumed to be  80 years old for purpose of ECG interpretation Sinus tachycardia with irregular rate vs, AFib Probable left atrial enlargement RBBB and LPFB ST depr, consider ischemia, inferior leads Confirmed by Sheriff Rodenberg  MD, Wirt Hemmerich ([id number]) on 01/22/2014 6:04:04 PM            MDM   1. CAP (community acquired pneumonia)   2. CHF (congestive heart failure)   3. Atrial fibrillation with RVR   4. Acute respiratory failure    Initially called EMS because of progressively worsening weakness, and was found to be in significant respiratory distress as well. Patient admits to cough has been going on for at least 2 weeks. He has occasionally had hemoptysis. Reviewing his records reveals that he recently saw his doctor for this and it was felt that the bleeding was secondary to forceful coughing and anticoagulation. He does have some pleuritic pain today. PE considered, but D-dimer is negative.  Chest Xray consistent with Right side Pneumonia and CHF.  Patient is in atrial fibrillation with a ventricular response. This is probably partially secondary to his respiratory distress. He was taken to the Cardizem with some improvement. He is not hypotensive at all. Patient does not appear to be septic. Patient has not had any recent hospitalization, because community-acquired pneumonia with Rocephin and Zithromax. Patient is severely hypoxic on arrival, but despite the gas, and was therefore placed on BiPAP with improvement. Patient was admitted to the step down unit.  CRITICAL CARE Performed by: Orpah Greek   Total critical care time: 29min  Critical care time was exclusive of  separately billable procedures and treating other patients.  Critical care was necessary to treat or prevent imminent or life-threatening deterioration.  Critical care was time spent personally by me on the following activities: development of treatment plan with patient and/or surrogate as well as nursing, discussions with consultants, evaluation of patient's response to treatment, examination of  patient, obtaining history from patient or surrogate, ordering and performing treatments and interventions, ordering and review of laboratory studies, ordering and review of radiographic studies, pulse oximetry and re-evaluation of patient's condition.   Orpah Greek, MD 01/22/14 2030

## 2014-01-22 NOTE — ED Notes (Signed)
EDMD at bedside

## 2014-01-23 ENCOUNTER — Inpatient Hospital Stay (HOSPITAL_COMMUNITY): Payer: Medicare Other

## 2014-01-23 DIAGNOSIS — I5022 Chronic systolic (congestive) heart failure: Secondary | ICD-10-CM | POA: Insufficient documentation

## 2014-01-23 DIAGNOSIS — I059 Rheumatic mitral valve disease, unspecified: Secondary | ICD-10-CM

## 2014-01-23 DIAGNOSIS — J9600 Acute respiratory failure, unspecified whether with hypoxia or hypercapnia: Secondary | ICD-10-CM

## 2014-01-23 DIAGNOSIS — J9601 Acute respiratory failure with hypoxia: Secondary | ICD-10-CM | POA: Diagnosis present

## 2014-01-23 DIAGNOSIS — I4891 Unspecified atrial fibrillation: Secondary | ICD-10-CM

## 2014-01-23 LAB — GLUCOSE, CAPILLARY: GLUCOSE-CAPILLARY: 98 mg/dL (ref 70–99)

## 2014-01-23 LAB — CBC
HCT: 30.6 % — ABNORMAL LOW (ref 39.0–52.0)
Hemoglobin: 9.8 g/dL — ABNORMAL LOW (ref 13.0–17.0)
MCH: 29.3 pg (ref 26.0–34.0)
MCHC: 32 g/dL (ref 30.0–36.0)
MCV: 91.3 fL (ref 78.0–100.0)
PLATELETS: 145 10*3/uL — AB (ref 150–400)
RBC: 3.35 MIL/uL — ABNORMAL LOW (ref 4.22–5.81)
RDW: 15.7 % — AB (ref 11.5–15.5)
WBC: 2.3 10*3/uL — AB (ref 4.0–10.5)

## 2014-01-23 LAB — BASIC METABOLIC PANEL
BUN: 36 mg/dL — AB (ref 6–23)
CALCIUM: 8.2 mg/dL — AB (ref 8.4–10.5)
CHLORIDE: 104 meq/L (ref 96–112)
CO2: 26 mEq/L (ref 19–32)
CREATININE: 1.61 mg/dL — AB (ref 0.50–1.35)
GFR calc non Af Amer: 38 mL/min — ABNORMAL LOW (ref >90)
GFR, EST AFRICAN AMERICAN: 44 mL/min — AB (ref >90)
Glucose, Bld: 120 mg/dL — ABNORMAL HIGH (ref 70–99)
Potassium: 4.9 mEq/L (ref 3.7–5.3)
Sodium: 140 mEq/L (ref 137–147)

## 2014-01-23 LAB — LEGIONELLA ANTIGEN, URINE: Legionella Antigen, Urine: NEGATIVE

## 2014-01-23 LAB — HIV ANTIBODY (ROUTINE TESTING W REFLEX): HIV: NONREACTIVE

## 2014-01-23 LAB — URINE CULTURE: Colony Count: 30000

## 2014-01-23 LAB — INFLUENZA PANEL BY PCR (TYPE A & B)
H1N1 flu by pcr: NOT DETECTED
INFLBPCR: NEGATIVE
Influenza A By PCR: NEGATIVE

## 2014-01-23 LAB — TSH: TSH: 2.32 u[IU]/mL (ref 0.350–4.500)

## 2014-01-23 LAB — TROPONIN I

## 2014-01-23 LAB — STREP PNEUMONIAE URINARY ANTIGEN: STREP PNEUMO URINARY ANTIGEN: NEGATIVE

## 2014-01-23 LAB — PROTIME-INR
INR: 3.65 — ABNORMAL HIGH (ref 0.00–1.49)
Prothrombin Time: 34.9 seconds — ABNORMAL HIGH (ref 11.6–15.2)

## 2014-01-23 MED ORDER — CARVEDILOL 6.25 MG PO TABS
6.2500 mg | ORAL_TABLET | Freq: Two times a day (BID) | ORAL | Status: DC
  Administered 2014-01-23 – 2014-01-27 (×7): 6.25 mg via ORAL
  Filled 2014-01-23 (×10): qty 1

## 2014-01-23 MED ORDER — DIGOXIN 125 MCG PO TABS
0.1250 mg | ORAL_TABLET | Freq: Every day | ORAL | Status: DC
  Administered 2014-01-23 – 2014-01-27 (×5): 0.125 mg via ORAL
  Filled 2014-01-23 (×5): qty 1

## 2014-01-23 MED ORDER — DOCUSATE SODIUM 100 MG PO CAPS
100.0000 mg | ORAL_CAPSULE | Freq: Two times a day (BID) | ORAL | Status: DC
  Administered 2014-01-23 – 2014-01-26 (×7): 100 mg via ORAL
  Filled 2014-01-23 (×10): qty 1

## 2014-01-23 MED ORDER — SODIUM CHLORIDE 0.9 % IV BOLUS (SEPSIS): 500.0000 mL | Freq: Once | INTRAVENOUS | Status: DC

## 2014-01-23 MED ORDER — LEVALBUTEROL HCL 1.25 MG/0.5ML IN NEBU
1.2500 mg | INHALATION_SOLUTION | Freq: Three times a day (TID) | RESPIRATORY_TRACT | Status: DC
  Administered 2014-01-23 – 2014-01-27 (×13): 1.25 mg via RESPIRATORY_TRACT
  Filled 2014-01-23 (×15): qty 0.5

## 2014-01-23 MED ORDER — DEXTROSE 5 % IV SOLN
500.0000 mg | INTRAVENOUS | Status: DC
  Administered 2014-01-23 – 2014-01-24 (×2): 500 mg via INTRAVENOUS
  Filled 2014-01-23 (×4): qty 500

## 2014-01-23 MED ORDER — EZETIMIBE-SIMVASTATIN 10-40 MG PO TABS
1.0000 | ORAL_TABLET | Freq: Every day | ORAL | Status: DC
  Administered 2014-01-23 – 2014-01-26 (×4): 1 via ORAL
  Filled 2014-01-23 (×5): qty 1

## 2014-01-23 NOTE — Progress Notes (Signed)
Gabriel Estrada - Stepdown / ICU Progress Note  Gabriel Estrada EPP:[id number] DOB: 1928-11-16 DOA: Estrada/28/2015 PCP: Walker Kehr, MD  Brief narrative: 80 yo male patient with chronic atrial fibrillation on Coumadin, systolic heart failure, diffuse B-cell lymphoma under care of oncology as well as chronic kidney disease stage III. Patient presented to the emergency department with complaints of shortness of breath over 2 weeks that has been progressive in nature. This was associated with productive cough of yellow sputum, fevers she also malaise and poor oral intake. He was brought in by EMS. He does endorse dyspnea on exertion that seems to improve somewhat with resting. No chest pain. In the emergency department patient was found to be hypoxic with O2 saturations 80%. Chest x-ray was concerning for pneumonia. He was noted to be in atrial fibrillation with RVR and was started on a Cardizem drip by the emergency room physician.  Assessment/Plan: Active Problems: Acute respiratory failure with hypoxia:   A) HCAP (healthcare-associated pneumonia)   B) ? SHF exacerbation -predominant right side so need to exclude aspiration- SLP eval: no dysphagia and rec regular diet -cont broad spectrum anbx's-add Zithromax-completed chemotherapy at the end of October and is apparently on radiation therapy with noted white cell count 2300 -cont supportive care -Flu PCR neg- resp viral panel pending -urinary strep and legionella negative -Given the progression of systolic heart failure and question of CHF on x-ray may have a degree of decompensated systolic heart failure as well    Atrial fibrillation with RVR -rate rapidly controlled so initial Cardizem gtt dc'd (also became hypotensive) so was transitioned to home digoxin and Coreg -possibly volume depleted/water deficit at admit calculated at 0.61 L -TSH normal    CKD (chronic kidney disease), stage III -mild renal insufficiency -received IVFs for 8  hours    Chronic systolic congestive heart failure, NYHA class 3 -FU echo this admit shows progression 35-40% with severe hypokinesis of entire inferolateral and inferior myocardium-this has progressed since previous echo: last echo with EF in 2013 was 45-50% and NO RWMA-echo July 2014 ? Septal and apical hypokinesis -unclear if lower EF from asymptomatic RVR or due to ischemia esp in setting of RWMA and known CAD -? Chemotherapy induce CM?- received Rituxan/Cytoxan/Etoposide/Vincristine from 08/30/13 to 10/16/13 -also found with pulmonary HTN 65 mmHg -consult Cardiology    Mixed hyperlipidemia    Long term current use of anticoagulant -INR >3.0 at admit do repeat today -Pharmacy to dose/manage    DLBCL (diffuse large B cell lymphoma) -last chemo as above and currently on rad tx   DVT prophylaxis: Warfarin Code Status: Full Family Communication: No family at bedside Disposition Plan/Expected LOS: Step down   Consultants: Cardiology  Procedures: 2-D echocardiogram - Left ventricle: The cavity size was mildly dilated. Wall thickness was normal. Systolic function was moderately reduced. The estimated ejection fraction was in the range of 35% to 40%. Severe hypokinesis of the entireinferolateral and inferior myocardium. - Mitral valve: Mild regurgitation. - Right ventricle: Systolic function was mildly reduced. - Right atrium: The atrium was mildly dilated. - Pulmonary arteries: Systolic pressure was moderately increased. PA peak pressure: 42mm Hg (S).   Antibiotics: Zithromax Estrada/29 >>> Cefepime Estrada/28 >>> Vancomycin Estrada/28 >>>  HPI/Subjective:   Objective: Blood pressure 100/61, pulse 83, temperature 98 F (36.7 C), temperature source Oral, resp. rate 22, height 6' (Estrada.829 m), weight 158 lb 8.2 oz (71.9 kg), SpO2 95.00%.  Intake/Output Summary (Last 24 hours) at 01/23/14 1258 Last data filed at  01/23/14 1100  Gross per 24 hour  Intake 788.33 ml  Output    200 ml  Net  588.33 ml     Exam: General: No acute respiratory distress Lungs: Bilateral crackles, has weaned from Ventimask to 4 L nasal cannula Cardiovascular: Irregular rate and rhythm without murmur gallop or rub normal S1 and S2, no peripheral edema or JVD Abdomen: Nontender, nondistended, soft, bowel sounds positive, no rebound, no ascites, no appreciable mass Musculoskeletal: No significant cyanosis, clubbing of bilateral lower extremities Neurological: Alert and oriented x 3, moves all extremities x 4 without focal neurological deficits, CN 2-12 intact  Scheduled Meds:  Scheduled Meds: . azithromycin  500 mg Intravenous Q24H  . carvedilol  6.25 mg Oral BID WC  . ceFEPime (MAXIPIME) IV  2 g Intravenous Q24H  . digoxin  0.125 mg Oral Daily  . docusate sodium  100 mg Oral BID  . ezetimibe-simvastatin  Estrada tablet Oral QHS  . levalbuterol  Estrada.25 mg Nebulization TID  . vancomycin  Estrada,000 mg Intravenous Q24H   Continuous Infusions:   Data Reviewed: Basic Metabolic Panel:  Recent Labs Lab 01/22/14 1816 01/23/14 0440  NA 142 140  K 4.9 4.9  CL 101 104  CO2 25 26  GLUCOSE 154* 120*  BUN 35* 36*  CREATININE Estrada.53* Estrada.61*  CALCIUM 8.9 8.2*   Liver Function Tests: No results found for this basename: AST, ALT, ALKPHOS, BILITOT, PROT, ALBUMIN,  in the last 168 hours No results found for this basename: LIPASE, AMYLASE,  in the last 168 hours No results found for this basename: AMMONIA,  in the last 168 hours CBC:  Recent Labs Lab 01/22/14 1816 01/23/14 0440  WBC 2.4* 2.3*  HGB 13.6 9.8*  HCT 41.8 30.6*  MCV 92.Estrada 91.3  PLT 184 145*   Cardiac Enzymes:  Recent Labs Lab 01/22/14 2100 01/23/14 0430 01/23/14 1021  TROPONINI <0.30 <0.30 <0.30   BNP (last 3 results)  Recent Labs  07/22/13 1250 01/22/14 1816  PROBNP 11199.0* 4885.0*   CBG:  Recent Labs Lab 01/23/14 0031  GLUCAP 98    Recent Results (from the past 240 hour(s))  MRSA PCR SCREENING     Status: None    Collection Time    01/22/14  9:53 PM      Result Value Range Status   MRSA by PCR NEGATIVE  NEGATIVE Final   Comment:            The GeneXpert MRSA Assay (FDA     approved for NASAL specimens     only), is one component of a     comprehensive MRSA colonization     surveillance program. It is not     intended to diagnose MRSA     infection nor to guide or     monitor treatment for     MRSA infections.     Studies:  Recent x-ray studies have been reviewed in detail by the Attending Physician  Time spent :     Erin Hearing, [facility] Office  [phone] Pager [phone]  **If unable to reach the above provider after paging please contact the [facility] @ [phone]  On-Call/Text Page:      [email or web address]      password [email or web address]  If 7PM-7AM, please contact night-coverage [email or web address] Password [email or web address] Estrada/29/2015, 12:58 PM   LOS: Estrada day

## 2014-01-23 NOTE — Plan of Care (Cosign Needed)
HR controlled. BP a little soft. DC Cardizem gtt and resume home Coreg and Digoxin.  Erin Hearing, ANP

## 2014-01-23 NOTE — Progress Notes (Signed)
Utilization review completed. Johntay Doolen, RN, BSN. 

## 2014-01-23 NOTE — Progress Notes (Signed)
Pt BP 83/49, 82/48 upon arrival to the unit. Cardizem immediately stopped. F/u BP 93/52. MD notified and ok to stop Cardizem at this time. Pt tolerating NRB mask and O2 high 90's. Will continue to monitor.    M.Forest Gleason, RN

## 2014-01-23 NOTE — Consult Note (Signed)
Reason for Consult: Afib RVR Referring Physician: Rizwan Cardiologist: Hochrein  Gabriel Estrada is an 80 y.o. male.  HPI:   The patient is an 80 yo male with a history of Afib, CAD, CABG x5-02/2001(LIMA->LAD, VG->D1, VG->OM2, VG->PDA->LPL),  MV Ring-02/2001 (#30 Seguin ring annuloplasty), PVD-carotid disease, HTN, CKD III, lymphoma.  His last Echo was today, 01/23/14 and EF is 35-40%, severe hypokinesis of the entireinferolateral and inferior myocardium.  Prior to this study, the last reading echo was 03/27/12 and the EF was 45-50% with normal wall motion.  He had MUGA scan June 19, 2013 and his EF was calculated at 27%, with global hypokinesia of the left ventricle greatest at apex.  He is on coumadin.  No ischemia noted on lexiscan from  He underwent 3 cycles of chemotherapy for B-cell lymphoma-non-Hodgkin's lymphoma consisting of RCEOP(Rituxan/Cytoxan/Etoposide/Vincristine/ Prednisone)08/30/2013 to 10/16/2013.    He was admitted yesterday with SOB, acute respiratory failure, and diagnosed with HCAP, afib RVR.  The patient reports being congested and progressively getting mor SOB for the last couple of weeks.  No dizziness, CP, orthopnea or LEE.  The patient currently denies nausea, vomiting, fever, abdominal pain, hematochezia, melena, claudication.   Past Medical History  Diagnosis Date  . Coronary artery disease     a. 02/2001 MI/Cath/CABG x 5: LIMA->LAD, VG->D1, VG->OM2, VG->PDA->LPL & MV Ring (#30 Seguin ring annuloplasty)  . PVD (peripheral vascular disease)   . Hyperlipidemia   . Mitral regurgitation     a. 02/2001 s/p #30 Seguin ring annuloplasty.  . Chronic systolic CHF (congestive heart failure)     a. 05/2013 Echo: appears to be signif overall LV dysfxn, cannot estimate EF.  Ao sclerosis;  b. 06/2013 Echo: Poor windows, ? septal and apical HK, mild LVH, EF likely low normal  . Hypertension   . CKD (chronic kidney disease), stage III   . Edentulous     all remaining teeth  pulled-05-30-13  . A-fib     a. chronic-Dr. Hochrein,cardiology;  b. on coumadin.  [name] Lymphoma     Nasal Mass-left-Dr. Ha,oncology  . Bladder mass 07-03-13  . Carotid artery disease     a. 09/2012 U/S: 0-39% bilat ICA stenosis.  . Hemoptysis 12/25/2013    Past Surgical History  Procedure Laterality Date  . Coronary artery bypass graft  03/15/01    x 5  . Multiple extractions with alveoloplasty N/A 05/30/2013    Procedure: Extraction of tooth #'s 2,6,7,8,10,11,12,18,20,21,22,23,24,25,26,27,28,29 with alveoloplasty and mandibular tori reductions, and lateral exostoses reductions.;  Surgeon: Lenn Cal, DDS;  Location: [facility];  Service: Oral Surgery;  Laterality: N/A;  . Soft tissue biopsy       Diffuse B-Cell Lynphoma/ Nasal mucosa, biopsy, left,left nasal mass  . Portacath placement Right     right chest  . Cystoscopy w/ retrogrades Bilateral 07/08/2013    Procedure: CYSTOSCOPY WITH RETROGRADE PYELOGRAM;  Surgeon: Dutch Gray, MD;  Location: [facility];  Service: Urology;  Laterality: Bilateral;  GENERAL ANESTHESIA WITH PARALYSIS, EXAM UNDER ANESTHESIA        Family History  Problem Relation Age of Onset  . Heart attack Mother   . Heart disease Sister   . Heart attack Brother   . Heart attack Brother   . Heart disease Sister   . Heart disease Sister   . Cancer Father     lung?  . Cancer Maternal Aunt     kidney cancer    Social History:  reports that he quit smoking about  3 years ago. He has never used smokeless tobacco. He reports that he does not drink alcohol or use illicit drugs.  Allergies: No Known Allergies  Medications:  Scheduled Meds: . azithromycin  500 mg Intravenous Q24H  . carvedilol  6.25 mg Oral BID WC  . ceFEPime (MAXIPIME) IV  2 g Intravenous Q24H  . digoxin  0.125 mg Oral Daily  . docusate sodium  100 mg Oral BID  . ezetimibe-simvastatin  1 tablet Oral QHS  . levalbuterol  1.25 mg Nebulization TID  . vancomycin  1,000 mg Intravenous Q24H    Continuous Infusions:  PRN Meds:.ipratropium, levalbuterol, ondansetron (ZOFRAN) IV, oxyCODONE-acetaminophen   Results for orders placed during the hospital encounter of 01/22/14 (from the past 48 hour(s))  CBC     Status: Abnormal   Collection Time    01/22/14  6:16 PM      Result Value Range   WBC 2.4 (*) 4.0 - 10.5 K/uL   RBC 4.54  4.22 - 5.81 MIL/uL   Hemoglobin 13.6  13.0 - 17.0 g/dL   HCT 41.8  39.0 - 52.0 %   MCV 92.1  78.0 - 100.0 fL   MCH 30.0  26.0 - 34.0 pg   MCHC 32.5  30.0 - 36.0 g/dL   RDW 15.7 (*) 11.5 - 15.5 %   Platelets 184  150 - 400 K/uL  BASIC METABOLIC PANEL     Status: Abnormal   Collection Time    01/22/14  6:16 PM      Result Value Range   Sodium 142  137 - 147 mEq/L   Potassium 4.9  3.7 - 5.3 mEq/L   Chloride 101  96 - 112 mEq/L   CO2 25  19 - 32 mEq/L   Glucose, Bld 154 (*) 70 - 99 mg/dL   BUN 35 (*) 6 - 23 mg/dL   Creatinine, Ser 1.53 (*) 0.50 - 1.35 mg/dL   Calcium 8.9  8.4 - 10.5 mg/dL   GFR calc non Af Amer 40 (*) >90 mL/min   GFR calc Af Amer 47 (*) >90 mL/min   Comment: (NOTE)     The eGFR has been calculated using the CKD EPI equation.     This calculation has not been validated in all clinical situations.     eGFR's persistently <90 mL/min signify possible Chronic Kidney     Disease.  PRO B NATRIURETIC PEPTIDE     Status: Abnormal   Collection Time    01/22/14  6:16 PM      Result Value Range   Pro B Natriuretic peptide (BNP) 4885.0 (*) 0 - 450 pg/mL  PROTIME-INR     Status: Abnormal   Collection Time    01/22/14  6:16 PM      Result Value Range   Prothrombin Time 33.3 (*) 11.6 - 15.2 seconds   INR 3.43 (*) 0.00 - 1.49  POCT I-STAT TROPONIN I     Status: None   Collection Time    01/22/14  6:27 PM      Result Value Range   Troponin i, poc 0.03  0.00 - 0.08 ng/mL   Comment 3            Comment: Due to the release kinetics of cTnI,     a negative result within the first hours     of the onset of symptoms does not rule out      myocardial infarction with certainty.     If myocardial  infarction is still suspected,     repeat the test at appropriate intervals.  POCT I-STAT 3, BLOOD GAS (G3+)     Status: Abnormal   Collection Time    01/22/14  6:33 PM      Result Value Range   pH, Arterial 7.332 (*) 7.350 - 7.450   pCO2 arterial 50.1 (*) 35.0 - 45.0 mmHg   pO2, Arterial 45.0 (*) 80.0 - 100.0 mmHg   Bicarbonate 26.5 (*) 20.0 - 24.0 mEq/L   TCO2 28  0 - 100 mmol/L   O2 Saturation 77.0     Patient temperature 98.6 F     Collection site BRACHIAL ARTERY     Drawn by Operator     Sample type ARTERIAL    DIGOXIN LEVEL     Status: Abnormal   Collection Time    01/22/14  7:30 PM      Result Value Range   Digoxin Level 0.5 (*) 0.8 - 2.0 ng/mL  LACTIC ACID, PLASMA     Status: None   Collection Time    01/22/14  8:50 PM      Result Value Range   Lactic Acid, Venous 1.4  0.5 - 2.2 mmol/L  TROPONIN I     Status: None   Collection Time    01/22/14  9:00 PM      Result Value Range   Troponin I <0.30  <0.30 ng/mL   Comment:            Due to the release kinetics of cTnI,     a negative result within the first hours     of the onset of symptoms does not rule out     myocardial infarction with certainty.     If myocardial infarction is still suspected,     repeat the test at appropriate intervals.  HIV ANTIBODY (ROUTINE TESTING)     Status: None   Collection Time    01/22/14  9:04 PM      Result Value Range   HIV NON REACTIVE  NON REACTIVE   Comment: Performed at [facility]  TSH     Status: None   Collection Time    01/22/14  9:04 PM      Result Value Range   TSH 2.320  0.350 - 4.500 uIU/mL   Comment: Performed at [place] PCR SCREENING     Status: None   Collection Time    01/22/14  9:53 PM      Result Value Range   MRSA by PCR NEGATIVE  NEGATIVE   Comment:            The GeneXpert MRSA Assay (FDA     approved for NASAL specimens     only), is one component of a      comprehensive MRSA colonization     surveillance program. It is not     intended to diagnose MRSA     infection nor to guide or     monitor treatment for     MRSA infections.  URINALYSIS, ROUTINE W REFLEX MICROSCOPIC     Status: Abnormal   Collection Time    01/22/14 10:15 PM      Result Value Range   Color, Urine AMBER (*) YELLOW   Comment: BIOCHEMICALS MAY BE AFFECTED BY COLOR   APPearance CLEAR  CLEAR   Specific Gravity, Urine 1.031 (*) 1.005 - 1.030   pH 5.0  5.0 - 8.0  Glucose, UA NEGATIVE  NEGATIVE mg/dL   Hgb urine dipstick NEGATIVE  NEGATIVE   Bilirubin Urine NEGATIVE  NEGATIVE   Ketones, ur 15 (*) NEGATIVE mg/dL   Protein, ur 30 (*) NEGATIVE mg/dL   Urobilinogen, UA 0.2  0.0 - 1.0 mg/dL   Nitrite NEGATIVE  NEGATIVE   Leukocytes, UA TRACE (*) NEGATIVE  LEGIONELLA ANTIGEN, URINE     Status: None   Collection Time    01/22/14 10:15 PM      Result Value Range   Specimen Description URINE, RANDOM     Special Requests NONE     Legionella Antigen, Urine       Value: Negative for Legionella pneumophilia serogroup 1     Performed at [facility]   Report Status 01/23/2014 FINAL    URINE MICROSCOPIC-ADD ON     Status: Abnormal   Collection Time    01/22/14 10:15 PM      Result Value Range   Squamous Epithelial / LPF RARE  RARE   WBC, UA 3-6  <3 WBC/hpf   RBC / HPF 0-2  <3 RBC/hpf   Bacteria, UA FEW (*) RARE   Urine-Other MUCOUS PRESENT    STREP PNEUMONIAE URINARY ANTIGEN     Status: None   Collection Time    01/22/14 10:16 PM      Result Value Range   Strep Pneumo Urinary Antigen NEGATIVE  NEGATIVE   Comment:            Infection due to S. pneumoniae     cannot be absolutely ruled out     since the antigen present     may be below the detection limit     of the test.  GLUCOSE, CAPILLARY     Status: None   Collection Time    01/23/14 12:31 AM      Result Value Range   Glucose-Capillary 98  70 - 99 mg/dL   Comment 1 Notify RN    INFLUENZA PANEL BY PCR  (TYPE A & B, H1N1)     Status: None   Collection Time    01/23/14 12:53 AM      Result Value Range   Influenza A By PCR NEGATIVE  NEGATIVE   Influenza B By PCR NEGATIVE  NEGATIVE   H1N1 flu by pcr NOT DETECTED  NOT DETECTED   Comment:            The Xpert Flu assay (FDA approved for     nasal aspirates or washes and     nasopharyngeal swab specimens), is     intended as an aid in the diagnosis of     influenza and should not be used as     a sole basis for treatment.  TROPONIN I     Status: None   Collection Time    01/23/14  4:30 AM      Result Value Range   Troponin I <0.30  <0.30 ng/mL   Comment:            Due to the release kinetics of cTnI,     a negative result within the first hours     of the onset of symptoms does not rule out     myocardial infarction with certainty.     If myocardial infarction is still suspected,     repeat the test at appropriate intervals.  BASIC METABOLIC PANEL     Status: Abnormal   Collection Time  01/23/14  4:40 AM      Result Value Range   Sodium 140  137 - 147 mEq/L   Potassium 4.9  3.7 - 5.3 mEq/L   Chloride 104  96 - 112 mEq/L   CO2 26  19 - 32 mEq/L   Glucose, Bld 120 (*) 70 - 99 mg/dL   BUN 36 (*) 6 - 23 mg/dL   Creatinine, Ser 1.61 (*) 0.50 - 1.35 mg/dL   Calcium 8.2 (*) 8.4 - 10.5 mg/dL   GFR calc non Af Amer 38 (*) >90 mL/min   GFR calc Af Amer 44 (*) >90 mL/min   Comment: (NOTE)     The eGFR has been calculated using the CKD EPI equation.     This calculation has not been validated in all clinical situations.     eGFR's persistently <90 mL/min signify possible Chronic Kidney     Disease.  CBC     Status: Abnormal   Collection Time    01/23/14  4:40 AM      Result Value Range   WBC 2.3 (*) 4.0 - 10.5 K/uL   RBC 3.35 (*) 4.22 - 5.81 MIL/uL   Hemoglobin 9.8 (*) 13.0 - 17.0 g/dL   Comment: DELTA CHECK NOTED     REPEATED TO VERIFY   HCT 30.6 (*) 39.0 - 52.0 %   MCV 91.3  78.0 - 100.0 fL   MCH 29.3  26.0 - 34.0 pg    MCHC 32.0  30.0 - 36.0 g/dL   RDW 15.7 (*) 11.5 - 15.5 %   Platelets 145 (*) 150 - 400 K/uL  TROPONIN I     Status: None   Collection Time    01/23/14 10:21 AM      Result Value Range   Troponin I <0.30  <0.30 ng/mL   Comment:            Due to the release kinetics of cTnI,     a negative result within the first hours     of the onset of symptoms does not rule out     myocardial infarction with certainty.     If myocardial infarction is still suspected,     repeat the test at appropriate intervals.    Dg Chest Port 1 View  01/22/2014   CLINICAL DATA:  History of atrial fibrillation and bladder malignancy now with dyspnea, possible pneumonia.  EXAM: PORTABLE CHEST - 1 VIEW  COMPARISON:  PA and lateral chest x-ray of July 25, 2013 and PET-CT study of November 11, 2013.  FINDINGS: The lungs are adequately inflated. The interstitial markings are increased. There are confluent alveolar densities confluent in the right mid and lower lung consistent with pneumonia. The cardiac silhouette is top-normal in size. The patient has undergone previous CABG.The pulmonary vascularity is engorged. A Port-A-Cath appliance is in place and stable.  IMPRESSION: 1. Confluent parenchymal density in the mid and lower right lung is consistent with the clinically suspected pneumonia. 2. Increased interstitial markings diffusely are consistent with CHF.   Electronically Signed   By: David  Martinique   On: 01/22/2014 18:18    Review of Systems  Constitutional: Negative for fever.  HENT: Positive for congestion.   Respiratory: Positive for cough and shortness of breath.   Cardiovascular: Negative for chest pain, orthopnea, leg swelling and PND.  Gastrointestinal: Negative for nausea, vomiting, abdominal pain, blood in stool and melena.  Genitourinary: Negative for hematuria.  Musculoskeletal: Negative for myalgias.  Neurological: Negative  for dizziness and weakness.  All other systems reviewed and are  negative.   Blood pressure 100/61, pulse 83, temperature 98 F (36.7 C), temperature source Oral, resp. rate 22, height 6' (1.829 m), weight 158 lb 8.2 oz (71.9 kg), SpO2 95.00%. Physical Exam  Constitutional: He is oriented to person, place, and time. He appears well-developed and well-nourished. No distress.  HENT:  Head: Normocephalic and atraumatic.  Eyes: EOM are normal. Pupils are equal, round, and reactive to light. No scleral icterus.  Neck: Normal range of motion. Neck supple. No JVD present.  Cardiovascular: Normal rate, S1 normal and S2 normal.  An irregularly irregular rhythm present.  No murmur heard. Pulses:      Radial pulses are 2+ on the right side, and 2+ on the left side.       Dorsalis pedis pulses are 2+ on the right side, and 2+ on the left side.  No Carotid Bruit  Respiratory: Effort normal. He has wheezes.  Basilar rales  GI: Soft. Bowel sounds are normal. He exhibits no distension. There is no tenderness.  Musculoskeletal: He exhibits no edema.  Lymphadenopathy:    He has no cervical adenopathy.  Neurological: He is alert and oriented to person, place, and time. He exhibits normal muscle tone.  Skin: Skin is warm.  Psychiatric: He has a normal mood and affect.    Assessment/Plan: Active Problems:   Atrial fibrillation with RVR   CKD (chronic kidney disease), stage III   Mixed hyperlipidemia   Long term current use of anticoagulant   DLBCL (diffuse large B cell lymphoma)   HCAP (healthcare-associated pneumonia)   Chronic systolic congestive heart failure, NYHA class 3   Acute respiratory failure with hypoxia  Plan:  The patient's rate is currently well controlled on Coreg and digoxin.  He was having significant RVR yesterday.  He reports feeling and breathing a lot better.  Last INR was 3.43 yesterday.  BNP 4885.0.  Will repeat in AM.  Very little UOP charted.  This likely will improve with slower heart rate.  Will need to resume PO lasix when BP  tolerates it.  It has been borderline low.  Tarri Fuller 01/23/2014, 2:12 PM    Patient examined chart reviewed Discussed care plan with son ( who just flew in from [place]) and patient.  Agree with rate control on coreg and digoxen.  Will need low dose daily lasix and consideration of ACE depending on BP and Cr He received R-CEOP chemotherapy at end of 2014 Discussed with Dr Jana Hakim  Although this regimen omits adriamycin compounds it is still associated with cardiomyopathy and may be related to his decreased EF.  Temporally  this makes Sense. No chest pain troponin negative and given advanced age would continue medical RX.    Jenkins Rouge

## 2014-01-23 NOTE — Progress Notes (Signed)
  Echocardiogram 2D Echocardiogram has been performed.  Loriann Bosserman, [facility] 01/23/2014, 10:42 AM

## 2014-01-23 NOTE — Evaluation (Signed)
Physical Therapy Evaluation Patient Details Name: Gabriel Estrada MRN: [id number] DOB: [birth year] Today's Date: 01/23/2014 Time: 7782-4235 PT Time Calculation (min): 23 min  PT Assessment / Plan / Recommendation History of Present Illness  Pt is 80 yo male with history of atrial fibrillation on coumadin, systolic CHF, diffuse B cell lymphoma under Dr. Alvy Bimler care and Dr. Lisbeth Renshaw (radiation oncologist), CKD stage III, who presents to [facility] ED with main concern of 2 weeks duration of progressively worsening shortness of breath, cough productive of yellow sputum, fevers, chills, malaise, poor oral intake. He was brought in by ambulance. Pt explains his dyspnea is worse with exertion and somewhat better with resting but no specific alleviating factors, no chest pian, no abdominal or urinary concerns. Pt denies sick contacts or exposures. In ED, pt found to be hypoxic with oxygen saturation in 80's, CXR findings worrisome for PNA. Pt also noted to be in atrial fibrillation and was started on cardizem drip  Clinical Impression  Pt pleasant and eager to return home. Pt states he has never fallen but does state he furniture walks in the house and with shuffle gait and decreased gait velocity is a high fall risk and recommend use of AD with all ambulation. Pt will benefit from acute therapy to maximize mobility, gait and safety to decrease burden of care at DC. Pt with sats dropping to 87% at rest on RA and 95% on 4L with drop to 85% with 20' ambulation with cues for energy conservation and pursed lip breathing. Will follow.     PT Assessment  Patient needs continued PT services    Follow Up Recommendations  Home health PT;Supervision for mobility/OOB    Does the patient have the potential to tolerate intense rehabilitation      Barriers to Discharge Decreased caregiver support      Equipment Recommendations  None recommended by PT    Recommendations for Other Services     Frequency Min 3X/week     Precautions / Restrictions Precautions Precautions: Fall   Pertinent Vitals/Pain HR 70 No pain      Mobility  Bed Mobility Overal bed mobility: Modified Independent Transfers Overall transfer level: Needs assistance Equipment used: None Transfers: Sit to/from Stand Sit to Stand: Supervision General transfer comment: cueing for hand placement and safe descent Ambulation/Gait Ambulation/Gait assistance: Min guard Ambulation Distance (Feet): 40 Feet Assistive device: None Gait Pattern/deviations: Shuffle;Trunk flexed Gait velocity interpretation: Below normal speed for age/gender    Exercises     PT Diagnosis: Difficulty walking  PT Problem List: Decreased strength;Decreased activity tolerance;Decreased balance;Decreased mobility;Cardiopulmonary status limiting activity;Decreased knowledge of use of DME PT Treatment Interventions: Gait training;Stair training;Functional mobility training;Therapeutic activities;Therapeutic exercise;Patient/family education;DME instruction     PT Goals(Current goals can be found in the care plan section) Acute Rehab PT Goals Patient Stated Goal: return home to cooking and get some black coffee PT Goal Formulation: With patient/family Time For Goal Achievement: 02/06/14 Potential to Achieve Goals: Good  Visit Information  Last PT Received On: 01/23/14 Assistance Needed: +1 History of Present Illness: Pt is 80 yo male with history of atrial fibrillation on coumadin, systolic CHF, diffuse B cell lymphoma under Dr. Alvy Bimler care and Dr. Lisbeth Renshaw (radiation oncologist), CKD stage III, who presents to [facility] ED with main concern of 2 weeks duration of progressively worsening shortness of breath, cough productive of yellow sputum, fevers, chills, malaise, poor oral intake. He was brought in by ambulance. Pt explains his dyspnea is worse with exertion and somewhat better with  resting but no specific alleviating factors, no chest pian, no abdominal or urinary  concerns. Pt denies sick contacts or exposures. In ED, pt found to be hypoxic with oxygen saturation in 80's, CXR findings worrisome for PNA. Pt also noted to be in atrial fibrillation and was started on cardizem drip       Prior [place] expects to be discharged to:: Private residence Living Arrangements: Other relatives Available Help at Discharge: Family Type of Home: House Home Access: Stairs to enter Technical brewer of Steps: 6 Entrance Stairs-Rails: Right;Left;Can reach both Home Layout: Two level;Bed/bath upstairs Alternate Level Stairs-Number of Steps: [place]: [place] - 2 wheels;Cane - single point Additional Comments: pt uses cane outside at times he does the cooking and they have a housekeeper. Sister does most of the driving Prior Function Level of Independence: Independent Communication Communication: No difficulties Dominant Hand: Right    Cognition  Cognition Arousal/Alertness: Awake/alert Behavior During Therapy: WFL for tasks assessed/performed Overall Cognitive Status: Within Functional Limits for tasks assessed    Extremity/Trunk Assessment Upper Extremity Assessment Upper Extremity Assessment: Generalized weakness Lower Extremity Assessment Lower Extremity Assessment: Generalized weakness Cervical / Trunk Assessment Cervical / Trunk Assessment: Normal   Balance Balance Overall balance assessment: Needs assistance Sitting balance-Leahy Scale: Good Sitting balance - Comments: able to don and doff socks and shoes with unsupported back Standing balance-Leahy Scale: Fair  End of Session PT - End of Session Equipment Utilized During Treatment: Gait belt;Oxygen Activity Tolerance: Patient tolerated treatment well Patient left: in chair;with call bell/phone within reach;with family/visitor present Nurse Communication: Mobility status  GP     Lanetta Inch Beth 01/23/2014, 1:08 PM Elwyn Reach,  [place]

## 2014-01-23 NOTE — Progress Notes (Signed)
BP 86/49, 86/43. HR sustaining 60's. Pt bradycardic down to 48 non-sustaining. 2.38 second pause noted.  Pt comfortable and resting. No problems noted. NP notified. No new orders at this time. Will continue to monitor.  M.Forest Gleason, RN

## 2014-01-23 NOTE — Procedures (Addendum)
Objective Swallowing Evaluation: Modified Barium Swallowing Study  Patient Details  Name: Gabriel Estrada MRN: [id number] Date of Birth: Apr 28, 1928  Today's Date: 01/23/2014 Time: 1350-1415 SLP Time Calculation (min): 25 min  Past Medical History:  Past Medical History  Diagnosis Date  . Coronary artery disease     a. 02/2001 MI/Cath/CABG x 5: LIMA->LAD, VG->D1, VG->OM2, VG->PDA->LPL & MV Ring (#30 Seguin ring annuloplasty)  . PVD (peripheral vascular disease)   . Hyperlipidemia   . Mitral regurgitation     a. 02/2001 s/p #30 Seguin ring annuloplasty.  . Chronic systolic CHF (congestive heart failure)     a. 05/2013 Echo: appears to be signif overall LV dysfxn, cannot estimate EF.  Ao sclerosis;  b. 06/2013 Echo: Poor windows, ? septal and apical HK, mild LVH, EF likely low normal  . Hypertension   . CKD (chronic kidney disease), stage III   . Edentulous     all remaining teeth pulled-05-30-13  . A-fib     a. chronic-Dr. Hochrein,cardiology;  b. on coumadin.  [name] Lymphoma     Nasal Mass-left-Dr. Ha,oncology  . Bladder mass 07-03-13  . Carotid artery disease     a. 09/2012 U/S: 0-39% bilat ICA stenosis.  . Hemoptysis 12/25/2013   Past Surgical History:  Past Surgical History  Procedure Laterality Date  . Coronary artery bypass graft  03/15/01    x 5  . Multiple extractions with alveoloplasty N/A 05/30/2013    Procedure: Extraction of tooth #'s 2,6,7,8,10,11,12,18,20,21,22,23,24,25,26,27,28,29 with alveoloplasty and mandibular tori reductions, and lateral exostoses reductions.;  Surgeon: Lenn Cal, DDS;  Location: [place];  Service: Oral Surgery;  Laterality: N/A;  . Soft tissue biopsy       Diffuse B-Cell Lynphoma/ Nasal mucosa, biopsy, left,left nasal mass  . Portacath placement Right     right chest  . Cystoscopy w/ retrogrades Bilateral 07/08/2013    Procedure: CYSTOSCOPY WITH RETROGRADE PYELOGRAM;  Surgeon: Dutch Gray, MD;  Location: [facility];  Service: Urology;  Laterality:  Bilateral;  GENERAL ANESTHESIA WITH PARALYSIS, EXAM UNDER ANESTHESIA       HPI:  Pt is 80 yo male with history of atrial fibrillation on coumadin, systolic CHF, diffuse B cell lymphoma under Dr. Alvy Bimler care and Dr. Lisbeth Renshaw (radiation oncologist), CKD stage III, who presents to [facility] ED with main concern of 2 weeks duration of progressively worsening shortness of breath, cough productive of yellow sputum, fevers, chills, malaise, poor oral intake. He was brought in by ambulance. Pt explains his dyspnea is worse with exertion and somewhat better with resting but no specific alleviating factors, no chest pian, no abdominal or urinary concerns. Pt denies sick contacts or exposures. In ED, pt found to be hypoxic with oxygen saturation in 80's, CXR findings worrisome for PNA. Pt also noted to be in atrial fibrillation and was started on cardizem drip      Assessment / Plan / Recommendation Clinical Impression  Dysphagia Diagnosis: Mild pharyngeal phase dysphagia Clinical impression: Pt presents with a mild oropharyngeal dysphagia with flash and trace frank penetration of airway during the swallow. There was one instance where a trace amount passed the vocal folds, was sensed and ejected out. The pt has decreased laryngeal closure during the swallow allowing liquids and even solids to enter vestibule and are typically ejected out. Do not anticipate that this pt would be a risk of significant aspiration based on performance during todays study. Despite this, he could benefit from aspiration precautions to reduce risk. Recommend Regular diet with  thin liquids, no straws, small single sips. An intermittent throat clear would also be helpful. SLP will f/u x1 to reinforce precautions given to pt today.     Treatment Recommendation  Therapy as outlined in treatment plan below    Diet Recommendation Regular;Thin liquid   Liquid Administration via: Cup;Straw Medication Administration: Whole meds with  liquid Supervision: Patient able to self feed Compensations: Small sips/bites;Clear throat intermittently Postural Changes and/or Swallow Maneuvers: Seated upright 90 degrees;Out of bed for meals    Other  Recommendations Recommended Consults: MBS Oral Care Recommendations: Oral care BID   Follow Up Recommendations  None    Frequency and Duration min 1 x/week  1 week   Pertinent Vitals/Pain NA    SLP Swallow Goals     General HPI: Pt is 80 yo male with history of atrial fibrillation on coumadin, systolic CHF, diffuse B cell lymphoma under Dr. Alvy Bimler care and Dr. Lisbeth Renshaw (radiation oncologist), CKD stage III, who presents to [facility] ED with main concern of 2 weeks duration of progressively worsening shortness of breath, cough productive of yellow sputum, fevers, chills, malaise, poor oral intake. He was brought in by ambulance. Pt explains his dyspnea is worse with exertion and somewhat better with resting but no specific alleviating factors, no chest pian, no abdominal or urinary concerns. Pt denies sick contacts or exposures. In ED, pt found to be hypoxic with oxygen saturation in 80's, CXR findings worrisome for PNA. Pt also noted to be in atrial fibrillation and was started on cardizem drip  Type of Study: Modified Barium Swallowing Study Reason for Referral: Objectively evaluate swallowing function Previous Swallow Assessment: none Diet Prior to this Study: Regular;Thin liquids Temperature Spikes Noted: No Respiratory Status: Nasal cannula History of Recent Intubation: No Behavior/Cognition: Alert;Cooperative;Pleasant mood Oral Cavity - Dentition: Edentulous Oral Motor / Sensory Function: Within functional limits Self-Feeding Abilities: Able to feed self Patient Positioning: Upright in chair Baseline Vocal Quality: Clear Volitional Cough: Strong Volitional Swallow: Able to elicit Anatomy: Within functional limits Pharyngeal Secretions: Normal    Reason for Referral Objectively  evaluate swallowing function   Oral Phase Oral Preparation/Oral Phase Oral Phase: WFL   Pharyngeal Phase Pharyngeal Phase Pharyngeal Phase: Impaired Pharyngeal - Thin Pharyngeal - Thin Cup: Delayed swallow initiation;Reduced epiglottic inversion;Reduced airway/laryngeal closure;Reduced laryngeal elevation;Penetration/Aspiration during swallow;Trace aspiration;Compensatory strategies attempted (Comment) (chin tuck, ineffective) Penetration/Aspiration details (thin cup): Material enters airway, passes BELOW cords then ejected out;Material enters airway, CONTACTS cords then ejected out;Material enters airway, remains ABOVE vocal cords then ejected out Pharyngeal - Thin Straw: Delayed swallow initiation;Reduced epiglottic inversion;Reduced airway/laryngeal closure;Reduced laryngeal elevation;Penetration/Aspiration during swallow Penetration/Aspiration details (thin straw): Material enters airway, CONTACTS cords then ejected out;Material enters airway, remains ABOVE vocal cords then ejected out Pharyngeal - Solids Pharyngeal - Puree: Delayed swallow initiation;Reduced epiglottic inversion;Reduced airway/laryngeal closure;Reduced laryngeal elevation;Penetration/Aspiration during swallow Penetration/Aspiration details (puree): Material enters airway, remains ABOVE vocal cords then ejected out Pharyngeal - Regular: Delayed swallow initiation;Reduced epiglottic inversion;Reduced airway/laryngeal closure;Reduced laryngeal elevation;Penetration/Aspiration during swallow Penetration/Aspiration details (regular): Material enters airway, remains ABOVE vocal cords then ejected out Pharyngeal - Pill: Delayed swallow initiation;Reduced epiglottic inversion;Reduced airway/laryngeal closure;Reduced laryngeal elevation;Penetration/Aspiration during swallow Penetration/Aspiration details (pill): Material enters airway, remains ABOVE vocal cords then ejected out  Cervical Esophageal Phase    GO    Cervical  Esophageal Phase Cervical Esophageal Phase: WFL (prominent CP, asymptomatic)        Herbie Baltimore, MA CCC-SLP [phone]  Lynann Beaver 01/23/2014, 2:34 PM

## 2014-01-23 NOTE — Progress Notes (Signed)
ANTICOAGULATION CONSULT NOTE - Initial Consult  Pharmacy Consult for warfarin Indication: atrial fibrillation  No Known Allergies  Patient Measurements: Height: 6' (182.9 cm) Weight: 158 lb 8.2 oz (71.9 kg) IBW/kg (Calculated) : 77.6   Vital Signs: Temp: 98.2 F (36.8 C) (01/29 1600) Temp src: Axillary (01/29 1600) BP: 110/77 mmHg (01/29 1400) Pulse Rate: 57 (01/29 1400)  Labs:  Recent Labs  01/22/14 1816 01/22/14 2100 01/23/14 0430 01/23/14 0440 01/23/14 1021 01/23/14 1330  HGB 13.6  --   --  9.8*  --   --   HCT 41.8  --   --  30.6*  --   --   PLT 184  --   --  145*  --   --   LABPROT 33.3*  --   --   --   --  34.9*  INR 3.43*  --   --   --   --  3.65*  CREATININE 1.53*  --   --  1.61*  --   --   TROPONINI  --  <0.30 <0.30  --  <0.30  --     Estimated Creatinine Clearance: 35.4 ml/min (by C-G formula based on Cr of 1.61).   Medical History: Past Medical History  Diagnosis Date  . Coronary artery disease     a. 02/2001 MI/Cath/CABG x 5: LIMA->LAD, VG->D1, VG->OM2, VG->PDA->LPL & MV Ring (#30 Seguin ring annuloplasty)  . PVD (peripheral vascular disease)   . Hyperlipidemia   . Mitral regurgitation     a. 02/2001 s/p #30 Seguin ring annuloplasty.  . Chronic systolic CHF (congestive heart failure)     a. 05/2013 Echo: appears to be signif overall LV dysfxn, cannot estimate EF.  Ao sclerosis;  b. 06/2013 Echo: Poor windows, ? septal and apical HK, mild LVH, EF likely low normal  . Hypertension   . CKD (chronic kidney disease), stage III   . Edentulous     all remaining teeth pulled-05-30-13  . A-fib     a. chronic-Dr. Hochrein,cardiology;  b. on coumadin.  [name] Lymphoma     Nasal Mass-left-Dr. Ha,oncology  . Bladder mass 07-03-13  . Carotid artery disease     a. 09/2012 U/S: 0-39% bilat ICA stenosis.  . Hemoptysis 12/25/2013    Medications:  Prescriptions prior to admission  Medication Sig Dispense Refill  . carvedilol (COREG) 6.25 MG tablet Take 6.25 mg by  mouth 2 (two) times daily with a meal.      . cholecalciferol (VITAMIN D) 1000 UNITS tablet Take 1 tablet (1,000 Units total) by mouth daily.  100 tablet  3  . digoxin (LANOXIN) 0.125 MG tablet Take 0.125 mg by mouth daily.      [name] docusate sodium (COLACE) 100 MG capsule Take 100 mg by mouth 2 (two) times daily.      [name] ezetimibe-simvastatin (VYTORIN) 10-40 MG per tablet Take 1 tablet by mouth at bedtime.      . furosemide (LASIX) 20 MG tablet Take 20 mg by mouth as needed for fluid or edema (for emergent use).      . Multiple Vitamin (MULTIVITAMIN WITH MINERALS) TABS tablet Take 1 tablet by mouth daily.      . tamsulosin (FLOMAX) 0.4 MG CAPS capsule Take 0.4 mg by mouth at bedtime.      [name] warfarin (COUMADIN) 5 MG tablet Take 2.5-5 mg by mouth daily at 6 PM. 5mg /day except take 2.5mg  on Thursday and Sunday        Assessment: 80 year old  man on warfarin prior to admission for AFib.  INR drawn today is up to 3.65. Goal of Therapy:  INR 2-3    Plan:  Will hold warfarin today.   Continue daily protimes.  Candie Mile 01/23/2014,4:43 PM

## 2014-01-24 ENCOUNTER — Telehealth: Payer: Self-pay | Admitting: Hematology and Oncology

## 2014-01-24 ENCOUNTER — Other Ambulatory Visit: Payer: Self-pay | Admitting: Hematology and Oncology

## 2014-01-24 DIAGNOSIS — I2589 Other forms of chronic ischemic heart disease: Secondary | ICD-10-CM

## 2014-01-24 DIAGNOSIS — I2510 Atherosclerotic heart disease of native coronary artery without angina pectoris: Secondary | ICD-10-CM

## 2014-01-24 DIAGNOSIS — I255 Ischemic cardiomyopathy: Secondary | ICD-10-CM | POA: Diagnosis present

## 2014-01-24 LAB — EXPECTORATED SPUTUM ASSESSMENT W GRAM STAIN, RFLX TO RESP C

## 2014-01-24 LAB — COMPREHENSIVE METABOLIC PANEL
ALK PHOS: 63 U/L (ref 39–117)
ALT: 12 U/L (ref 0–53)
AST: 16 U/L (ref 0–37)
Albumin: 2.8 g/dL — ABNORMAL LOW (ref 3.5–5.2)
BILIRUBIN TOTAL: 0.7 mg/dL (ref 0.3–1.2)
BUN: 38 mg/dL — ABNORMAL HIGH (ref 6–23)
CHLORIDE: 103 meq/L (ref 96–112)
CO2: 26 meq/L (ref 19–32)
Calcium: 8.1 mg/dL — ABNORMAL LOW (ref 8.4–10.5)
Creatinine, Ser: 1.6 mg/dL — ABNORMAL HIGH (ref 0.50–1.35)
GFR, EST AFRICAN AMERICAN: 44 mL/min — AB (ref >90)
GFR, EST NON AFRICAN AMERICAN: 38 mL/min — AB (ref >90)
GLUCOSE: 91 mg/dL (ref 70–99)
POTASSIUM: 4.7 meq/L (ref 3.7–5.3)
SODIUM: 140 meq/L (ref 137–147)
TOTAL PROTEIN: 5.3 g/dL — AB (ref 6.0–8.3)

## 2014-01-24 LAB — CBC
HCT: 28.8 % — ABNORMAL LOW (ref 39.0–52.0)
Hemoglobin: 9.5 g/dL — ABNORMAL LOW (ref 13.0–17.0)
MCH: 30.2 pg (ref 26.0–34.0)
MCHC: 33 g/dL (ref 30.0–36.0)
MCV: 91.4 fL (ref 78.0–100.0)
Platelets: 124 10*3/uL — ABNORMAL LOW (ref 150–400)
RBC: 3.15 MIL/uL — ABNORMAL LOW (ref 4.22–5.81)
RDW: 15.9 % — ABNORMAL HIGH (ref 11.5–15.5)
WBC: 1.9 10*3/uL — AB (ref 4.0–10.5)

## 2014-01-24 LAB — RESPIRATORY VIRUS PANEL
ADENOVIRUS: NOT DETECTED
INFLUENZA A H1: NOT DETECTED
INFLUENZA A H3: NOT DETECTED
INFLUENZA B 1: NOT DETECTED
Influenza A: NOT DETECTED
METAPNEUMOVIRUS: NOT DETECTED
Parainfluenza 1: NOT DETECTED
Parainfluenza 2: NOT DETECTED
Parainfluenza 3: NOT DETECTED
RESPIRATORY SYNCYTIAL VIRUS A: NOT DETECTED
Respiratory Syncytial Virus B: NOT DETECTED
Rhinovirus: NOT DETECTED

## 2014-01-24 LAB — PROTIME-INR
INR: 3.37 — AB (ref 0.00–1.49)
PROTHROMBIN TIME: 32.9 s — AB (ref 11.6–15.2)

## 2014-01-24 LAB — PRO B NATRIURETIC PEPTIDE: Pro B Natriuretic peptide (BNP): 5238 pg/mL — ABNORMAL HIGH (ref 0–450)

## 2014-01-24 LAB — EXPECTORATED SPUTUM ASSESSMENT W REFEX TO RESP CULTURE

## 2014-01-24 MED ORDER — SODIUM CHLORIDE 0.9 % IV SOLN
INTRAVENOUS | Status: DC
  Administered 2014-01-24 – 2014-01-27 (×4): via INTRAVENOUS

## 2014-01-24 MED ORDER — WARFARIN SODIUM 1 MG PO TABS
1.0000 mg | ORAL_TABLET | Freq: Once | ORAL | Status: AC
  Administered 2014-01-24: 1 mg via ORAL
  Filled 2014-01-24: qty 1

## 2014-01-24 MED ORDER — FUROSEMIDE 40 MG PO TABS
40.0000 mg | ORAL_TABLET | Freq: Every day | ORAL | Status: DC
  Administered 2014-01-24: 40 mg via ORAL
  Filled 2014-01-24 (×2): qty 1

## 2014-01-24 MED ORDER — WARFARIN - PHARMACIST DOSING INPATIENT
Freq: Every day | Status: DC
  Administered 2014-01-25: 18:00:00

## 2014-01-24 NOTE — Progress Notes (Signed)
ANTICOAGULATION CONSULT NOTE - Follow-up Consult  Pharmacy Consult for warfarin Indication: atrial fibrillation  No Known Allergies  Patient Measurements: Height: 6' (182.9 cm) Weight: 163 lb 2.3 oz (74 kg) IBW/kg (Calculated) : 77.6   Vital Signs: Temp: 98.7 F (37.1 C) (01/30 0821) Temp src: Oral (01/30 0821) BP: 107/71 mmHg (01/30 0821) Pulse Rate: 83 (01/30 0821)  Labs:  Recent Labs  01/22/14 1816 01/22/14 2100 01/23/14 0430 01/23/14 0440 01/23/14 1021 01/23/14 1330 01/24/14 0340  HGB 13.6  --   --  9.8*  --   --  9.5*  HCT 41.8  --   --  30.6*  --   --  28.8*  PLT 184  --   --  145*  --   --  124*  LABPROT 33.3*  --   --   --   --  34.9* 32.9*  INR 3.43*  --   --   --   --  3.65* 3.37*  CREATININE 1.53*  --   --  1.61*  --   --  1.60*  TROPONINI  --  <0.30 <0.30  --  <0.30  --   --     Estimated Creatinine Clearance: 36.6 ml/min (by C-G formula based on Cr of 1.6).  Assessment: 80 year old man on warfarin prior to admission for AFib.  INR= 3.37 (supratherapeutic but trending down). Home dose 5mg  daily except for 2.5mg  on Sun and Thur. Hgb down to 9.5 and plt down to 124. No bleeding noted.   Goal of Therapy:  INR 2-3    Plan:  Coumadin 1 mg today Daily INR  Sherlon Handing, PharmD, BCPS Clinical pharmacist, pager [phone] 01/24/2014,9:34 AM

## 2014-01-24 NOTE — Progress Notes (Signed)
Medicare Important Message given. Cyncere Sontag J. Braxtin Bamba, RN, BSN, NCM [phone].   

## 2014-01-24 NOTE — Clinical Documentation Improvement (Addendum)
Presents with acute on chronic systolic CHF, pneumonia, history of diffuse large B cell lymphoma; being treated at this point with RT.  Patient with abnormal CBC.   WBC = 1.9  Platelets = 124  H&H = 9.5 / 28.8  Please provide a diagnosis associate with above clinical data and treatment provided.   Chronic and secondary to B-Cell lymphoma and chemotherapy. Thanks..   Thank You, Zoila Shutter ,RN Clinical Documentation Specialist:  [place] [employer]

## 2014-01-24 NOTE — Progress Notes (Signed)
Pts. SBP was in the low  80's taken both arms but patient is asymptomatic. K. SchorrNP was notified.

## 2014-01-24 NOTE — Progress Notes (Signed)
Gabriel Estrada  Gabriel Estrada YOV:[id number] DOB: [birth year] DOA: 01/22/2014 PCP: Walker Kehr, MD  Brief narrative: 80 yo male patient with chronic atrial fibrillation on Coumadin, systolic heart failure, diffuse B-cell lymphoma under care of oncology as well as chronic kidney disease stage III. Patient presented to the emergency department with complaints of shortness of breath over 2 weeks that has been progressive in nature. This was associated with productive cough of yellow sputum, fevers she also malaise and poor oral intake. He was brought in by EMS. He does endorse dyspnea on exertion that seems to improve somewhat with resting. No chest pain. In the emergency department patient was found to be hypoxic with O2 saturations 80%. Chest x-ray was concerning for pneumonia. He was noted to be in atrial fibrillation with RVR and was started on a Cardizem drip by the emergency room physician.  Assessment/Plan: Active Problems: Acute respiratory failure with hypoxia:   A) HCAP (healthcare-associated pneumonia)   B) Acute Systolic heart exacerbation -predominant right side so need to exclude aspiration- SLP eval: no dysphagia and rec regular diet -cont broad spectrum anbx's-add Zithromax-completed chemotherapy at the end of October and is apparently on radiation therapy with noted white cell count trending down to 1300 with platelets down to 124,000 -cont supportive care -Flu PCR neg- resp viral panel pending -urinary strep and legionella negative -Cards starting Lasix -weaning oxygen    Atrial fibrillation with RVR -rate rapidly controlled so initial Cardizem gtt dc'd (also became hypotensive) so was transitioned to home digoxin and Coreg -possibly volume depleted/water deficit at admit calculated at 0.61 L -TSH normal    CKD (chronic kidney disease), stage III -mild renal insufficiency -received IVFs for 8 hours    Chronic systolic congestive  heart failure, NYHA class 3 -FU echo this admit shows progression 35-40% with severe hypokinesis of entire inferolateral and inferior myocardium-this has progressed since previous echo: last echo with EF in 2013 was 45-50% and NO RWMA-echo July 2014 ? Septal and apical hypokinesis-MUGA June 2014 with EF 27% -unclear if lower EF from asymptomatic RVR or due to ischemia esp in setting of RWMA and known CAD -? Chemotherapy induce CM?- received Rituxan/Cytoxan/Etoposide/Vincristine from 08/30/13 to 10/16/13 -Lasix per Cards-add ACE I when BP can support -also found with pulmonary HTN 65 mmHg -consulted Cardiology    Mixed hyperlipidemia    Long term current use of anticoagulant -INR >3.0 at admit do repeat today -Pharmacy to dose/manage    DLBCL (diffuse large B cell lymphoma) -last chemo as above and currently on rad tx   DVT prophylaxis: Warfarin Code Status: Full Family Communication: No family at bedside Disposition Plan/Expected LOS: Step down   Consultants: Cardiology  Procedures: 2-D echocardiogram - Left ventricle: The cavity size was mildly dilated. Wall thickness was normal. Systolic function was moderately reduced. The estimated ejection fraction was in the range of 35% to 40%. Severe hypokinesis of the entireinferolateral and inferior myocardium. - Mitral valve: Mild regurgitation. - Right ventricle: Systolic function was mildly reduced. - Right atrium: The atrium was mildly dilated. - Pulmonary arteries: Systolic pressure was moderately increased. PA peak pressure: 59mm Hg (S).   Antibiotics: Zithromax 1/29 >>> Cefepime 1/28 >>> Vancomycin 1/28 >>>  HPI/Subjective: Pt awake and sitting on side of bed. Denies SOB .  Objective: Blood pressure 107/71, pulse 83, temperature 98.7 F (37.1 C), temperature source Oral, resp. rate 18, height 6' (1.829 m), weight 163 lb 2.3 oz (74 kg), SpO2  97.00%.  Intake/Output Summary (Last 24 hours) at 01/24/14 0941 Last data  filed at 01/24/14 0800  Gross per 24 hour  Intake   1010 ml  Output    550 ml  Net    460 ml     Exam: General: No acute respiratory distress Lungs: Right side crackles, has weaned from Ventimask to 2 L nasal cannula Cardiovascular: Irregular rate and rhythm without murmur gallop or rub normal S1 and S2, no peripheral edema or JVD Abdomen: Nontender, nondistended, soft, bowel sounds positive, no rebound, no ascites, no appreciable mass Musculoskeletal: No significant cyanosis, clubbing of bilateral lower extremities Neurological: Alert and oriented x 3, moves all extremities x 4 without focal neurological deficits, CN 2-12 intact  Scheduled Meds:  Scheduled Meds: . azithromycin  500 mg Intravenous Q24H  . carvedilol  6.25 mg Oral BID WC  . ceFEPime (MAXIPIME) IV  2 g Intravenous Q24H  . digoxin  0.125 mg Oral Daily  . docusate sodium  100 mg Oral BID  . ezetimibe-simvastatin  1 tablet Oral QHS  . furosemide  40 mg Oral Daily  . levalbuterol  1.25 mg Nebulization TID  . vancomycin  1,000 mg Intravenous Q24H   Continuous Infusions: . sodium chloride 20 mL/hr at 01/24/14 0700    Data Reviewed: Basic Metabolic Panel:  Recent Labs Lab 01/22/14 1816 01/23/14 0440 01/24/14 0340  NA 142 140 140  K 4.9 4.9 4.7  CL 101 104 103  CO2 25 26 26   GLUCOSE 154* 120* 91  BUN 35* 36* 38*  CREATININE 1.53* 1.61* 1.60*  CALCIUM 8.9 8.2* 8.1*   Liver Function Tests:  Recent Labs Lab 01/24/14 0340  AST 16  ALT 12  ALKPHOS 63  BILITOT 0.7  PROT 5.3*  ALBUMIN 2.8*   No results found for this basename: LIPASE, AMYLASE,  in the last 168 hours No results found for this basename: AMMONIA,  in the last 168 hours CBC:  Recent Labs Lab 01/22/14 1816 01/23/14 0440 01/24/14 0340  WBC 2.4* 2.3* 1.9*  HGB 13.6 9.8* 9.5*  HCT 41.8 30.6* 28.8*  MCV 92.1 91.3 91.4  PLT 184 145* 124*   Cardiac Enzymes:  Recent Labs Lab 01/22/14 2100 01/23/14 0430 01/23/14 1021  TROPONINI  <0.30 <0.30 <0.30   BNP (last 3 results)  Recent Labs  07/22/13 1250 01/22/14 1816 01/24/14 0340  PROBNP 11199.0* 4885.0* 5238.0*   CBG:  Recent Labs Lab 01/23/14 0031  GLUCAP 98    Recent Results (from the past 240 hour(s))  CULTURE, BLOOD (ROUTINE X 2)     Status: None   Collection Time    01/22/14  7:21 PM      Result Value Range Status   Specimen Description BLOOD RIGHT ARM   Final   Special Requests BOTTLES DRAWN AEROBIC ONLY 5CC   Final   Culture  Setup Time     Final   Value: 01/23/2014 00:59     Performed at [facility]   Culture     Final   Value:        BLOOD CULTURE RECEIVED NO GROWTH TO DATE CULTURE WILL BE HELD FOR 5 DAYS BEFORE ISSUING A FINAL NEGATIVE REPORT     Performed at [facility]   Report Status PENDING   Incomplete  CULTURE, BLOOD (ROUTINE X 2)     Status: None   Collection Time    01/22/14  7:30 PM      Result Value Range Status  Specimen Description BLOOD RIGHT HAND   Final   Special Requests BOTTLES DRAWN AEROBIC ONLY 10CC   Final   Culture  Setup Time     Final   Value: 01/23/2014 00:59     Performed at [facility]   Culture     Final   Value:        BLOOD CULTURE RECEIVED NO GROWTH TO DATE CULTURE WILL BE HELD FOR 5 DAYS BEFORE ISSUING A FINAL NEGATIVE REPORT     Performed at [facility]   Report Status PENDING   Incomplete  MRSA PCR SCREENING     Status: None   Collection Time    01/22/14  9:53 PM      Result Value Range Status   MRSA by PCR NEGATIVE  NEGATIVE Final   Comment:            The GeneXpert MRSA Assay (FDA     approved for NASAL specimens     only), is one component of a     comprehensive MRSA colonization     surveillance program. It is not     intended to diagnose MRSA     infection nor to guide or     monitor treatment for     MRSA infections.  URINE CULTURE     Status: None   Collection Time    01/22/14 10:15 PM      Result Value Range Status   Specimen Description URINE,  RANDOM   Final   Special Requests NONE   Final   Culture  Setup Time     Final   Value: 01/23/2014 00:03     Performed at [facility] Count     Final   Value: 30,000 COLONIES/ML     Performed at [facility]   Culture     Final   Value: Multiple bacterial morphotypes present, none predominant. Suggest appropriate recollection if clinically indicated.     Performed at [facility]   Report Status 01/23/2014 FINAL   Final     Studies:  Recent x-ray studies have been reviewed in detail by the Attending Physician  Time spent :     Erin Hearing, [place] [facility] Office  [phone] Pager [phone]  **If unable to reach the above provider after paging please contact the [facility] @ [phone]  On-Call/Text Page:      [email or web address]      password [email or web address]  If 7PM-7AM, please contact night-coverage [email or web address] Password [facility] 01/24/2014, 9:41 AM   LOS: 2 days   I have examined the patient, reviewed the chart and modified the above Estrada which I agree with.   GTXMIW,OEHOZ,YY [phone] 01/24/2014, 5:20 PM

## 2014-01-24 NOTE — Progress Notes (Addendum)
     SUBJECTIVE: No chest pain or SOB.   BP 114/59  Pulse 64  Temp(Src) 98 F (36.7 C) (Oral)  Resp 21  Ht 6' (1.829 m)  Wt 163 lb 2.3 oz (74 kg)  BMI 22.12 kg/m2  SpO2 96%  Intake/Output Summary (Last 24 hours) at 01/24/14 0700 Last data filed at 01/24/14 0400  Gross per 24 hour  Intake    520 ml  Output    450 ml  Net     70 ml    PHYSICAL EXAM General: Well developed, well nourished, in no acute distress. Alert and oriented x 3.  Psych:  Good affect, responds appropriately Neck: No JVD. No masses noted.  Lungs: Clear bilaterally with no wheezes or rhonci noted.  Heart: Irreg irreg with no murmurs noted. Abdomen: Bowel sounds are present. Soft, non-tender.  Extremities: No lower extremity edema.   LABS: Basic Metabolic Panel:  Recent Labs  01/23/14 0440 01/24/14 0340  NA 140 140  K 4.9 4.7  CL 104 103  CO2 26 26  GLUCOSE 120* 91  BUN 36* 38*  CREATININE 1.61* 1.60*  CALCIUM 8.2* 8.1*   CBC:  Recent Labs  01/23/14 0440 01/24/14 0340  WBC 2.3* 1.9*  HGB 9.8* 9.5*  HCT 30.6* 28.8*  MCV 91.3 91.4  PLT 145* 124*   Cardiac Enzymes:  Recent Labs  01/22/14 2100 01/23/14 0430 01/23/14 1021  TROPONINI <0.30 <0.30 <0.30   Current Meds: . azithromycin  500 mg Intravenous Q24H  . carvedilol  6.25 mg Oral BID WC  . ceFEPime (MAXIPIME) IV  2 g Intravenous Q24H  . digoxin  0.125 mg Oral Daily  . docusate sodium  100 mg Oral BID  . ezetimibe-simvastatin  1 tablet Oral QHS  . levalbuterol  1.25 mg Nebulization TID  . 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 His last Echo was today, 01/23/14 and EF is 35-40%, severe hypokinesis of the entireinferolateral and inferior myocardium. Prior to this study, the last reading echo was  03/27/12 and the EF was 45-50% with normal wall motion. He had MUGA scan June 19, 2013 and his EF was calculated at 27%, with global hypokinesia of the left ventricle greatest at apex. He is on coumadin. No ischemia noted on myoview. He underwent 3 cycles of chemotherapy for B-cell lymphoma-non-Hodgkin's lymphoma consisting of RCEOP(Rituxan/Cytoxan/Etoposide/Vincristine/ Prednisone)08/30/2013 to 10/16/2013. He was admitted 01/22/14 with SOB, acute respiratory failure, and diagnosed with HCAP, afib RVR.  1. Chronic persistent Atrial fibrillation, recent RVR: Rate controlled. Continue beta blocker and digoxin. Chronically anti-coagulated on coumadin. INR 3.3 this am.   2. Cardiomyopathy: LV dysfunction likely combination of ischemic and non-ischemic. He is known to have CAD with prior CABG. Also has received chemotherapy recently which could contribute to LV dysfunction. LV dysfunction is not new and has been noted by prior studies. Continue medical management. No invasive evaluation is planned. Resume low dose Lasix today. BP has been soft so no Ace-inh or ARB has been added.   3. CAD: Stable. No chest pain.    4. Acute on chronic systolic CHF: Add Lasix today  5. Chronic kidney disease: stable  Afia Messenger  1/30/20157:00 AM

## 2014-01-24 NOTE — Telephone Encounter (Signed)
called pt home # and pt is in the hospital and prefers that i mail....mailed pt appt sched, avs and letter

## 2014-01-25 DIAGNOSIS — I5043 Acute on chronic combined systolic (congestive) and diastolic (congestive) heart failure: Principal | ICD-10-CM

## 2014-01-25 DIAGNOSIS — C8589 Other specified types of non-Hodgkin lymphoma, extranodal and solid organ sites: Secondary | ICD-10-CM

## 2014-01-25 LAB — CBC WITH DIFFERENTIAL/PLATELET
BASOS ABS: 0 10*3/uL (ref 0.0–0.1)
BASOS PCT: 0 % (ref 0–1)
Eosinophils Absolute: 0.2 10*3/uL (ref 0.0–0.7)
Eosinophils Relative: 8 % — ABNORMAL HIGH (ref 0–5)
HCT: 30.2 % — ABNORMAL LOW (ref 39.0–52.0)
HEMOGLOBIN: 9.7 g/dL — AB (ref 13.0–17.0)
LYMPHS PCT: 27 % (ref 12–46)
Lymphs Abs: 0.6 10*3/uL — ABNORMAL LOW (ref 0.7–4.0)
MCH: 29.4 pg (ref 26.0–34.0)
MCHC: 32.1 g/dL (ref 30.0–36.0)
MCV: 91.5 fL (ref 78.0–100.0)
Monocytes Absolute: 0.7 10*3/uL (ref 0.1–1.0)
Monocytes Relative: 30 % — ABNORMAL HIGH (ref 3–12)
NEUTROS PCT: 35 % — AB (ref 43–77)
Neutro Abs: 0.9 10*3/uL — ABNORMAL LOW (ref 1.7–7.7)
Platelets: 137 10*3/uL — ABNORMAL LOW (ref 150–400)
RBC: 3.3 MIL/uL — ABNORMAL LOW (ref 4.22–5.81)
RDW: 15.7 % — ABNORMAL HIGH (ref 11.5–15.5)
WBC: 2.4 10*3/uL — AB (ref 4.0–10.5)

## 2014-01-25 LAB — BASIC METABOLIC PANEL
BUN: 38 mg/dL — ABNORMAL HIGH (ref 6–23)
CO2: 25 mEq/L (ref 19–32)
Calcium: 8.4 mg/dL (ref 8.4–10.5)
Chloride: 103 mEq/L (ref 96–112)
Creatinine, Ser: 1.69 mg/dL — ABNORMAL HIGH (ref 0.50–1.35)
GFR, EST AFRICAN AMERICAN: 41 mL/min — AB (ref >90)
GFR, EST NON AFRICAN AMERICAN: 36 mL/min — AB (ref >90)
GLUCOSE: 97 mg/dL (ref 70–99)
POTASSIUM: 4.3 meq/L (ref 3.7–5.3)
SODIUM: 141 meq/L (ref 137–147)

## 2014-01-25 LAB — PROTIME-INR
INR: 2.48 — AB (ref 0.00–1.49)
Prothrombin Time: 26 seconds — ABNORMAL HIGH (ref 11.6–15.2)

## 2014-01-25 MED ORDER — AZITHROMYCIN 500 MG PO TABS
500.0000 mg | ORAL_TABLET | Freq: Every day | ORAL | Status: DC
  Administered 2014-01-25 – 2014-01-27 (×3): 500 mg via ORAL
  Filled 2014-01-25 (×3): qty 1

## 2014-01-25 MED ORDER — SODIUM CHLORIDE 0.9 % IJ SOLN
10.0000 mL | INTRAMUSCULAR | Status: DC | PRN
  Administered 2014-01-25 – 2014-01-27 (×2): 10 mL

## 2014-01-25 MED ORDER — WARFARIN SODIUM 2 MG PO TABS
2.0000 mg | ORAL_TABLET | Freq: Once | ORAL | Status: AC
  Administered 2014-01-25: 2 mg via ORAL
  Filled 2014-01-25: qty 1

## 2014-01-25 NOTE — Progress Notes (Signed)
Gabriel Estrada WVP:[id number] DOB: July 27, 1928 DOA: 01/22/2014 PCP: Walker Kehr, MD  Brief narrative: 80 yo male patient with chronic atrial fibrillation on Coumadin, systolic heart failure, diffuse B-cell lymphoma under care of oncology as well as chronic kidney disease stage III. Patient presented to the emergency department with complaints of shortness of breath over 2 weeks that has been progressive in nature. This was associated with productive cough of yellow sputum, fevers she also malaise and poor oral intake. He was brought in by EMS. He does endorse dyspnea on exertion that seems to improve somewhat with resting. No chest pain. In the emergency department patient was found to be hypoxic with O2 saturations 80%. Chest x-ray was concerning for pneumonia. He was noted to be in atrial fibrillation with RVR and was started on a Cardizem drip by the emergency room physician.  Assessment/Plan:  Acute respiratory failure with hypoxia:   A) HCAP (healthcare-associated pneumonia)   B) Acute Systolic heart exacerbation -predominant right side so need to exclude aspiration- SLP consulted for eval: no dysphagia and rec regular diet -cont broad spectrum antibiotics and Zithromax (to cover for atypical). -completed chemotherapy at the end of October and is apparently on radiation therapy.  (patient with pancytopenia) -cont supportive care -Flu PCR neg -resp viral panel negative as well -urinary strep and legionella negative -will start weaning oxygen off    Atrial fibrillation with RVR -rate controlled -continue beta blocker, digoxin, Coumadin per pharmacy (INR therapeutic) -TSH normal    CKD (chronic kidney disease), stage III -Creatinine slightly elevated. Next line-Lasix will be discontinue at this point is that is no signs of fluid overload. -BMET in a.m.    Chronic systolic congestive heart failure, NYHA class 3 -FU echo this admit shows progression 35-40% with severe  hypokinesis of entire inferolateral and inferior myocardium-this has progressed since previous echo: last echo with EF in 2013 was 45-50% and NO RWMA-echo July 2014 ? Septal and apical hypokinesis-MUGA June 2014 with EF 27% -unclear if lower EF from asymptomatic RVR or due to ischemia esp in setting of RWMA and known CAD -? Chemotherapy induce CM?- received Rituxan/Cytoxan/Etoposide/Vincristine from 08/30/13 to 10/16/13 -Lasix per Cards -add ACE I when BP can support and renal function controlled/stable -also found with pulmonary HTN 65 mmHg -will follow cardiology rec's    Mixed hyperlipidemia -Continue statins    Long term current use of anticoagulant -INR therapeutic -Pharmacy to dose/manage    Pancytopenia  -Appears to be secondary to recent chemotherapy and large B cell lymphoma -will monitor cell count trend   DLBCL (diffuse large B cell lymphoma) -last chemo as above and currently on rad tx -continue outpatient follow up with oncology   DVT prophylaxis: Warfarin  Code Status: Full Family Communication: son at bedside Disposition Plan/Expected LOS: home when medically stable. Will ask PT to evaluate to identify any needs  Consultants: Cardiology  Procedures: 2-D echocardiogram - Left ventricle: The cavity size was mildly dilated. Wall thickness was normal. Systolic function was moderately reduced. The estimated ejection fraction was in the range of 35% to 40%. Severe hypokinesis of the entireinferolateral and inferior myocardium. - Mitral valve: Mild regurgitation. - Right ventricle: Systolic function was mildly reduced. - Right atrium: The atrium was mildly dilated. - Pulmonary arteries: Systolic pressure was moderately increased. PA peak pressure: 78m Hg (S).   Antibiotics: Zithromax 1/29 >>> Cefepime 1/28 >>> Vancomycin 1/28 >>>  HPI/Subjective: Pt breathing better and feeling better. No fever. Denies any chest pain, palpitations  or any other acute  complaints.  Objective: Blood pressure 109/59, pulse 70, temperature 97.5 F (36.4 C), temperature source Oral, resp. rate 20, height 6' (1.829 m), weight 73.2 kg (161 lb 6 oz), SpO2 97.00%.  Intake/Output Summary (Last 24 hours) at 01/25/14 1501 Last data filed at 01/25/14 1400  Gross per 24 hour  Intake   1810 ml  Output   1115 ml  Net    695 ml     Exam: General: No acute respiratory distress; afebrile Lungs: Breathing comfortable on 2 L nasal cannula with good oxygen saturation; no wheezing, decreased breath sounds at bases bilaterally but no frank crackles Cardiovascular: Irregular rate and rhythm without murmur gallop or rub normal S1 and S2, no peripheral edema or JVD Abdomen: Nontender, nondistended, soft, bowel sounds positive, no rebound, no ascites, no appreciable mass Musculoskeletal: No significant cyanosis, clubbing or joint swelling Neurological: Alert and oriented x 3, moves all extremities x 4 without focal neurological deficits, CN 2-12 intact  Scheduled Meds:  Scheduled Meds: . azithromycin  500 mg Intravenous Q24H  . carvedilol  6.25 mg Oral BID WC  . ceFEPime (MAXIPIME) IV  2 g Intravenous Q24H  . digoxin  0.125 mg Oral Daily  . docusate sodium  100 mg Oral BID  . ezetimibe-simvastatin  1 tablet Oral QHS  . levalbuterol  1.25 mg Nebulization TID  . vancomycin  1,000 mg Intravenous Q24H  . warfarin  2 mg Oral ONCE-1800  . Warfarin - Pharmacist Dosing Inpatient   Does not apply q1800   Continuous Infusions: . sodium chloride 20 mL/hr at 01/24/14 2057    Data Reviewed: Basic Metabolic Panel:  Recent Labs Lab 01/22/14 1816 01/23/14 0440 01/24/14 0340 01/25/14 0330  NA 142 140 140 141  K 4.9 4.9 4.7 4.3  CL 101 104 103 103  CO2 25 26 26 25   GLUCOSE 154* 120* 91 97  BUN 35* 36* 38* 38*  CREATININE 1.53* 1.61* 1.60* 1.69*  CALCIUM 8.9 8.2* 8.1* 8.4   Liver Function Tests:  Recent Labs Lab 01/24/14 0340  AST 16  ALT 12  ALKPHOS 63    BILITOT 0.7  PROT 5.3*  ALBUMIN 2.8*   CBC:  Recent Labs Lab 01/22/14 1816 01/23/14 0440 01/24/14 0340 01/25/14 0330  WBC 2.4* 2.3* 1.9* 2.4*  NEUTROABS  --   --   --  0.9*  HGB 13.6 9.8* 9.5* 9.7*  HCT 41.8 30.6* 28.8* 30.2*  MCV 92.1 91.3 91.4 91.5  PLT 184 145* 124* 137*   Cardiac Enzymes:  Recent Labs Lab 01/22/14 2100 01/23/14 0430 01/23/14 1021  TROPONINI <0.30 <0.30 <0.30   BNP (last 3 results)  Recent Labs  07/22/13 1250 01/22/14 1816 01/24/14 0340  PROBNP 11199.0* 4885.0* 5238.0*   CBG:  Recent Labs Lab 01/23/14 0031  GLUCAP 98    Recent Results (from the past 240 hour(s))  CULTURE, BLOOD (ROUTINE X 2)     Status: None   Collection Time    01/22/14  7:21 PM      Result Value Range Status   Specimen Description BLOOD RIGHT ARM   Final   Special Requests BOTTLES DRAWN AEROBIC ONLY 5CC   Final   Culture  Setup Time     Final   Value: 01/23/2014 00:59     Performed at [facility]   Culture     Final   Value:        BLOOD CULTURE RECEIVED NO GROWTH TO DATE CULTURE WILL  BE HELD FOR 5 DAYS BEFORE ISSUING A FINAL NEGATIVE REPORT     Performed at [facility]   Report Status PENDING   Incomplete  CULTURE, BLOOD (ROUTINE X 2)     Status: None   Collection Time    01/22/14  7:30 PM      Result Value Range Status   Specimen Description BLOOD RIGHT HAND   Final   Special Requests BOTTLES DRAWN AEROBIC ONLY 10CC   Final   Culture  Setup Time     Final   Value: 01/23/2014 00:59     Performed at [facility]   Culture     Final   Value:        BLOOD CULTURE RECEIVED NO GROWTH TO DATE CULTURE WILL BE HELD FOR 5 DAYS BEFORE ISSUING A FINAL NEGATIVE REPORT     Performed at [facility]   Report Status PENDING   Incomplete  MRSA PCR SCREENING     Status: None   Collection Time    01/22/14  9:53 PM      Result Value Range Status   MRSA by PCR NEGATIVE  NEGATIVE Final   Comment:            The GeneXpert MRSA Assay  (FDA     approved for NASAL specimens     only), is one component of a     comprehensive MRSA colonization     surveillance program. It is not     intended to diagnose MRSA     infection nor to guide or     monitor treatment for     MRSA infections.  URINE CULTURE     Status: None   Collection Time    01/22/14 10:15 PM      Result Value Range Status   Specimen Description URINE, RANDOM   Final   Special Requests NONE   Final   Culture  Setup Time     Final   Value: 01/23/2014 00:03     Performed at [facility] Count     Final   Value: 30,000 COLONIES/ML     Performed at [facility]   Culture     Final   Value: Multiple bacterial morphotypes present, none predominant. Suggest appropriate recollection if clinically indicated.     Performed at [facility]   Report Status 01/23/2014 FINAL   Final  RESPIRATORY VIRUS PANEL     Status: None   Collection Time    01/23/14 12:38 AM      Result Value Range Status   Source - RVPAN NASOPHARYNGEAL   Final   Respiratory Syncytial Virus A NOT DETECTED   Final   Respiratory Syncytial Virus B NOT DETECTED   Final   Influenza A NOT DETECTED   Final   Influenza B NOT DETECTED   Final   Parainfluenza 1 NOT DETECTED   Final   Parainfluenza 2 NOT DETECTED   Final   Parainfluenza 3 NOT DETECTED   Final   Metapneumovirus NOT DETECTED   Final   Rhinovirus NOT DETECTED   Final   Adenovirus NOT DETECTED   Final   Influenza A H1 NOT DETECTED   Final   Influenza A H3 NOT DETECTED   Final   Comment: (NOTE)           Normal Reference Range for each Analyte: NOT DETECTED     Testing performed using the [employer] xTAG  Respiratory Viral Panel test     kit.     This test was developed and its performance characteristics determined     by [facility]. It has not been cleared or approved by the [place]     Food and Drug Administration. This test is used for clinical purposes.     It should not be regarded as  investigational or for research. This     laboratory is certified under the [place] (CLIA) as qualified to perform high complexity     clinical laboratory testing.     Performed at [place], EXPECTORATED SPUTUM-ASSESSMENT     Status: None   Collection Time    01/24/14  9:00 AM      Result Value Range Status   Specimen Description SPUTUM   Final   Special Requests NONE   Final   Sputum evaluation     Final   Value: THIS SPECIMEN IS ACCEPTABLE. RESPIRATORY CULTURE REPORT TO FOLLOW.   Report Status 01/24/2014 FINAL   Final  CULTURE, RESPIRATORY (NON-EXPECTORATED)     Status: None   Collection Time    01/24/14  9:00 AM      Result Value Range Status   Specimen Description SPUTUM   Final   Special Requests NONE   Final   Gram Stain     Final   Value: FEW WBC PRESENT,BOTH PMN AND MONONUCLEAR     RARE SQUAMOUS EPITHELIAL CELLS PRESENT     RARE GRAM POSITIVE COCCI IN CHAINS     RARE GRAM NEGATIVE RODS     Performed at [facility]   Culture     Final   Value: Culture reincubated for better growth     Performed at [facility]   Report Status PENDING   Incomplete    Time > 30 minutes   Antonae Zbikowski  [phone]  01/25/2014, 3:01 PM   LOS: 3 days

## 2014-01-25 NOTE — Progress Notes (Signed)
ANTICOAGULATION CONSULT NOTE - Follow-up Consult  Pharmacy Consult for warfarin Indication: atrial fibrillation  No Known Allergies  Patient Measurements: Height: 6' (182.9 cm) Weight: 161 lb 6 oz (73.2 kg) (Scale B) IBW/kg (Calculated) : 77.6   Vital Signs: Temp: 97.6 F (36.4 C) (01/31 0548) Temp src: Oral (01/31 0548) BP: 110/61 mmHg (01/31 0548) Pulse Rate: 78 (01/31 0548)  Labs:  Recent Labs  01/22/14 2100 01/23/14 0430 01/23/14 0440 01/23/14 1021 01/23/14 1330 01/24/14 0340 01/25/14 0330  HGB  --   --  9.8*  --   --  9.5* 9.7*  HCT  --   --  30.6*  --   --  28.8* 30.2*  PLT  --   --  145*  --   --  124* 137*  LABPROT  --   --   --   --  34.9* 32.9* 26.0*  INR  --   --   --   --  3.65* 3.37* 2.48*  CREATININE  --   --  1.61*  --   --  1.60* 1.69*  TROPONINI <0.30 <0.30  --  <0.30  --   --   --     Estimated Creatinine Clearance: 34.3 ml/min (by C-G formula based on Cr of 1.69).  Assessment: 80 year old man on warfarin prior to admission for AFib.  Patient's INR was supratherapeutic on admission (INR= 3.43) but has been trending down. No bleeding issues noted.   INR today is therapeutic at 2.48 after holding two doses and receiving a lower dose yesterday from his home regimen. (Home dose 5mg  daily except for 2.5mg  on Sun and Thur.)   We likely have not seen the full effect on the INR from recently held doses, and patient has only received one dose, which was less than his home regimen. Therefore, will increase dose for tonight to avoid patient being subtherapeutic, but will remain conservative due to recent supratherapeutic INR. Patient is also receiving azithromycin which can potentially increase the INR.  Hgb low but stable at 9.7 and plt 137.   Goal of Therapy:  INR 2-3   Plan:  Coumadin 2 mg today Daily INR  Fernando Stoiber C. Seichi Kaufhold, PharmD Clinical Pharmacist-Resident Pager: [phone] Pharmacy: [phone] 01/25/2014 8:05 AM

## 2014-01-25 NOTE — Progress Notes (Signed)
Patient Name: Gabriel Estrada Date of Encounter: 01/25/2014   Principal Problem:   Acute respiratory failure with hypoxia Active Problems:   HCAP (healthcare-associated pneumonia)   Acute on chronic combined systolic and diastolic congestive heart failure, NYHA class 3   Atrial fibrillation with RVR   CKD (chronic kidney disease), stage III   Cardiomyopathy, ischemic   Mixed hyperlipidemia   Long term current use of anticoagulant   DLBCL (diffuse large B cell lymphoma)   SUBJECTIVE  Feels well.  No chest pain, sob, palps.  Eager to go home.  CURRENT MEDS . azithromycin  500 mg Intravenous Q24H  . carvedilol  6.25 mg Oral BID WC  . ceFEPime (MAXIPIME) IV  2 g Intravenous Q24H  . digoxin  0.125 mg Oral Daily  . docusate sodium  100 mg Oral BID  . ezetimibe-simvastatin  1 tablet Oral QHS  . furosemide  40 mg Oral Daily  . levalbuterol  1.25 mg Nebulization TID  . vancomycin  1,000 mg Intravenous Q24H  . warfarin  2 mg Oral ONCE-1800  . Warfarin - Pharmacist Dosing Inpatient   Does not apply q1800   OBJECTIVE  Filed Vitals:   01/25/14 0156 01/25/14 0548 01/25/14 0835 01/25/14 0905  BP: 124/91 110/61  95/58  Pulse: 68 78  86  Temp: 97.8 F (36.6 C) 97.6 F (36.4 C)    TempSrc: Oral Oral    Resp:  20    Height:      Weight:  161 lb 6 oz (73.2 kg)    SpO2: 94% 98% 97%     Intake/Output Summary (Last 24 hours) at 01/25/14 1253 Last data filed at 01/25/14 0811  Gross per 24 hour  Intake   1810 ml  Output    915 ml  Net    895 ml   Filed Weights   01/24/14 0500 01/24/14 1128 01/25/14 0548  Weight: 163 lb 2.3 oz (74 kg) 161 lb 9.6 oz (73.3 kg) 161 lb 6 oz (73.2 kg)    PHYSICAL EXAM  General: Pleasant, NAD. Neuro: Alert and oriented X 3. Moves all extremities spontaneously. Psych: Normal affect. HEENT:  Normal  Neck: Supple without bruits or JVD. Lungs:  Resp regular and unlabored, diminished breath sounds bilat. Heart: IR, IR, no s3, s4, or  murmurs. Abdomen: Soft, non-tender, non-distended, BS + x 4.  Extremities: No clubbing, cyanosis or edema. DP/PT/Radials 2+ and equal bilaterally.  Accessory Clinical Findings  CBC  Recent Labs  01/24/14 0340 01/25/14 0330  WBC 1.9* 2.4*  NEUTROABS  --  0.9*  HGB 9.5* 9.7*  HCT 28.8* 30.2*  MCV 91.4 91.5  PLT 124* 371*   Basic Metabolic Panel  Recent Labs  01/24/14 0340 01/25/14 0330  NA 140 141  K 4.7 4.3  CL 103 103  CO2 26 25  GLUCOSE 91 97  BUN 38* 38*  CREATININE 1.60* 1.69*  CALCIUM 8.1* 8.4   Liver Function Tests  Recent Labs  01/24/14 0340  AST 16  ALT 12  ALKPHOS 63  BILITOT 0.7  PROT 5.3*  ALBUMIN 2.8*   Cardiac Enzymes  Recent Labs  01/22/14 2100 01/23/14 0430 01/23/14 1021  TROPONINI <0.30 <0.30 <0.30   Thyroid Function Tests  Recent Labs  01/22/14 2104  TSH 2.320   Lab Results  Component Value Date   INR 2.48* 01/25/2014   INR 3.37* 01/24/2014   INR 3.65* 01/23/2014    TELE  Afib, 70's-80's, multiple asymptomatic <2 sec pauses.  Radiology/Studies  Dg Chest Port 1 View  01/22/2014   CLINICAL DATA:  History of atrial fibrillation and bladder malignancy now with dyspnea, possible pneumonia.  EXAM: PORTABLE CHEST - 1 VIEW  COMPARISON:  PA and lateral chest x-ray of July 25, 2013 and PET-CT study of November 11, 2013.  FINDINGS: The lungs are adequately inflated. The interstitial markings are increased. There are confluent alveolar densities confluent in the right mid and lower lung consistent with pneumonia. The cardiac silhouette is top-normal in size. The patient has undergone previous CABG.The pulmonary vascularity is engorged. A Port-A-Cath appliance is in place and stable.  IMPRESSION: 1. Confluent parenchymal density in the mid and lower right lung is consistent with the clinically suspected pneumonia. 2. Increased interstitial markings diffusely are consistent with CHF.   Electronically Signed   By: David  Martinique   On:  01/22/2014 18:18   ASSESSMENT AND PLAN  1.  Acute hypoxic respiratory failure:  Multifactorial in setting of PNA->afib RVR->syst/diast chf.  Abx per IM.  Pt is feeling much better.  Afebrile.  WBC chronically suppressed.  Volume looks good.  Wean off of O2.  2.  PNA:  ? Aspiration in setting of mis-swallowing a pill recently.  Seen by speech.  abx per IM.  3.  Acute on chronic combined syst/diast chf:  + 1800 for admission.  Wt stable @ 161 the past 2 days.  BUN stable, creat up slightly (1.6->1.69) CO2 stable.  He only takes lasix on a prn basis at home and at that, he only takes it a few times/year.  Will d/c lasix.  HR/BP stable.  Cont bb/digoxin.  4.  Afib RVR:  Permanent.  Rate up in setting of resp failure earlier in admission. Currently stable.  Cont bb/digoxin/coumadin.  INR Rx.  5.  Stage IV CKD:  Creat rising since admission.  As above, does not use lasix @ home, will d/c.  Signed, Murray Hodgkins NP   Attending note:  Patient seen and examined. Reviewed case with [name] NP. He continues with management for pneumonia, also acute on chronic combined heart failure. Weight has been stable, Lasix being held at this point, creatinine 1.69. Heart rate has been stable in atrial fibrillation.  Satira Sark, M.D., F.A.C.C.

## 2014-01-26 DIAGNOSIS — N183 Chronic kidney disease, stage 3 unspecified: Secondary | ICD-10-CM

## 2014-01-26 LAB — CBC
HCT: 33.6 % — ABNORMAL LOW (ref 39.0–52.0)
Hemoglobin: 10.6 g/dL — ABNORMAL LOW (ref 13.0–17.0)
MCH: 29 pg (ref 26.0–34.0)
MCHC: 31.5 g/dL (ref 30.0–36.0)
MCV: 92.1 fL (ref 78.0–100.0)
PLATELETS: 165 10*3/uL (ref 150–400)
RBC: 3.65 MIL/uL — ABNORMAL LOW (ref 4.22–5.81)
RDW: 15.5 % (ref 11.5–15.5)
WBC: 2.8 10*3/uL — ABNORMAL LOW (ref 4.0–10.5)

## 2014-01-26 LAB — BASIC METABOLIC PANEL
BUN: 33 mg/dL — ABNORMAL HIGH (ref 6–23)
CALCIUM: 8.5 mg/dL (ref 8.4–10.5)
CO2: 27 mEq/L (ref 19–32)
Chloride: 104 mEq/L (ref 96–112)
Creatinine, Ser: 1.56 mg/dL — ABNORMAL HIGH (ref 0.50–1.35)
GFR calc non Af Amer: 39 mL/min — ABNORMAL LOW (ref >90)
GFR, EST AFRICAN AMERICAN: 46 mL/min — AB (ref >90)
Glucose, Bld: 109 mg/dL — ABNORMAL HIGH (ref 70–99)
Potassium: 4.7 mEq/L (ref 3.7–5.3)
Sodium: 142 mEq/L (ref 137–147)

## 2014-01-26 LAB — PROTIME-INR
INR: 1.83 — AB (ref 0.00–1.49)
PROTHROMBIN TIME: 20.6 s — AB (ref 11.6–15.2)

## 2014-01-26 LAB — VANCOMYCIN, TROUGH: Vancomycin Tr: 17 ug/mL (ref 10.0–20.0)

## 2014-01-26 LAB — CULTURE, RESPIRATORY W GRAM STAIN

## 2014-01-26 LAB — CULTURE, RESPIRATORY

## 2014-01-26 MED ORDER — FUROSEMIDE 20 MG PO TABS
20.0000 mg | ORAL_TABLET | Freq: Every day | ORAL | Status: DC
  Administered 2014-01-26: 20 mg via ORAL
  Filled 2014-01-26 (×2): qty 1

## 2014-01-26 MED ORDER — POLYETHYLENE GLYCOL 3350 17 G PO PACK
17.0000 g | PACK | Freq: Every day | ORAL | Status: DC
  Administered 2014-01-26: 17 g via ORAL
  Filled 2014-01-26 (×2): qty 1

## 2014-01-26 MED ORDER — WARFARIN SODIUM 4 MG PO TABS
4.0000 mg | ORAL_TABLET | Freq: Once | ORAL | Status: AC
  Administered 2014-01-26: 4 mg via ORAL
  Filled 2014-01-26: qty 1

## 2014-01-26 NOTE — Progress Notes (Signed)
ANTIBIOTIC CONSULT NOTE - FOLLOW UP  Pharmacy Consult for vancomycin Indication: pneumonia  Labs:  Recent Labs  01/23/14 0440 01/24/14 0340 01/25/14 0330  WBC 2.3* 1.9* 2.4*  HGB 9.8* 9.5* 9.7*  PLT 145* 124* 137*  CREATININE 1.61* 1.60* 1.69*   Estimated Creatinine Clearance: 34.3 ml/min (by C-G formula based on Cr of 1.69).  Recent Labs  01/25/14 2130  Taylor 17.0     Microbiology: Recent Results (from the past 720 hour(s))  CULTURE, BLOOD (ROUTINE X 2)     Status: None   Collection Time    01/22/14  7:21 PM      Result Value Range Status   Specimen Description BLOOD RIGHT ARM   Final   Special Requests BOTTLES DRAWN AEROBIC ONLY 5CC   Final   Culture  Setup Time     Final   Value: 01/23/2014 00:59     Performed at [facility]   Culture     Final   Value:        BLOOD CULTURE RECEIVED NO GROWTH TO DATE CULTURE WILL BE HELD FOR 5 DAYS BEFORE ISSUING A FINAL NEGATIVE REPORT     Performed at [facility]   Report Status PENDING   Incomplete  CULTURE, BLOOD (ROUTINE X 2)     Status: None   Collection Time    01/22/14  7:30 PM      Result Value Range Status   Specimen Description BLOOD RIGHT HAND   Final   Special Requests BOTTLES DRAWN AEROBIC ONLY 10CC   Final   Culture  Setup Time     Final   Value: 01/23/2014 00:59     Performed at [facility]   Culture     Final   Value:        BLOOD CULTURE RECEIVED NO GROWTH TO DATE CULTURE WILL BE HELD FOR 5 DAYS BEFORE ISSUING A FINAL NEGATIVE REPORT     Performed at [facility]   Report Status PENDING   Incomplete  MRSA PCR SCREENING     Status: None   Collection Time    01/22/14  9:53 PM      Result Value Range Status   MRSA by PCR NEGATIVE  NEGATIVE Final   Comment:            The GeneXpert MRSA Assay (FDA     approved for NASAL specimens     only), is one component of a     comprehensive MRSA colonization     surveillance program. It is not     intended to diagnose MRSA      infection nor to guide or     monitor treatment for     MRSA infections.  URINE CULTURE     Status: None   Collection Time    01/22/14 10:15 PM      Result Value Range Status   Specimen Description URINE, RANDOM   Final   Special Requests NONE   Final   Culture  Setup Time     Final   Value: 01/23/2014 00:03     Performed at [facility] Count     Final   Value: 30,000 COLONIES/ML     Performed at [facility]   Culture     Final   Value: Multiple bacterial morphotypes present, none predominant. Suggest appropriate recollection if clinically indicated.     Performed at [facility]   Report  Status 01/23/2014 FINAL   Final  RESPIRATORY VIRUS PANEL     Status: None   Collection Time    01/23/14 12:38 AM      Result Value Range Status   Source - RVPAN NASOPHARYNGEAL   Final   Respiratory Syncytial Virus A NOT DETECTED   Final   Respiratory Syncytial Virus B NOT DETECTED   Final   Influenza A NOT DETECTED   Final   Influenza B NOT DETECTED   Final   Parainfluenza 1 NOT DETECTED   Final   Parainfluenza 2 NOT DETECTED   Final   Parainfluenza 3 NOT DETECTED   Final   Metapneumovirus NOT DETECTED   Final   Rhinovirus NOT DETECTED   Final   Adenovirus NOT DETECTED   Final   Influenza A H1 NOT DETECTED   Final   Influenza A H3 NOT DETECTED   Final   Comment: (NOTE)           Normal Reference Range for each Analyte: NOT DETECTED     Testing performed using the Luminex xTAG Respiratory Viral Panel test     kit.     This test was developed and its performance characteristics determined     by [facility]. It has not been cleared or approved by the [place]     Food and Drug Administration. This test is used for clinical purposes.     It should not be regarded as investigational or for research. This     laboratory is certified under the [place] (CLIA) as qualified to perform high complexity      clinical laboratory testing.     Performed at [facility], EXPECTORATED SPUTUM-ASSESSMENT     Status: None   Collection Time    01/24/14  9:00 AM      Result Value Range Status   Specimen Description SPUTUM   Final   Special Requests NONE   Final   Sputum evaluation     Final   Value: THIS SPECIMEN IS ACCEPTABLE. RESPIRATORY CULTURE REPORT TO FOLLOW.   Report Status 01/24/2014 FINAL   Final  CULTURE, RESPIRATORY (NON-EXPECTORATED)     Status: None   Collection Time    01/24/14  9:00 AM      Result Value Range Status   Specimen Description SPUTUM   Final   Special Requests NONE   Final   Gram Stain     Final   Value: FEW WBC PRESENT,BOTH PMN AND MONONUCLEAR     RARE SQUAMOUS EPITHELIAL CELLS PRESENT     RARE GRAM POSITIVE COCCI IN CHAINS     RARE GRAM NEGATIVE RODS     Performed at [facility]   Culture     Final   Value: Culture reincubated for better growth     Performed at [facility]   Report Status PENDING   Incomplete     Assessment/Plan: 80yo male therapeutic on vancomycin with initial dosing for PNA.  Will continue for now and monitor.   Wynona Neat, PharmD, BCPS  01/26/2014,12:46 AM

## 2014-01-26 NOTE — Progress Notes (Signed)
Patient Name: Gabriel Estrada Date of Encounter: 01/26/2014   Principal Problem:   Acute respiratory failure with hypoxia Active Problems:   HCAP (healthcare-associated pneumonia)   Acute on chronic combined systolic and diastolic congestive heart failure, NYHA class 3   Atrial fibrillation with RVR   CKD (chronic kidney disease), stage III   Cardiomyopathy, ischemic   Mixed hyperlipidemia   Long term current use of anticoagulant   DLBCL (diffuse large B cell lymphoma)   SUBJECTIVE  No chest pain or sob currently.  Yesterday O2 was weaned down and this resulted in hypoxia and thus O2 was turned back up via [place].  CURRENT MEDS . azithromycin  500 mg Oral Daily  . carvedilol  6.25 mg Oral BID WC  . ceFEPime (MAXIPIME) IV  2 g Intravenous Q24H  . digoxin  0.125 mg Oral Daily  . docusate sodium  100 mg Oral BID  . ezetimibe-simvastatin  1 tablet Oral QHS  . levalbuterol  1.25 mg Nebulization TID  . vancomycin  1,000 mg Intravenous Q24H  . warfarin  4 mg Oral ONCE-1800  . Warfarin - Pharmacist Dosing Inpatient   Does not apply q1800   OBJECTIVE  Filed Vitals:   01/25/14 2151 01/26/14 0532 01/26/14 0946 01/26/14 1124  BP:  114/63  120/86  Pulse:  104  85  Temp:  97 F (36.1 C)    TempSrc:  Oral    Resp:  18    Height:      Weight:  161 lb 3.2 oz (73.12 kg)    SpO2: 95% 98% 98%     Intake/Output Summary (Last 24 hours) at 01/26/14 1201 Last data filed at 01/26/14 1100  Gross per 24 hour  Intake    720 ml  Output   1320 ml  Net   -600 ml   Filed Weights   01/24/14 1128 01/25/14 0548 01/26/14 0532  Weight: 161 lb 9.6 oz (73.3 kg) 161 lb 6 oz (73.2 kg) 161 lb 3.2 oz (73.12 kg)   PHYSICAL EXAM  General: Pleasant, NAD. Neuro: Alert and oriented X 3. Moves all extremities spontaneously. Psych: Normal affect. HEENT:  Normal  Neck: Supple without bruits.  JVP, which was relatively flat yesterday is ~ 12. Lungs:  Resp regular and unlabored, coarse breath sounds with  bibasilar crackles. Heart: IR, IR no s3, s4, or murmurs. Abdomen: Soft, non-tender, non-distended, BS + x 4.  Extremities: No clubbing, cyanosis or edema. DP/PT/Radials 2+ and equal bilaterally.  Accessory Clinical Findings  CBC  Recent Labs  01/25/14 0330 01/26/14 0500  WBC 2.4* 2.8*  NEUTROABS 0.9*  --   HGB 9.7* 10.6*  HCT 30.2* 33.6*  MCV 91.5 92.1  PLT 137* 932   Basic Metabolic Panel  Recent Labs  01/25/14 0330 01/26/14 0500  NA 141 142  K 4.3 4.7  CL 103 104  CO2 25 27  GLUCOSE 97 109*  BUN 38* 33*  CREATININE 1.69* 1.56*  CALCIUM 8.4 8.5   Liver Function Tests  Recent Labs  01/24/14 0340  AST 16  ALT 12  ALKPHOS 63  BILITOT 0.7  PROT 5.3*  ALBUMIN 2.8*   Lab Results  Component Value Date   INR 1.83* 01/26/2014   INR 2.48* 01/25/2014   INR 3.37* 01/24/2014    TELE  Afib, pvc's, 60's to 80's.  ASSESSMENT AND PLAN  1. Acute hypoxic respiratory failure: Multifactorial in setting of PNA->afib RVR->syst/diast chf. Abx per IM. Pt is feeling much better. Afebrile. WBC  chronically suppressed.  Lasix d/c'd yesterday in the setting of renal insuff.  Neck veins elevated this AM with bibasilar crackles. Will resume lasix @ 20mg  daily.  2. PNA: Seen by speech. abx per IM. Still requiring O2.  3. Acute on chronic combined syst/diast chf: + 1200 for admission. Wt stable @ 161 the past 3 days but appears to have mild volume overload.  BUN/Creat better this AM.  We d/c'd lasix yesterday.  He only takes lasix on a prn basis at home - a few times/year. Will resume lasix @ 20mg  daily.  HR/BP stable. Cont bb/digoxin.   4. Afib RVR: Permanent. Rate up in setting of resp failure earlier in admission. Currently stable, 80's to 90's for the most part, though he has periods of low 100's.  Cont bb/digoxin/coumadin. INR subRx this AM.  5. Stage IV CKD: Creat improved this AM after holding lasix yesterday.  Resuming lasix 20mg  daily today - f/u creat in  AM.  Signed, Murray Hodgkins NP   Attending note:  Patient seen and examined. Reviewed and agree with the above noted by [name] NP.  Clinically improved, still undergoing management of pneumonia and concurrent acute on chronic combined heart failure. Plan is to initiate Lasix at 20 mg daily to try and further optimize volume status in hopes that renal function will tolerate. Creatinine is better today after holding Lasix. Heart rate is currently stable in atrial fibrillation. Otherwise continue current regimen.  Satira Sark, M.D., F.A.C.C.

## 2014-01-26 NOTE — Progress Notes (Signed)
ANTICOAGULATION CONSULT NOTE - Follow-up Consult  Pharmacy Consult for warfarin Indication: atrial fibrillation  No Known Allergies  Patient Measurements: Height: 6' (182.9 cm) Weight: 161 lb 3.2 oz (73.12 kg) (scale b) IBW/kg (Calculated) : 77.6   Vital Signs: Temp: 97 F (36.1 C) (02/01 0532) Temp src: Oral (02/01 0532) BP: 114/63 mmHg (02/01 0532) Pulse Rate: 104 (02/01 0532)  Labs:  Recent Labs  01/23/14 1021  01/24/14 0340 01/25/14 0330 01/26/14 0500  HGB  --   < > 9.5* 9.7* 10.6*  HCT  --   --  28.8* 30.2* 33.6*  PLT  --   --  124* 137* 165  LABPROT  --   < > 32.9* 26.0* 20.6*  INR  --   < > 3.37* 2.48* 1.83*  CREATININE  --   --  1.60* 1.69* 1.56*  TROPONINI <0.30  --   --   --   --   < > = values in this interval not displayed.  Estimated Creatinine Clearance: 37.1 ml/min (by C-G formula based on Cr of 1.56).  Assessment: 80 year old man on warfarin prior to admission for AFib.  Patient's INR was supratherapeutic on admission (INR= 3.43) but has been trending down. No bleeding issues noted.   INR today is now subtherapeutic at 1.83. (Home dose 5mg  daily except for 2.5mg  on Sun and Thur.) This decrease in INR is likely the effects of the held doses from three and four days ago. Now that patient has been restarted on coumadin, would expect INR to begin trending up.    Hgb is 10.6, and pltc is 165. No bleeding issues noted.   Goal of Therapy:  INR 2-3   Plan:  Coumadin 4 mg PO x 1 dose tonight Daily INR  Kayne Yuhas C. Jamiyah Dingley, PharmD Clinical Pharmacist-Resident Pager: [phone] Pharmacy: [phone] 01/26/2014 7:19 AM

## 2014-01-26 NOTE — Progress Notes (Signed)
Pt. Called nurses station and I was advised that pt. Was having Shortness of breath, I checked pt.'s O2 Sats and he was 88-89% on 2L, Pt. Remained at about 89-90% on 4L even after I had him take slow deep breaths, I put client on 5 L of O2 and he went up to 95-96%. I notified MD on call who reordered pt.'s Lasix b/c pt. Had refused his previous Lasix doses. Pt. Became stable again and denied any chest pain or discomfort.

## 2014-01-26 NOTE — Progress Notes (Signed)
Gabriel Estrada YYF:[id number] DOB: Dec 07, 1928 DOA: 01/22/2014 PCP: Walker Kehr, MD   Assessment/Plan:  Acute respiratory failure with hypoxia:   A) HCAP (healthcare-associated pneumonia)   B) Acute Systolic heart exacerbation -predominant right side so need to exclude aspiration- SLP consulted for eval: no dysphagia and rec regular diet -cont current abx's X 1 more day -completed chemotherapy at the end of October and is apparently on radiation therapy.  (patient with pancytopenia) -cont supportive care -Flu PCR neg/resp viral panel negative as well -urinary strep and legionella negative -will switch to augmentin in am.    Atrial fibrillation with RVR -rate controlled -continue beta blocker, digoxin, Coumadin per pharmacy (INR therapeutic) -TSH normal    CKD (chronic kidney disease), stage III -Creatinine slightly elevated. Next line-Lasix will be discontinue at this point is that is no signs of fluid overload. -Cr 1.59, essentially at baseline    Chronic systolic congestive heart failure, NYHA class 3 -FU echo this admit shows progression 35-40% with severe hypokinesis of entire inferolateral and inferior myocardium-this has progressed since previous echo: last echo with EF in 2013 was 45-50% and NO RWMA-echo July 2014 ? Septal and apical hypokinesis-MUGA June 2014 with EF 27% -unclear if lower EF from asymptomatic RVR or due to ischemia esp in setting of RWMA and known CAD -? Chemotherapy induce CM?- received Rituxan/Cytoxan/Etoposide/Vincristine from 08/30/13 to 10/16/13 -Lasix per Cards -add ACE I when BP can support and renal function controlled/stable -also found with pulmonary HTN 65 mmHg -will follow cardiology rec's    Mixed hyperlipidemia -Continue statins    Long term current use of anticoagulant -INR therapeutic -Pharmacy to dose/manage    Pancytopenia  -Appears to be secondary to recent chemotherapy and large B cell lymphoma -will monitor cell count  trend -platelets now WNL and WBC's going up.    DLBCL (diffuse large B cell lymphoma) -last chemo as above and currently on rad tx -continue outpatient follow up with oncology    DVT prophylaxis: Warfarin  Code Status: Full Family Communication: son at bedside Disposition Plan/Expected LOS: home when medically stable. Will arrange home oxygen   Consultants: Cardiology  Procedures: 2-D echocardiogram - Left ventricle: The cavity size was mildly dilated. Wall thickness was normal. Systolic function was moderately reduced. The estimated ejection fraction was in the range of 35% to 40%. Severe hypokinesis of the entireinferolateral and inferior myocardium. - Mitral valve: Mild regurgitation. - Right ventricle: Systolic function was mildly reduced. - Right atrium: The atrium was mildly dilated. - Pulmonary arteries: Systolic pressure was moderately increased. PA peak pressure: 71m Hg (S).   Antibiotics: Zithromax 1/29 >>> Cefepime 1/28 >>> Vancomycin 1/28 >>>  HPI/Subjective: Pt breathing ok and feeling better. No fever. Denies any chest pain or palpitations. Has failed weaning oxygen off.  Objective: Blood pressure 107/86, pulse 80, temperature 97.3 F (36.3 C), temperature source Oral, resp. rate 20, height 6' (1.829 m), weight 73.12 kg (161 lb 3.2 oz), SpO2 100.00%.  Intake/Output Summary (Last 24 hours) at 01/26/14 1603 Last data filed at 01/26/14 1412  Gross per 24 hour  Intake    720 ml  Output   1250 ml  Net   -530 ml     Exam: General: afebrile, breathing ok with oxygen supplementations.  Lungs: Breathing comfortable on 2 L nasal cannula with good oxygen saturation; no wheezing, decreased breath sounds at bases, mild crackles Cardiovascular: Irregular rate and rhythm without murmur gallop or rub normal S1 and S2, no peripheral edema or  JVD Abdomen: Nontender, nondistended, soft, bowel sounds positive, no rebound, no ascites, no appreciable  mass Musculoskeletal: No significant cyanosis, clubbing or joint swelling Neurological: Alert and oriented x 3, moves all extremities x 4 without focal neurological deficits, CN 2-12 intact  Scheduled Meds:  Scheduled Meds: . azithromycin  500 mg Oral Daily  . carvedilol  6.25 mg Oral BID WC  . ceFEPime (MAXIPIME) IV  2 g Intravenous Q24H  . digoxin  0.125 mg Oral Daily  . docusate sodium  100 mg Oral BID  . ezetimibe-simvastatin  1 tablet Oral QHS  . furosemide  20 mg Oral Daily  . levalbuterol  1.25 mg Nebulization TID  . polyethylene glycol  17 g Oral Daily  . vancomycin  1,000 mg Intravenous Q24H  . warfarin  4 mg Oral ONCE-1800  . Warfarin - Pharmacist Dosing Inpatient   Does not apply q1800   Continuous Infusions: . sodium chloride 20 mL/hr at 01/24/14 2057    Data Reviewed: Basic Metabolic Panel:  Recent Labs Lab 01/22/14 1816 01/23/14 0440 01/24/14 0340 01/25/14 0330 01/26/14 0500  NA 142 140 140 141 142  K 4.9 4.9 4.7 4.3 4.7  CL 101 104 103 103 104  CO2 _0 GLUCOSE 154* 120* 91 97 109*  BUN 35* 36* 38* 38* 33*  CREATININE 1.53* 1.61* 1.60* 1.69* 1.56*  CALCIUM 8.9 8.2* 8.1* 8.4 8.5   Liver Function Tests:  Recent Labs Lab 01/24/14 0340  AST 16  ALT 12  ALKPHOS 63  BILITOT 0.7  PROT 5.3*  ALBUMIN 2.8*   CBC:  Recent Labs Lab 01/22/14 1816 01/23/14 0440 01/24/14 0340 01/25/14 0330 01/26/14 0500  WBC 2.4* 2.3* 1.9* 2.4* 2.8*  NEUTROABS  --   --   --  0.9*  --   HGB 13.6 9.8* 9.5* 9.7* 10.6*  HCT 41.8 30.6* 28.8* 30.2* 33.6*  MCV 92.1 91.3 91.4 91.5 92.1  PLT 184 145* 124* 137* 165   Cardiac Enzymes:  Recent Labs Lab 01/22/14 2100 01/23/14 0430 01/23/14 1021  TROPONINI <0.30 <0.30 <0.30   BNP (last 3 results)  Recent Labs  07/22/13 1250 01/22/14 1816 01/24/14 0340  PROBNP 11199.0* 4885.0* 5238.0*   CBG:  Recent Labs Lab 01/23/14 0031  GLUCAP 98    Recent Results (from the past 240 hour(s))  CULTURE,  BLOOD (ROUTINE X 2)     Status: None   Collection Time    01/22/14  7:21 PM      Result Value Range Status   Specimen Description BLOOD RIGHT ARM   Final   Special Requests BOTTLES DRAWN AEROBIC ONLY 5CC   Final   Culture  Setup Time     Final   Value: 01/23/2014 00:59     Performed at [facility]   Culture     Final   Value:        BLOOD CULTURE RECEIVED NO GROWTH TO DATE CULTURE WILL BE HELD FOR 5 DAYS BEFORE ISSUING A FINAL NEGATIVE REPORT     Performed at [facility]   Report Status PENDING   Incomplete  CULTURE, BLOOD (ROUTINE X 2)     Status: None   Collection Time    01/22/14  7:30 PM      Result Value Range Status   Specimen Description BLOOD RIGHT HAND   Final   Special Requests BOTTLES DRAWN AEROBIC ONLY 10CC   Final   Culture  Setup Time  Final   Value: 01/23/2014 00:59     Performed at [facility]   Culture     Final   Value:        BLOOD CULTURE RECEIVED NO GROWTH TO DATE CULTURE WILL BE HELD FOR 5 DAYS BEFORE ISSUING A FINAL NEGATIVE REPORT     Performed at [facility]   Report Status PENDING   Incomplete  MRSA PCR SCREENING     Status: None   Collection Time    01/22/14  9:53 PM      Result Value Range Status   MRSA by PCR NEGATIVE  NEGATIVE Final   Comment:            The GeneXpert MRSA Assay (FDA     approved for NASAL specimens     only), is one component of a     comprehensive MRSA colonization     surveillance program. It is not     intended to diagnose MRSA     infection nor to guide or     monitor treatment for     MRSA infections.  URINE CULTURE     Status: None   Collection Time    01/22/14 10:15 PM      Result Value Range Status   Specimen Description URINE, RANDOM   Final   Special Requests NONE   Final   Culture  Setup Time     Final   Value: 01/23/2014 00:03     Performed at [facility] Count     Final   Value: 30,000 COLONIES/ML     Performed at [facility]   Culture      Final   Value: Multiple bacterial morphotypes present, none predominant. Suggest appropriate recollection if clinically indicated.     Performed at [facility]   Report Status 01/23/2014 FINAL   Final  RESPIRATORY VIRUS PANEL     Status: None   Collection Time    01/23/14 12:38 AM      Result Value Range Status   Source - RVPAN NASOPHARYNGEAL   Final   Respiratory Syncytial Virus A NOT DETECTED   Final   Respiratory Syncytial Virus B NOT DETECTED   Final   Influenza A NOT DETECTED   Final   Influenza B NOT DETECTED   Final   Parainfluenza 1 NOT DETECTED   Final   Parainfluenza 2 NOT DETECTED   Final   Parainfluenza 3 NOT DETECTED   Final   Metapneumovirus NOT DETECTED   Final   Rhinovirus NOT DETECTED   Final   Adenovirus NOT DETECTED   Final   Influenza A H1 NOT DETECTED   Final   Influenza A H3 NOT DETECTED   Final   Comment: (NOTE)           Normal Reference Range for each Analyte: NOT DETECTED     Testing performed using the Luminex xTAG Respiratory Viral Panel test     kit.     This test was developed and its performance characteristics determined     by [facility]. It has not been cleared or approved by the [place]     Food and Drug Administration. This test is used for clinical purposes.     It should not be regarded as investigational or for research. This     laboratory is certified under the Clinical Laboratory Improvement     Amendments of 1988 (CLIA) as qualified  to perform high complexity     clinical laboratory testing.     Performed at [facility], EXPECTORATED SPUTUM-ASSESSMENT     Status: None   Collection Time    01/24/14  9:00 AM      Result Value Range Status   Specimen Description SPUTUM   Final   Special Requests NONE   Final   Sputum evaluation     Final   Value: THIS SPECIMEN IS ACCEPTABLE. RESPIRATORY CULTURE REPORT TO FOLLOW.   Report Status 01/24/2014 FINAL   Final  CULTURE, RESPIRATORY (NON-EXPECTORATED)     Status:  None   Collection Time    01/24/14  9:00 AM      Result Value Range Status   Specimen Description SPUTUM   Final   Special Requests NONE   Final   Gram Stain     Final   Value: FEW WBC PRESENT,BOTH PMN AND MONONUCLEAR     RARE SQUAMOUS EPITHELIAL CELLS PRESENT     RARE GRAM POSITIVE COCCI IN CHAINS     RARE GRAM NEGATIVE RODS     Performed at [facility]   Culture     Final   Value: FEW STREPTOCOCCUS,BETA HEMOLYTIC NOT GROUP A     Performed at [facility]   Report Status 01/26/2014 FINAL   Final    Time > 30 minutes   Natasha Burda  [phone]  01/26/2014, 4:03 PM   LOS: 4 days

## 2014-01-27 ENCOUNTER — Encounter (HOSPITAL_COMMUNITY): Payer: Self-pay | Admitting: Dentistry

## 2014-01-27 LAB — PROTIME-INR
INR: 1.7 — ABNORMAL HIGH (ref 0.00–1.49)
PROTHROMBIN TIME: 19.5 s — AB (ref 11.6–15.2)

## 2014-01-27 LAB — BASIC METABOLIC PANEL
BUN: 27 mg/dL — AB (ref 6–23)
CALCIUM: 8.8 mg/dL (ref 8.4–10.5)
CO2: 28 mEq/L (ref 19–32)
CREATININE: 1.38 mg/dL — AB (ref 0.50–1.35)
Chloride: 103 mEq/L (ref 96–112)
GFR calc non Af Amer: 46 mL/min — ABNORMAL LOW (ref >90)
GFR, EST AFRICAN AMERICAN: 53 mL/min — AB (ref >90)
Glucose, Bld: 102 mg/dL — ABNORMAL HIGH (ref 70–99)
Potassium: 4.5 mEq/L (ref 3.7–5.3)
Sodium: 143 mEq/L (ref 137–147)

## 2014-01-27 LAB — PATHOLOGIST SMEAR REVIEW

## 2014-01-27 MED ORDER — HEPARIN SOD (PORK) LOCK FLUSH 100 UNIT/ML IV SOLN
500.0000 [IU] | INTRAVENOUS | Status: AC | PRN
  Administered 2014-01-27: 500 [IU]

## 2014-01-27 MED ORDER — WARFARIN SODIUM 5 MG PO TABS
5.0000 mg | ORAL_TABLET | Freq: Once | ORAL | Status: DC
  Filled 2014-01-27: qty 1

## 2014-01-27 MED ORDER — LEVALBUTEROL TARTRATE 45 MCG/ACT IN AERO: 1.0000 | INHALATION_SPRAY | Freq: Four times a day (QID) | RESPIRATORY_TRACT | Status: DC | PRN

## 2014-01-27 MED ORDER — FUROSEMIDE 20 MG PO TABS
20.0000 mg | ORAL_TABLET | Freq: Every day | ORAL | Status: DC
Start: 2014-01-27 — End: 2014-05-26

## 2014-01-27 MED ORDER — AMOXICILLIN-POT CLAVULANATE 875-125 MG PO TABS: 1.0000 | ORAL_TABLET | Freq: Two times a day (BID) | ORAL | Status: AC

## 2014-01-27 NOTE — Progress Notes (Signed)
Subjective: No complaints.  Breathing better.  Objective: Vital signs in last 24 hours: Temp:  [97.3 F (36.3 C)-98 F (36.7 C)] 97.5 F (36.4 C) (02/02 0417) Pulse Rate:  [67-109] 85 (02/02 0417) Resp:  [18-20] 19 (02/02 0417) BP: (97-123)/(58-93) 97/65 mmHg (02/02 0417) SpO2:  [90 %-100 %] 90 % (02/02 0417) FiO2 (%):  [28 %] 28 % (02/01 2047) Weight:  [159 lb 13.3 oz (72.5 kg)] 159 lb 13.3 oz (72.5 kg) (02/02 0417) Last BM Date: 01/23/14  Intake/Output from previous day: 02/01 0701 - 02/02 0700 In: 1600 [P.O.:720; I.V.:880] Out: 900 [Urine:900] Intake/Output this shift: Total I/O In: 880 [I.V.:880] Out: 500 [Urine:500]  Medications Current Facility-Administered Medications  Medication Dose Route Frequency Provider Last Rate Last Dose  . 0.9 %  sodium chloride infusion   Intravenous Continuous Debbe Odea, MD 20 mL/hr at 01/26/14 2344    . azithromycin (ZITHROMAX) tablet 500 mg  500 mg Oral Daily Barton Dubois, MD   500 mg at 01/26/14 1126  . carvedilol (COREG) tablet 6.25 mg  6.25 mg Oral BID WC Samella Parr, NP   6.25 mg at 01/27/14 0557  . ceFEPIme (MAXIPIME) 2 g in dextrose 5 % 50 mL IVPB  2 g Intravenous Q24H Theodis Blaze, MD   2 g at 01/26/14 2219  . digoxin (LANOXIN) tablet 0.125 mg  0.125 mg Oral Daily Samella Parr, NP   0.125 mg at 01/26/14 1126  . docusate sodium (COLACE) capsule 100 mg  100 mg Oral BID Samella Parr, NP   100 mg at 01/26/14 1125  . ezetimibe-simvastatin (VYTORIN) 10-40 MG per tablet 1 tablet  1 tablet Oral QHS Samella Parr, NP   1 tablet at 01/26/14 2224  . furosemide (LASIX) tablet 20 mg  20 mg Oral Daily Rogelia Mire, NP   20 mg at 01/26/14 1536  . ipratropium (ATROVENT) nebulizer solution 0.5 mg  0.5 mg Nebulization Q4H PRN Theodis Blaze, MD   0.5 mg at 01/23/14 0002  . levalbuterol (XOPENEX) nebulizer solution 1.25 mg  1.25 mg Nebulization Q4H PRN Theodis Blaze, MD      . levalbuterol Penne Lash) nebulizer solution 1.25  mg  1.25 mg Nebulization TID Debbe Odea, MD   1.25 mg at 01/26/14 2047  . ondansetron (ZOFRAN) injection 4 mg  4 mg Intravenous Q6H PRN Theodis Blaze, MD      . oxyCODONE-acetaminophen (PERCOCET/ROXICET) 5-325 MG per tablet 1-2 tablet  1-2 tablet Oral Q3H PRN Theodis Blaze, MD      . polyethylene glycol (MIRALAX / GLYCOLAX) packet 17 g  17 g Oral Daily Barton Dubois, MD   17 g at 01/26/14 1829  . sodium chloride 0.9 % injection 10-40 mL  10-40 mL Intracatheter PRN Barton Dubois, MD   10 mL at 01/25/14 2310  . vancomycin (VANCOCIN) IVPB 1000 mg/200 mL premix  1,000 mg Intravenous Q24H Theodis Blaze, MD   1,000 mg at 01/26/14 2343  . Warfarin - Pharmacist Dosing Inpatient   Does not apply [place], [facility]       Facility-Administered Medications Ordered in Other Encounters  Medication Dose Route Frequency Provider Last Rate Last Dose  . sodium chloride 0.9 % injection 10 mL  10 mL Intracatheter PRN Heath Lark, MD   10 mL at 10/16/13 1550    PE: General appearance: alert, cooperative and no distress Neck: no JVD Lungs: Mild rhonchi on the right Heart: irregularly irregular  rhythm Pulses: 2+ and symmetric Skin: Warm and dry Neurologic: Grossly normal  Lab Results:   Recent Labs  01/25/14 0330 01/26/14 0500  WBC 2.4* 2.8*  HGB 9.7* 10.6*  HCT 30.2* 33.6*  PLT 137* 165   BMET  Recent Labs  01/25/14 0330 01/26/14 0500  NA 141 142  K 4.3 4.7  CL 103 104  CO2 25 27  GLUCOSE 97 109*  BUN 38* 33*  CREATININE 1.69* 1.56*  CALCIUM 8.4 8.5   PT/INR  Recent Labs  01/25/14 0330 01/26/14 0500  LABPROT 26.0* 20.6*  INR 2.48* 1.83*    Assessment/Plan    Principal Problem:   Acute respiratory failure with hypoxia Active Problems:   Atrial fibrillation with RVR   CKD (chronic kidney disease), stage III   Mixed hyperlipidemia   Long term current use of anticoagulant   DLBCL (diffuse large B cell lymphoma)   HCAP (healthcare-associated pneumonia)    Cardiomyopathy, ischemic   Acute on chronic combined systolic and diastolic congestive heart failure, NYHA class 3  Plan:  Afib controlled for the most in the 80's but still with some mild tachycardia. Coreg, digoxin.  INR subtherapeutic. Per pharmacy.  BP stable but will not likely support ans ACE at his time.  EF 35-40%-Net fluids: +0.7L/+1.9L.   Back on  Lasix 20mg  daily. Azithromycin/cefepime/Vanc for PNA.  Labs pending.    Jenkins Rouge

## 2014-01-27 NOTE — Discharge Summary (Signed)
Physician Discharge Summary  Gabriel Estrada VQX:[id number] DOB: 14-Sep-1928 DOA: 01/22/2014  PCP: Walker Kehr, MD  Admit date: 01/22/2014 Discharge date: 01/27/2014  Time spent: >30 minutes  Recommendations for Outpatient Follow-up:  1. CBC to follow cell line trend 2. BMET to follow electrolytes and renal function 3. Once renal function and BP stable please start low dose ACE/ARB for his reduce EF 4. CXR in 4 weeks to evaluate resolution of infiltrates and effusion  Discharge Diagnoses:  Principal Problem:   Acute respiratory failure with hypoxia Active Problems:   Atrial fibrillation with RVR   CKD (chronic kidney disease), stage III   Mixed hyperlipidemia   Long term current use of anticoagulant   DLBCL (diffuse large B cell lymphoma)   HCAP (healthcare-associated pneumonia)   Cardiomyopathy, ischemic   Acute on chronic combined systolic and diastolic congestive heart failure, NYHA class 3   Discharge Condition: stable and improved. Will discharge home with home health services.  Diet recommendation: low sodium diet  Filed Weights   01/25/14 0548 01/26/14 0532 01/27/14 0417  Weight: 73.2 kg (161 lb 6 oz) 73.12 kg (161 lb 3.2 oz) 72.5 kg (159 lb 13.3 oz)    History of present illness:  80 yo male with history of atrial fibrillation on coumadin, systolic CHF, diffuse B cell lymphoma under Dr. Alvy Bimler care and Dr. Lisbeth Renshaw (radiation oncologist), CKD stage III, who presents to [facility] ED with main concern of 2 weeks duration of progressively worsening shortness of breath, cough productive of yellow sputum, fevers, chills, malaise, poor oral intake. He was brought in by ambulance. Pt explains his dyspnea is worse with exertion and somewhat better with resting but no specific alleviating factors, no chest pian, no abdominal or urinary concerns. Pt denies sick contacts or exposures. In ED, pt found to be hypoxic with oxygen saturation in 80's, CXR findings worrisome for PNA. Pt also noted to  be in atrial fibrillation and was started on cardizem drip.    Hospital Course:  Acute respiratory failure with hypoxia:  A) HCAP (healthcare-associated pneumonia)  B) Acute Systolic heart exacerbation  -predominant right side so needed to exclude aspiration- SLP consulted for eval: recommends dysphagia 3 diet and thin liquids; most likely due to deconditioning   -no fever, no elevated WBC's (caviat as patient with pancytopenia); but overall feeling and breathing better. Will start augmentin for 3 more days to finish therapy -repeat CXR in 4 weeks to follow infiltrates resolution -will discharge on oxygen supplementation and PRN xopenex  -Flu PCR neg/resp viral panel negative as well  -urinary strep and legionella negative  -started on lasix daily 22m -advise to follow low sodium diet and daily weight.  Atrial fibrillation with RVR  -rate controlled  -continue beta blocker, digoxin and Coumadin -follow up with cardiology in 1 week  -TSH normal   CKD (chronic kidney disease), stage III  -Creatinine slightly elevated on admission -Cr 1.38, essentially at baseline  -follow up as an outpatient -will benefit of close BMET in outpatient setting to follow renal function and electrolytes  Chronic systolic congestive heart failure, NYHA class 3  -FU echo this admit shows progression 35-40% with severe hypokinesis of entire inferolateral and inferior myocardium -? Chemotherapy induce CM?- received Rituxan/Cytoxan/Etoposide/Vincristine from 08/30/13 to 10/16/13  -Lasix per Cardiology recommendations will benefit of 222mdaily for volume control -plan is to add ACE I when BP can support it and renal function is controlled/stable (to be determine by cardiology/PCP during follow up visit)  -also found with  pulmonary HTN 65 mmHg   Mixed hyperlipidemia  -Continue statins   Long term current use of anticoagulant  -INR slightly sub-therapeutic (1.7) -per pharmacy recommendations will resume home  dose and check coumadin level again at the end of this week  Pancytopenia  -Appears to be secondary to recent chemotherapy and large B cell lymphoma  -platelets now WNL and WBC's going up.  -CBC during follow up visit to check cell lines trend  Physical deconditioning -will arrange HHPT and HHRN  DLBCL (diffuse large B cell lymphoma)  -continue outpatient follow up with oncology and radiation-oncology  *Rest of medical problems remains stable and the plan is to continue current medication regimen.  Procedures: 2-D echocardiogram  - Left ventricle: The cavity size was mildly dilated. Wall thickness was normal. Systolic function was moderately reduced. The estimated ejection fraction was in the range of 35% to 40%. Severe hypokinesis of the entireinferolateral and inferior myocardium. - Mitral valve: Mild regurgitation. - Right ventricle: Systolic function was mildly reduced. - Right atrium: The atrium was mildly dilated. - Pulmonary arteries: Systolic pressure was moderately increased. PA peak pressure: 53m Hg (S).  Consultations:  Cardiology  Discharge Exam: Filed Vitals:   01/27/14 0417  BP: 97/65  Pulse: 85  Temp: 97.5 F (36.4 C)  Resp: 19    General: afebrile, breathing ok with oxygen supplementations.  Lungs: Breathing comfortable on 2-3 L nasal cannula with good oxygen saturation; no wheezing, improved air movement and no crackles  Cardiovascular: Irregular rate and rhythm without murmur gallop or rub normal S1 and S2, no peripheral edema or JVD  Abdomen: Nontender, nondistended, soft, bowel sounds positive, no rebound, no ascites, no appreciable mass  Musculoskeletal: No significant cyanosis, clubbing or joint swelling  Neurological: Alert and oriented x 3, moves all extremities x 4 without focal neurological deficits, CN 2-12 intact   Discharge Instructions  Discharge Orders   Future Appointments Provider Department Dept Phone   02/25/2014 3:45 PM JMinus Breeding MD [phone]  04/08/2014 1:30 PM ACassandria Anger MD [phone]  04/16/2014 9:00 AM [facility] [phone]  04/16/2014 9:30 AM NHeath Lark MD [facility] [phone]  Future Orders Complete By Expires   Diet - low sodium heart healthy  As directed    Discharge instructions  As directed    Comments:     Take medications as prescribed Arrange follow up with PCP in 7-10 days Arrange follow up with cardiology service in 1 week Follow a low sodium diet Dysphagia 3 diet Please have your coumadin level check on 01/30/13   Increase activity slowly  As directed        Medication List         amoxicillin-clavulanate 875-125 MG per tablet  Commonly known as:  AUGMENTIN  Take 1 tablet by mouth 2 (two) times daily.     carvedilol 6.25 MG tablet  Commonly known as:  COREG  Take 6.25 mg by mouth 2 (two) times daily with a meal.     cholecalciferol 1000 UNITS tablet  Commonly known as:  VITAMIN D  Take 1 tablet (1,000 Units total) by mouth daily.     digoxin 0.125 MG tablet  Commonly known as:  LANOXIN  Take 0.125 mg by mouth daily.     docusate sodium 100 MG capsule  Commonly known as:  COLACE  Take 100  mg by mouth 2 (two) times daily.     ezetimibe-simvastatin 10-40 MG per tablet  Commonly known as:  VYTORIN  Take 1 tablet by mouth at bedtime.     furosemide 20 MG tablet  Commonly known as:  LASIX  Take 1 tablet (20 mg total) by mouth daily.     levalbuterol 45 MCG/ACT inhaler  Commonly known as:  XOPENEX HFA  Inhale 1-2 puffs into the lungs every 6 (six) hours as needed for wheezing or shortness of breath.     multivitamin with minerals Tabs tablet  Take 1 tablet by mouth daily.     tamsulosin 0.4 MG Caps capsule  Commonly known as:  FLOMAX  Take 0.4 mg by mouth at bedtime.     warfarin 5 MG tablet   Commonly known as:  COUMADIN  Take 2.5-5 mg by mouth daily at 6 PM. 65m/day except take 2.565mon Thursday and Sunday       No Known Allergies     Follow-up Information   Follow up with AdDavidson(RN/PT)    Contact information:   [address] [place] [phone]     Follow up with AlWalker KehrMD. Schedule an appointment as soon as possible for a visit in 10 days.   Specialty:  Internal Medicine   Contact information:   [address] [phone]     Follow up with JaMinus BreedingMD. Schedule an appointment as soon as possible for a visit in 1 week.   Specialty:  Cardiology   Contact information:   [id number] [place] [phone]      The results of significant diagnostics from this hospitalization (including imaging, microbiology, ancillary and laboratory) are listed below for reference.    Significant Diagnostic Studies: Dg Chest Port 1 View  01/22/2014   CLINICAL DATA:  History of atrial fibrillation and bladder malignancy now with dyspnea, possible pneumonia.  EXAM: PORTABLE CHEST - 1 VIEW  COMPARISON:  PA and lateral chest x-ray of July 25, 2013 and PET-CT study of November 11, 2013.  FINDINGS: The lungs are adequately inflated. The interstitial markings are increased. There are confluent alveolar densities confluent in the right mid and lower lung consistent with pneumonia. The cardiac silhouette is top-normal in size. The patient has undergone previous CABG.The pulmonary vascularity is engorged. A Port-A-Cath appliance is in place and stable.  IMPRESSION: 1. Confluent parenchymal density in the mid and lower right lung is consistent with the clinically suspected pneumonia. 2. Increased interstitial markings diffusely are consistent with CHF.   Electronically Signed   By: David  JoMartinique On: 01/22/2014 18:18   Dg  Swallowing Func-speech Pathology  01/27/2014   BoKatherene Pontoeblois, CCC-SLP     01/27/2014 10:41 AM Objective Swallowing Evaluation: Modified Barium Swallowing Study   Patient Details  Name: Gabriel ManoRN: [id number] of Birth: [birth year]Today's Date: 01/23/2014 Time: 1350-1415 SLP Time Calculation (min): 25 min  Past Medical History:  Past Medical History  Diagnosis Date  . Coronary artery disease     a. 02/2001 MI/Cath/CABG x 5: LIMA->LAD, VG->D1, VG->OM2,  VG->PDA->LPL & MV Ring (#30 Seguin ring annuloplasty)  . PVD (peripheral vascular disease)   . Hyperlipidemia   . Mitral regurgitation     a. 02/2001 s/p #30 Seguin ring annuloplasty.  . Chronic  systolic CHF (congestive heart failure)     a. 05/2013 Echo: appears to be signif overall LV dysfxn, cannot  estimate EF.  Ao sclerosis;  b. 06/2013 Echo: Poor windows, ?  septal and apical HK, mild LVH, EF likely low normal  . Hypertension   . CKD (chronic kidney disease), stage III   . Edentulous     all remaining teeth pulled-05-30-13  . A-fib     a. chronic-Dr. Hochrein,cardiology;  b. on coumadin.  [name] Lymphoma     Nasal Mass-left-Dr. Ha,oncology  . Bladder mass 07-03-13  . Carotid artery disease     a. 09/2012 U/S: 0-39% bilat ICA stenosis.  . Hemoptysis 12/25/2013   Past Surgical History:  Past Surgical History  Procedure Laterality Date  . Coronary artery bypass graft  03/15/01    x 5  . Multiple extractions with alveoloplasty N/A 05/30/2013    Procedure: Extraction of tooth #'s  2,6,7,8,10,11,12,18,20,21,22,23,24,25,26,27,28,29 with  alveoloplasty and mandibular tori reductions, and lateral  exostoses reductions.;  Surgeon: Lenn Cal, DDS;   Location: [facility];  Service: Oral Surgery;  Laterality: N/A;  . Soft tissue biopsy       Diffuse B-Cell Lynphoma/ Nasal mucosa, biopsy, left,left nasal  mass  . Portacath placement Right     right chest  . Cystoscopy w/ retrogrades Bilateral 07/08/2013    Procedure: CYSTOSCOPY WITH RETROGRADE PYELOGRAM;  Surgeon: Dutch Gray, MD;  Location: [facility];  Service: Urology;  Laterality:  Bilateral;  GENERAL ANESTHESIA WITH PARALYSIS, EXAM UNDER  ANESTHESIA       HPI:  Pt is 80 yo male with history of atrial fibrillation on coumadin,  systolic CHF, diffuse B cell lymphoma under Dr. Alvy Bimler care and  Dr. Lisbeth Renshaw (radiation oncologist), CKD stage III, who presents to  [facility] ED with main concern of 2 weeks duration of progressively  worsening shortness of breath, cough productive of yellow sputum,  fevers, chills, malaise, poor oral intake. He was brought in by  ambulance. Pt explains his dyspnea is worse with exertion and  somewhat better with resting but no specific alleviating factors,  no chest pian, no abdominal or urinary concerns. Pt denies sick  contacts or exposures. In ED, pt found to be hypoxic with oxygen  saturation in 80's, CXR findings worrisome for PNA. Pt also noted  to be in atrial fibrillation and was started on cardizem drip      Assessment / Plan / Recommendation Clinical Impression  Dysphagia Diagnosis: Mild pharyngeal phase dysphagia Clinical impression: Pt presents with a mild oropharyngeal  dysphagia with flash and trace frank penetration of airway during  the swallow. There was one instance where a trace amount passed  the vocal folds, was sensed and ejected out. The pt has decreased  laryngeal closure during the swallow allowing liquids and even  solids to enter vestibule and are typically ejected out. Do not  anticipate that this pt would be a risk of significant aspiration  based on performance during todays study. Despite this, he could  benefit from aspiration precautions to reduce risk. Recommend  Regular diet with thin liquids, no straws, small single sips. An  intermittent throat clear would also be helpful. SLP will f/u x1  to reinforce precautions given to pt today.     Treatment Recommendation  Therapy as outlined in treatment plan below    Diet Recommendation Regular;Thin liquid   Liquid Administration via:  Cup;Straw Medication Administration: Whole meds with liquid Supervision: Patient able to self  feed Compensations: Small sips/bites;Clear throat intermittently Postural Changes and/or Swallow Maneuvers: Seated upright 90  degrees;Out of bed for meals    Other  Recommendations Recommended Consults: MBS Oral Care Recommendations: Oral care BID   Follow Up Recommendations  None    Frequency and Duration min 1 x/week  1 week   Pertinent Vitals/Pain NA    SLP Swallow Goals     General HPI: Pt is 80 yo male with history of atrial fibrillation  on coumadin, systolic CHF, diffuse B cell lymphoma under Dr.  Alvy Bimler care and Dr. Lisbeth Renshaw (radiation oncologist), CKD stage III,  who presents to [facility] ED with main concern of 2 weeks duration of  progressively worsening shortness of breath, cough productive of  yellow sputum, fevers, chills, malaise, poor oral intake. He was  brought in by ambulance. Pt explains his dyspnea is worse with  exertion and somewhat better with resting but no specific  alleviating factors, no chest pian, no abdominal or urinary  concerns. Pt denies sick contacts or exposures. In ED, pt found  to be hypoxic with oxygen saturation in 80's, CXR findings  worrisome for PNA. Pt also noted to be in atrial fibrillation and  was started on cardizem drip  Type of Study: Modified Barium Swallowing Study Reason for Referral: Objectively evaluate swallowing function Previous Swallow Assessment: none Diet Prior to this Study: Regular;Thin liquids Temperature Spikes Noted: No Respiratory Status: Nasal cannula History of Recent Intubation: No Behavior/Cognition: Alert;Cooperative;Pleasant mood Oral Cavity - Dentition: Edentulous Oral Motor / Sensory Function: Within functional limits Self-Feeding Abilities: Able to feed self Patient Positioning: Upright in chair Baseline Vocal Quality: Clear Volitional Cough: Strong Volitional Swallow: Able to elicit Anatomy: Within functional limits Pharyngeal Secretions: Normal    Reason  for Referral Objectively evaluate swallowing function   Oral Phase Oral Preparation/Oral Phase Oral Phase: WFL   Pharyngeal Phase Pharyngeal Phase Pharyngeal Phase: Impaired Pharyngeal - Thin Pharyngeal - Thin Cup: Delayed swallow initiation;Reduced  epiglottic inversion;Reduced airway/laryngeal closure;Reduced  laryngeal elevation;Penetration/Aspiration during swallow;Trace  aspiration;Compensatory strategies attempted (Comment) (chin  tuck, ineffective) Penetration/Aspiration details (thin cup): Material enters  airway, passes BELOW cords then ejected out;Material enters  airway, CONTACTS cords then ejected out;Material enters airway,  remains ABOVE vocal cords then ejected out Pharyngeal - Thin Straw: Delayed swallow initiation;Reduced  epiglottic inversion;Reduced airway/laryngeal closure;Reduced  laryngeal elevation;Penetration/Aspiration during swallow Penetration/Aspiration details (thin straw): Material enters  airway, CONTACTS cords then ejected out;Material enters airway,  remains ABOVE vocal cords then ejected out Pharyngeal - Solids Pharyngeal - Puree: Delayed swallow initiation;Reduced epiglottic  inversion;Reduced airway/laryngeal closure;Reduced laryngeal  elevation;Penetration/Aspiration during swallow Penetration/Aspiration details (puree): Material enters airway,  remains ABOVE vocal cords then ejected out Pharyngeal - Regular: Delayed swallow initiation;Reduced  epiglottic inversion;Reduced airway/laryngeal closure;Reduced  laryngeal elevation;Penetration/Aspiration during swallow Penetration/Aspiration details (regular): Material enters airway,  remains ABOVE vocal cords then ejected out Pharyngeal - Pill: Delayed swallow initiation;Reduced epiglottic  inversion;Reduced airway/laryngeal closure;Reduced laryngeal  elevation;Penetration/Aspiration during swallow Penetration/Aspiration details (pill): Material enters airway,  remains ABOVE vocal cords then ejected out  Cervical Esophageal Phase     GO    Cervical Esophageal Phase Cervical Esophageal Phase: WFL (prominent CP, asymptomatic)        Herbie Baltimore, MA CCC-SLP [phone]  DeBlois, Katherene Ponto 01/23/2014, 2:34 PM     Microbiology: Recent Results (from the past 240 hour(s))  CULTURE, BLOOD (ROUTINE X 2)     Status: None   Collection Time    01/22/14  7:21 PM  Result Value Range Status   Specimen Description BLOOD RIGHT ARM   Final   Special Requests BOTTLES DRAWN AEROBIC ONLY 5CC   Final   Culture  Setup Time     Final   Value: 01/23/2014 00:59     Performed at [facility]   Culture     Final   Value:        BLOOD CULTURE RECEIVED NO GROWTH TO DATE CULTURE WILL BE HELD FOR 5 DAYS BEFORE ISSUING A FINAL NEGATIVE REPORT     Performed at [facility]   Report Status PENDING   Incomplete  CULTURE, BLOOD (ROUTINE X 2)     Status: None   Collection Time    01/22/14  7:30 PM      Result Value Range Status   Specimen Description BLOOD RIGHT HAND   Final   Special Requests BOTTLES DRAWN AEROBIC ONLY 10CC   Final   Culture  Setup Time     Final   Value: 01/23/2014 00:59     Performed at [facility]   Culture     Final   Value:        BLOOD CULTURE RECEIVED NO GROWTH TO DATE CULTURE WILL BE HELD FOR 5 DAYS BEFORE ISSUING A FINAL NEGATIVE REPORT     Performed at [facility]   Report Status PENDING   Incomplete  MRSA PCR SCREENING     Status: None   Collection Time    01/22/14  9:53 PM      Result Value Range Status   MRSA by PCR NEGATIVE  NEGATIVE Final   Comment:            The GeneXpert MRSA Assay (FDA     approved for NASAL specimens     only), is one component of a     comprehensive MRSA colonization     surveillance program. It is not     intended to diagnose MRSA     infection nor to guide or     monitor treatment for     MRSA infections.  URINE CULTURE     Status: None   Collection Time    01/22/14 10:15 PM      Result Value Range Status   Specimen Description URINE,  RANDOM   Final   Special Requests NONE   Final   Culture  Setup Time     Final   Value: 01/23/2014 00:03     Performed at [facility] Count     Final   Value: 30,000 COLONIES/ML     Performed at [facility]   Culture     Final   Value: Multiple bacterial morphotypes present, none predominant. Suggest appropriate recollection if clinically indicated.     Performed at [facility]   Report Status 01/23/2014 FINAL   Final  RESPIRATORY VIRUS PANEL     Status: None   Collection Time    01/23/14 12:38 AM      Result Value Range Status   Source - RVPAN NASOPHARYNGEAL   Final   Respiratory Syncytial Virus A NOT DETECTED   Final   Respiratory Syncytial Virus B NOT DETECTED   Final   Influenza A NOT DETECTED   Final   Influenza B NOT DETECTED   Final   Parainfluenza 1 NOT DETECTED   Final   Parainfluenza 2 NOT DETECTED   Final   Parainfluenza 3 NOT DETECTED  Final   Metapneumovirus NOT DETECTED   Final   Rhinovirus NOT DETECTED   Final   Adenovirus NOT DETECTED   Final   Influenza A H1 NOT DETECTED   Final   Influenza A H3 NOT DETECTED   Final   Comment: (NOTE)           Normal Reference Range for each Analyte: NOT DETECTED     Testing performed using the Luminex xTAG Respiratory Viral Panel test     kit.     This test was developed and its performance characteristics determined     by [facility]. It has not been cleared or approved by the [place]     Food and Drug Administration. This test is used for clinical purposes.     It should not be regarded as investigational or for research. This     laboratory is certified under the [place] (CLIA) as qualified to perform high complexity     clinical laboratory testing.     Performed at [place], EXPECTORATED SPUTUM-ASSESSMENT     Status: None   Collection Time    01/24/14  9:00 AM      Result Value Range Status   Specimen  Description SPUTUM   Final   Special Requests NONE   Final   Sputum evaluation     Final   Value: THIS SPECIMEN IS ACCEPTABLE. RESPIRATORY CULTURE REPORT TO FOLLOW.   Report Status 01/24/2014 FINAL   Final  CULTURE, RESPIRATORY (NON-EXPECTORATED)     Status: None   Collection Time    01/24/14  9:00 AM      Result Value Range Status   Specimen Description SPUTUM   Final   Special Requests NONE   Final   Gram Stain     Final   Value: FEW WBC PRESENT,BOTH PMN AND MONONUCLEAR     RARE SQUAMOUS EPITHELIAL CELLS PRESENT     RARE GRAM POSITIVE COCCI IN CHAINS     RARE GRAM NEGATIVE RODS     Performed at [facility]   Culture     Final   Value: FEW STREPTOCOCCUS,BETA HEMOLYTIC NOT GROUP A     Performed at [facility]   Report Status 01/26/2014 FINAL   Final     Labs: Basic Metabolic Panel:  Recent Labs Lab 01/23/14 0440 01/24/14 0340 01/25/14 0330 01/26/14 0500 01/27/14 0500  NA 140 140 141 142 143  K 4.9 4.7 4.3 4.7 4.5  CL 104 103 103 104 103  CO2 26 26 25 27 28   GLUCOSE 120* 91 97 109* 102*  BUN 36* 38* 38* 33* 27*  CREATININE 1.61* 1.60* 1.69* 1.56* 1.38*  CALCIUM 8.2* 8.1* 8.4 8.5 8.8   Liver Function Tests:  Recent Labs Lab 01/24/14 0340  AST 16  ALT 12  ALKPHOS 63  BILITOT 0.7  PROT 5.3*  ALBUMIN 2.8*   CBC:  Recent Labs Lab 01/22/14 1816 01/23/14 0440 01/24/14 0340 01/25/14 0330 01/26/14 0500  WBC 2.4* 2.3* 1.9* 2.4* 2.8*  NEUTROABS  --   --   --  0.9*  --   HGB 13.6 9.8* 9.5* 9.7* 10.6*  HCT 41.8 30.6* 28.8* 30.2* 33.6*  MCV 92.1 91.3 91.4 91.5 92.1  PLT 184 145* 124* 137* 165   Cardiac Enzymes:  Recent Labs Lab 01/22/14 2100 01/23/14 0430 01/23/14 1021  TROPONINI <0.30 <0.30 <0.30   BNP: BNP (last 3  results)  Recent Labs  07/22/13 1250 01/22/14 1816 01/24/14 0340  PROBNP 11199.0* 4885.0* 5238.0*   CBG:  Recent Labs Lab 01/23/14 0031  GLUCAP 41    Signed:  Coralynn Gaona  [facility] 01/27/2014, 1:24 PM

## 2014-01-27 NOTE — Progress Notes (Signed)
Porta cath deaccessed. Flushed with 10cc NS followed by Heparin 5ml (100u/ml). No bleeding to site, band aid to site for comfort. Gabriel Estrada 

## 2014-01-27 NOTE — Progress Notes (Signed)
Physical Therapy Treatment Patient Details Name: Zacory Fiola MRN: [id number] DOB: 01-26-1928 Today's Date: 01/27/2014 Time: 4854-6270 PT Time Calculation (min): 23 min  PT Assessment / Plan / Recommendation  History of Present Illness Pt is 80 yo male with history of atrial fibrillation on coumadin, systolic CHF, diffuse B cell lymphoma under Dr. Alvy Bimler care and Dr. Lisbeth Renshaw (radiation oncologist), CKD stage III, who presents to [facility] ED with main concern of 2 weeks duration of progressively worsening shortness of breath, cough productive of yellow sputum, fevers, chills, malaise, poor oral intake. He was brought in by ambulance. Pt explains his dyspnea is worse with exertion and somewhat better with resting but no specific alleviating factors, no chest pian, no abdominal or urinary concerns. Pt denies sick contacts or exposures. In ED, pt found to be hypoxic with oxygen saturation in 80's, CXR findings worrisome for PNA. Pt also noted to be in atrial fibrillation and was started on cardizem drip   PT Comments   Pt progressing well with use of O2 and RW. Pt with sats 94% at rest on 2L with drop to 86% on 4L with 70' ambulation, on 6L able to maintain 92-95% with activity. HR 105. Pt educated for energy conservation, continued need for RW with all mobility and activity progression. Will continue to follow.   Follow Up Recommendations  Home health PT;Supervision for mobility/OOB     Does the patient have the potential to tolerate intense rehabilitation     Barriers to Discharge        Equipment Recommendations       Recommendations for Other Services    Frequency     Progress towards PT Goals Progress towards PT goals: Progressing toward goals  Plan Current plan remains appropriate    Precautions / Restrictions Precautions Precautions: Fall   Pertinent Vitals/Pain No pain    Mobility  Bed Mobility Overal bed mobility: Modified Independent Transfers Overall transfer level: Needs  assistance Equipment used: Rolling walker (2 wheeled) Transfers: Sit to/from Stand Sit to Stand: Supervision General transfer comment: cueing for hand placement and safe descent Ambulation/Gait Ambulation/Gait assistance: Supervision Ambulation Distance (Feet): 200 Feet Assistive device: Rolling walker (2 wheeled) Gait Pattern/deviations: Step-through pattern;Decreased stride length Gait velocity interpretation: at or above normal speed for age/gender General Gait Details: increased stride, speed and posture with use of RW with cues to step into RW and cues for energy conservation and pursed lip breathing Stairs: Yes Stairs assistance: Modified independent (Device/Increase time) Stair Management: Two rails;Forwards;Alternating pattern Number of Stairs: 5    Exercises General Exercises - Lower Extremity Long Arc Quad: AROM;Seated;Both;20 reps Hip ABduction/ADduction: AROM;Seated;Both;20 reps Hip Flexion/Marching: AROM;Seated;Both;20 reps Toe Raises: AROM;Seated;Both;20 reps Heel Raises: AROM;Seated;Both;20 reps   PT Diagnosis:    PT Problem List:   PT Treatment Interventions:     PT Goals (current goals can now be found in the care plan section)    Visit Information  Last PT Received On: 01/27/14 Assistance Needed: +1 History of Present Illness: Pt is 80 yo male with history of atrial fibrillation on coumadin, systolic CHF, diffuse B cell lymphoma under Dr. Alvy Bimler care and Dr. Lisbeth Renshaw (radiation oncologist), CKD stage III, who presents to [facility] ED with main concern of 2 weeks duration of progressively worsening shortness of breath, cough productive of yellow sputum, fevers, chills, malaise, poor oral intake. He was brought in by ambulance. Pt explains his dyspnea is worse with exertion and somewhat better with resting but no specific alleviating factors, no chest pian, no  abdominal or urinary concerns. Pt denies sick contacts or exposures. In ED, pt found to be hypoxic with oxygen  saturation in 80's, CXR findings worrisome for PNA. Pt also noted to be in atrial fibrillation and was started on cardizem drip    Subjective Data      Cognition  Cognition Arousal/Alertness: Awake/alert Behavior During Therapy: WFL for tasks assessed/performed Overall Cognitive Status: Within Functional Limits for tasks assessed    Balance     End of Session PT - End of Session Equipment Utilized During Treatment: Gait belt;Oxygen Activity Tolerance: Patient tolerated treatment well Patient left: in chair;with call bell/phone within reach   GP     Lanetta Inch [facility] 01/27/2014, 10:38 AM Elwyn Reach, [place]

## 2014-01-27 NOTE — Care Management Note (Signed)
    Page 1 of 2   01/27/2014     11:03:40 AM   CARE MANAGEMENT NOTE 01/27/2014  Patient:  Gabriel Estrada, Gabriel Estrada   Account Number:  [id number]  Date Initiated:  01/27/2014  Documentation initiated by:  [facility]  Subjective/Objective Assessment:   80 yo male PMHx Afib, , systolic CHF, diffuse B cell lymphoma, CKD stage III, who presents with main concern of 2 weeks duration of SOB//Home with sister     Action/Plan:   IV diureses//Home with HH   Anticipated DC Date:  01/27/2014   Anticipated DC Plan:  [place]  CM consult      [facility] Choice  HOME HEALTH   Choice offered to / List presented to:  C-1 Patient        [facility] arranged  HH-1 RN  [facility]      [facility].   Status of service:  Completed, signed off Medicare Important Message given?  YES (If response is "NO", the following Medicare IM given date fields will be blank) Date Medicare IM given:   Date Additional Medicare IM given:  01/24/2014  Discharge Disposition:    Per UR Regulation:    If discussed at Long Length of Stay Meetings, dates discussed:    Comments:  01/27/14 [place], RN, BSN, [employer] [phone] Spoke with pt at bedside regarding discharge planning for [facility]. Offered pt list of home health agencies to choose from.  Pt chose [facility] to render services of RN/ PT. Clover Mealy, RN of [facility] notified.  No DME needs identified at this time.

## 2014-01-27 NOTE — Progress Notes (Signed)
Pt being dc to home with home health, pt leaving with O2, dc instruction given to pt, medications and follow up appointments reviewed with pt and son, both verbalized understanding, pt left via wheelchair with son, pt stable

## 2014-01-27 NOTE — Progress Notes (Signed)
SATURATION QUALIFICATIONS: (This note is used to comply with regulatory documentation for home oxygen)  Patient Saturations on Room Air at Rest = 86%  Patient Saturations on Room Air while Ambulating = NA%  Patient Saturations on 2-4 Liters of oxygen while Ambulating = 94%  Please briefly explain why patient needs home oxygen:

## 2014-01-27 NOTE — Progress Notes (Signed)
Speech Language Pathology Treatment: Dysphagia  Patient Details Name: Brandn Mcgath MRN: [id number] DOB: Jan 24, 1928 Today's Date: 01/27/2014 Time: 1030-1040 SLP Time Calculation (min): 10 min  Assessment / Plan / Recommendation Clinical Impression  Pt demonstrates adequate tolerance of liquids. SLP instructed him and son in recommended precautions and pt return demonstrated. Pt does report that he finds food difficult to chew, will downgrade diet and sign off, no further acute needs.    HPI HPI: Pt is 80 yo male with history of atrial fibrillation on coumadin, systolic CHF, diffuse B cell lymphoma under Dr. Alvy Bimler care and Dr. Lisbeth Renshaw (radiation oncologist), CKD stage III, who presents to [facility] ED with main concern of 2 weeks duration of progressively worsening shortness of breath, cough productive of yellow sputum, fevers, chills, malaise, poor oral intake. He was brought in by ambulance. Pt explains his dyspnea is worse with exertion and somewhat better with resting but no specific alleviating factors, no chest pian, no abdominal or urinary concerns. Pt denies sick contacts or exposures. In ED, pt found to be hypoxic with oxygen saturation in 80's, CXR findings worrisome for PNA. Pt also noted to be in atrial fibrillation and was started on cardizem drip    Pertinent Vitals NA  SLP Plan  All goals met    Recommendations Diet recommendations: Dysphagia 3 (mechanical soft);Thin liquid Medication Administration: Whole meds with liquid Supervision: Patient able to self feed Compensations: Small sips/bites;Clear throat intermittently Postural Changes and/or Swallow Maneuvers: Seated upright 90 degrees;Out of bed for meals              Oral Care Recommendations: Oral care BID Follow up Recommendations: None Plan: All goals met    GO    Herbie [place], MA CCC-SLP [phone]  Lynann Beaver 01/27/2014, 1:19 PM

## 2014-01-27 NOTE — Progress Notes (Signed)
Pt stable, VSS.  Pt d/c'd home with son.

## 2014-01-27 NOTE — Progress Notes (Signed)
Pharmacy Note-Anticoagulation  Pharmacy Consult :  80 y.o. male is currently on Coumadin for atrial fibrillation w/RVR .   Latest Labs : Hematology :  Recent Labs  01/25/14 0330 01/26/14 0500 01/27/14 0500  HGB 9.7* 10.6*  --   HCT 30.2* 33.6*  --   PLT 137* 165  --   LABPROT 26.0* 20.6* 19.5*  INR 2.48* 1.83* 1.70*  CREATININE 1.69* 1.56* 1.38*   Current Medication[s] Include: Scheduled:  Scheduled:  . azithromycin  500 mg Oral Daily  . carvedilol  6.25 mg Oral BID WC  . ceFEPime (MAXIPIME) IV  2 g Intravenous Q24H  . digoxin  0.125 mg Oral Daily  . docusate sodium  100 mg Oral BID  . ezetimibe-simvastatin  1 tablet Oral QHS  . furosemide  20 mg Oral Daily  . levalbuterol  1.25 mg Nebulization TID  . polyethylene glycol  17 g Oral Daily  . vancomycin  1,000 mg Intravenous Q24H  . Warfarin - Pharmacist Dosing Inpatient   Does not apply q1800   Infusion[s]: Infusions:  . sodium chloride 20 mL/hr at 01/26/14 2344   Antibiotic[s]: Anti-infectives   Start     Dose/Rate Route Frequency Ordered Stop   01/25/14 1600  azithromycin (ZITHROMAX) tablet 500 mg     500 mg Oral Daily 01/25/14 1517     01/23/14 2000  azithromycin (ZITHROMAX) 500 mg in dextrose 5 % 250 mL IVPB  Status:  Discontinued     500 mg 250 mL/hr over 60 Minutes Intravenous Every 24 hours 01/23/14 0849 01/25/14 1517   01/22/14 2200  ceFEPIme (MAXIPIME) 1 g in dextrose 5 % 50 mL IVPB  Status:  Discontinued     1 g 100 mL/hr over 30 Minutes Intravenous 3 times per day 01/22/14 2104 01/22/14 2113   01/22/14 2200  ceFEPIme (MAXIPIME) 2 g in dextrose 5 % 50 mL IVPB     2 g 100 mL/hr over 30 Minutes Intravenous Every 24 hours 01/22/14 2113 01/30/14 2159   01/22/14 2200  vancomycin (VANCOCIN) IVPB 1000 mg/200 mL premix     1,000 mg 200 mL/hr over 60 Minutes Intravenous Every 24 hours 01/22/14 2113 01/30/14 2159   01/22/14 1945  cefTRIAXone (ROCEPHIN) 1 g in dextrose 5 % 50 mL IVPB  Status:  Discontinued     1  g 100 mL/hr over 30 Minutes Intravenous  Once 01/22/14 1940 01/22/14 2104   01/22/14 1945  azithromycin (ZITHROMAX) 500 mg in dextrose 5 % 250 mL IVPB     500 mg 250 mL/hr over 60 Minutes Intravenous  Once 01/22/14 1940 01/22/14 2129      Assessment :  Today's INR is Subtherapeutic despite increasing Coumadin dose.   INR is 1.7.    No bleeding complications observed.  Goal :  INR goal is 2-3    Plan : 1. Coumadin 5 mg po today. 2. Daily INR's, CBC. Monitor for bleeding complications  Estelle June, Pharm.D. 01/27/2014  10:04 AM

## 2014-01-29 LAB — CULTURE, BLOOD (ROUTINE X 2)
CULTURE: NO GROWTH
Culture: NO GROWTH

## 2014-01-30 ENCOUNTER — Ambulatory Visit (INDEPENDENT_AMBULATORY_CARE_PROVIDER_SITE_OTHER): Payer: Medicare Other | Admitting: Cardiology

## 2014-01-30 ENCOUNTER — Telehealth: Payer: Self-pay | Admitting: *Deleted

## 2014-01-30 DIAGNOSIS — Z7901 Long term (current) use of anticoagulants: Secondary | ICD-10-CM

## 2014-01-30 DIAGNOSIS — I4891 Unspecified atrial fibrillation: Secondary | ICD-10-CM

## 2014-01-30 LAB — POCT INR: INR: 2

## 2014-01-30 NOTE — Telephone Encounter (Signed)
Tiffany from [facility] IR called to report pt does not want to schedule his Port a Cath removal yet.  He was recently d/c'd from the hospital and wants to wait a while.  He will call them back when he is ready to schedule the removal.

## 2014-02-03 ENCOUNTER — Encounter: Admitting: Physician Assistant

## 2014-02-05 ENCOUNTER — Ambulatory Visit: Payer: Self-pay | Admitting: Internal Medicine

## 2014-02-06 ENCOUNTER — Ambulatory Visit (INDEPENDENT_AMBULATORY_CARE_PROVIDER_SITE_OTHER): Payer: Medicare Other | Admitting: Cardiovascular Disease

## 2014-02-06 DIAGNOSIS — Z7901 Long term (current) use of anticoagulants: Secondary | ICD-10-CM

## 2014-02-06 DIAGNOSIS — I4891 Unspecified atrial fibrillation: Secondary | ICD-10-CM

## 2014-02-06 LAB — POCT INR: INR: 2.3

## 2014-02-10 DIAGNOSIS — J189 Pneumonia, unspecified organism: Secondary | ICD-10-CM

## 2014-02-10 DIAGNOSIS — I5032 Chronic diastolic (congestive) heart failure: Secondary | ICD-10-CM

## 2014-02-10 DIAGNOSIS — I509 Heart failure, unspecified: Secondary | ICD-10-CM

## 2014-02-12 ENCOUNTER — Ambulatory Visit: Payer: Self-pay | Admitting: Internal Medicine

## 2014-02-18 ENCOUNTER — Other Ambulatory Visit: Payer: Self-pay | Admitting: Cardiology

## 2014-02-24 ENCOUNTER — Other Ambulatory Visit: Payer: Self-pay | Admitting: *Deleted

## 2014-02-24 ENCOUNTER — Telehealth: Payer: Self-pay | Admitting: *Deleted

## 2014-02-24 MED ORDER — WARFARIN SODIUM 5 MG PO TABS: ORAL_TABLET | ORAL | Status: DC

## 2014-02-24 MED ORDER — CARVEDILOL 6.25 MG PO TABS: ORAL_TABLET | ORAL | Status: DC

## 2014-02-24 NOTE — Telephone Encounter (Signed)
Refill

## 2014-02-24 NOTE — Telephone Encounter (Signed)
[facility] requests warfarin refill for patient. Thanks, MI

## 2014-02-25 ENCOUNTER — Ambulatory Visit: Payer: Medicare Other | Admitting: Cardiology

## 2014-02-27 ENCOUNTER — Other Ambulatory Visit: Payer: Self-pay

## 2014-02-27 MED ORDER — CARVEDILOL 6.25 MG PO TABS: ORAL_TABLET | ORAL | Status: DC

## 2014-03-05 ENCOUNTER — Ambulatory Visit (INDEPENDENT_AMBULATORY_CARE_PROVIDER_SITE_OTHER): Admitting: Cardiology

## 2014-03-05 DIAGNOSIS — I4891 Unspecified atrial fibrillation: Secondary | ICD-10-CM

## 2014-03-05 DIAGNOSIS — Z7901 Long term (current) use of anticoagulants: Secondary | ICD-10-CM

## 2014-03-05 LAB — POCT INR: INR: 3.9

## 2014-03-12 ENCOUNTER — Ambulatory Visit (INDEPENDENT_AMBULATORY_CARE_PROVIDER_SITE_OTHER): Payer: Medicare Other | Admitting: Cardiology

## 2014-03-12 DIAGNOSIS — I4891 Unspecified atrial fibrillation: Secondary | ICD-10-CM

## 2014-03-12 DIAGNOSIS — Z7901 Long term (current) use of anticoagulants: Secondary | ICD-10-CM

## 2014-03-12 LAB — POCT INR: INR: 2.8

## 2014-03-24 ENCOUNTER — Telehealth: Payer: Self-pay | Admitting: Internal Medicine

## 2014-03-24 MED ORDER — UNABLE TO FIND: Status: DC

## 2014-03-24 NOTE — Telephone Encounter (Signed)
Rx printed/faxed to [place].

## 2014-03-24 NOTE — Telephone Encounter (Signed)
Bill needs a portable O2 from [facility] Pls arrange. Dx COPD. Thx

## 2014-03-27 ENCOUNTER — Telehealth: Payer: Self-pay | Admitting: *Deleted

## 2014-03-27 NOTE — Telephone Encounter (Signed)
[facility] did not do INR that was ordered on 03/26/14 but they will draw on 03/28/14 and call us from home with result.

## 2014-03-28 ENCOUNTER — Ambulatory Visit: Payer: Self-pay | Admitting: Pharmacist

## 2014-03-28 NOTE — Progress Notes (Signed)
erroneous

## 2014-04-04 ENCOUNTER — Telehealth: Payer: Self-pay | Admitting: Hematology and Oncology

## 2014-04-04 NOTE — Telephone Encounter (Signed)
pt called and cancelled appt for 4/22 with MD and lab

## 2014-04-07 ENCOUNTER — Telehealth: Payer: Self-pay | Admitting: Internal Medicine

## 2014-04-07 NOTE — Telephone Encounter (Signed)
Juliann Pulse with [facility] is calling to see if Dr. Alain Marion can be the patient's attending physician with hospice and have their doctors help with symptom management. Please call her back at [phone] to confirm.

## 2014-04-07 NOTE — Telephone Encounter (Signed)
Called Kathy--no answer. Will try again tomorrow.

## 2014-04-08 ENCOUNTER — Ambulatory Visit: Admitting: Internal Medicine

## 2014-04-08 DIAGNOSIS — Z0289 Encounter for other administrative examinations: Secondary | ICD-10-CM

## 2014-04-09 ENCOUNTER — Ambulatory Visit (INDEPENDENT_AMBULATORY_CARE_PROVIDER_SITE_OTHER): Payer: Medicare Other | Admitting: *Deleted

## 2014-04-09 DIAGNOSIS — I4891 Unspecified atrial fibrillation: Secondary | ICD-10-CM

## 2014-04-09 DIAGNOSIS — Z7901 Long term (current) use of anticoagulants: Secondary | ICD-10-CM

## 2014-04-09 LAB — POCT INR: INR: 2.7

## 2014-04-10 NOTE — Telephone Encounter (Signed)
Juliann Pulse informed PCP will be attending with [facility] MD support.

## 2014-04-16 ENCOUNTER — Other Ambulatory Visit: Payer: Medicare Other

## 2014-04-16 ENCOUNTER — Ambulatory Visit: Payer: Medicare Other | Admitting: Hematology and Oncology

## 2014-04-23 ENCOUNTER — Telehealth: Payer: Self-pay | Admitting: *Deleted

## 2014-04-23 ENCOUNTER — Other Ambulatory Visit: Payer: Self-pay | Admitting: Cardiology

## 2014-04-23 NOTE — Telephone Encounter (Signed)
Carol from [facility]osp[facility] called to advise pt is having bilateral edema up to the calf.  States pt is taking Lasix 20mg  daily, denies shortness of breath.  Please advise

## 2014-04-24 NOTE — Telephone Encounter (Signed)
Spoke with pt.  Appointment scheduled 5.1.15 @10 :15am

## 2014-04-24 NOTE — Telephone Encounter (Signed)
Can he come to the office for an OV? Thx

## 2014-04-25 ENCOUNTER — Ambulatory Visit: Admitting: Internal Medicine

## 2014-04-25 DIAGNOSIS — Z0289 Encounter for other administrative examinations: Secondary | ICD-10-CM

## 2014-04-30 ENCOUNTER — Telehealth: Payer: Self-pay | Admitting: Cardiology

## 2014-04-30 ENCOUNTER — Ambulatory Visit (INDEPENDENT_AMBULATORY_CARE_PROVIDER_SITE_OTHER): Payer: Medicare Other | Admitting: Internal Medicine

## 2014-04-30 DIAGNOSIS — I4891 Unspecified atrial fibrillation: Secondary | ICD-10-CM

## 2014-04-30 DIAGNOSIS — Z7901 Long term (current) use of anticoagulants: Secondary | ICD-10-CM

## 2014-04-30 LAB — POCT INR: INR: 3.8

## 2014-04-30 NOTE — Telephone Encounter (Signed)
New message   Home health in the home now    Patient has Increase edema in ankle - extended to calf , knee.    On lasix 20 mg daily .     Vital sign  98/60, 62, 95 % , on 2 liter of oxygen. No dyspnea.

## 2014-04-30 NOTE — Telephone Encounter (Signed)
Spoke with Hospice nurse.  Pt is uncomfortable d/t lower extremity edema.  He is keeping his feet elevated "as much as he can"  He is on Lasix 20 mg a day.  He had not had any recent lab work and doesn't want any per the Hospice nurse.  BUN and Crea were elevated on last check 01/27/14 BUN 27 Crea 1.38.  Advised Dr Percival Spanish not in today but I will document and send to him for his knowledge.  They had previously called Dr Alain Marion for treatment who wanted to see the pt in the office but he no showed because he doesn't want to come to office.  He only wants comfort care at this point.  Advised pt should wear knee high TED hose, keep feet elevated above the level of his heart, decrease NA/salt and take meds as ordered.  Aware I will forward to MD for review and orders.

## 2014-05-01 NOTE — Telephone Encounter (Signed)
Spoke with Gabriel Estrada with [facility] and advised her of Dr. Rosezella [place] advice to take Lasix 40 mg x 2 days.  Gabriel Estrada verbalized understanding and agreement and will call back if symptoms do not improve.

## 2014-05-01 NOTE — Telephone Encounter (Signed)
I would have him increase to 40 mg of Lasix for 2 days.

## 2014-05-07 ENCOUNTER — Ambulatory Visit (INDEPENDENT_AMBULATORY_CARE_PROVIDER_SITE_OTHER): Payer: Medicare Other | Admitting: Pharmacist

## 2014-05-07 ENCOUNTER — Telehealth: Payer: Self-pay | Admitting: Cardiology

## 2014-05-07 DIAGNOSIS — I4891 Unspecified atrial fibrillation: Secondary | ICD-10-CM

## 2014-05-07 DIAGNOSIS — Z7901 Long term (current) use of anticoagulants: Secondary | ICD-10-CM

## 2014-05-07 LAB — POCT INR: INR: 3.5

## 2014-05-07 NOTE — Telephone Encounter (Signed)
New message          Gabriel Estrada calling to report pt INR check for today: 3.5

## 2014-05-07 NOTE — Telephone Encounter (Signed)
Patient to hold warfarin x 1, then reduce to 5 mg qd except 2.5 mg Friday and Sunday.  Recheck INR in 1 week - see anticoag encounter.

## 2014-05-14 ENCOUNTER — Telehealth: Payer: Self-pay | Admitting: Cardiology

## 2014-05-14 ENCOUNTER — Ambulatory Visit (INDEPENDENT_AMBULATORY_CARE_PROVIDER_SITE_OTHER): Payer: Medicare Other | Admitting: Cardiovascular Disease

## 2014-05-14 DIAGNOSIS — Z7901 Long term (current) use of anticoagulants: Secondary | ICD-10-CM

## 2014-05-14 DIAGNOSIS — I4891 Unspecified atrial fibrillation: Secondary | ICD-10-CM

## 2014-05-14 LAB — POCT INR: INR: 2.3

## 2014-05-14 NOTE — Telephone Encounter (Signed)
Report      Gabriel Estrada wants to report and INR please.2.3  # [phone]  Per Jonelle Sidle will call back.

## 2014-05-14 NOTE — Telephone Encounter (Signed)
See Anticoagulation note

## 2014-05-21 ENCOUNTER — Ambulatory Visit (INDEPENDENT_AMBULATORY_CARE_PROVIDER_SITE_OTHER): Payer: Medicare Other | Admitting: Cardiovascular Disease

## 2014-05-21 DIAGNOSIS — I4891 Unspecified atrial fibrillation: Secondary | ICD-10-CM

## 2014-05-21 DIAGNOSIS — Z7901 Long term (current) use of anticoagulants: Secondary | ICD-10-CM

## 2014-05-21 LAB — POCT INR: INR: 3.2

## 2014-05-26 ENCOUNTER — Other Ambulatory Visit: Payer: Self-pay | Admitting: Cardiology

## 2014-05-28 ENCOUNTER — Ambulatory Visit (INDEPENDENT_AMBULATORY_CARE_PROVIDER_SITE_OTHER): Payer: Medicare Other | Admitting: Pharmacist

## 2014-05-28 ENCOUNTER — Telehealth: Payer: Self-pay | Admitting: Cardiology

## 2014-05-28 DIAGNOSIS — Z7901 Long term (current) use of anticoagulants: Secondary | ICD-10-CM

## 2014-05-28 DIAGNOSIS — I4891 Unspecified atrial fibrillation: Secondary | ICD-10-CM

## 2014-05-28 LAB — POCT INR: INR: 2.6

## 2014-05-28 NOTE — Telephone Encounter (Signed)
Contacted pts Gabriel Estrada and she was wanting to ask Dr Percival Spanish and Nurse,  if for future refills of pts cardiac medications, would they be filled without pt coming in for an f/u OV.  Nurse Arbie Cookey states pt is getting to the point where he cannot travel far to come to f/u appointments and he is declining, but still needs his meds refilled in the future.  Informed Arbie Cookey that Dr Percival Spanish and Nurse are out of the office today, but will return tomorrow.  Informed her that I will forward to both Dr Percival Spanish and nurse for further review and recommendation.  Hospice nurse Arbie Cookey advised for them to contact Abigail Butts other Hospice Nurse working with this pt at [phone].  Nurse Arbie Cookey verbalized understanding and pleased with the call back.

## 2014-05-28 NOTE — Telephone Encounter (Signed)
Patient is now in hospice. And needs RF on all med's. Can these be refilled, its very difficult for this patient to come in. Please call and advise.

## 2014-05-29 NOTE — Telephone Encounter (Signed)
Gabriel Estrada is not available - Catalina Lunger aware Dr Percival Spanish will continue to refill meds.  She will let Gabriel Estrada know.

## 2014-05-29 NOTE — Telephone Encounter (Signed)
Yes

## 2014-06-04 ENCOUNTER — Ambulatory Visit (INDEPENDENT_AMBULATORY_CARE_PROVIDER_SITE_OTHER): Payer: Medicare Other | Admitting: Pharmacist

## 2014-06-04 DIAGNOSIS — Z7901 Long term (current) use of anticoagulants: Secondary | ICD-10-CM

## 2014-06-04 DIAGNOSIS — I4891 Unspecified atrial fibrillation: Secondary | ICD-10-CM

## 2014-06-04 LAB — POCT INR: INR: 2.1

## 2014-06-11 ENCOUNTER — Other Ambulatory Visit: Payer: Self-pay | Admitting: Cardiology

## 2014-06-12 ENCOUNTER — Other Ambulatory Visit: Payer: Self-pay | Admitting: *Deleted

## 2014-06-12 ENCOUNTER — Telehealth: Payer: Self-pay | Admitting: *Deleted

## 2014-06-12 MED ORDER — CARVEDILOL 6.25 MG PO TABS: 6.2500 mg | ORAL_TABLET | Freq: Two times a day (BID) | ORAL | Status: AC

## 2014-06-12 MED ORDER — WARFARIN SODIUM 5 MG PO TABS: ORAL_TABLET | ORAL | Status: DC

## 2014-06-12 NOTE — Addendum Note (Signed)
Addended by: Theophilus Kinds on: 06/12/2014 10:49 AM   Modules accepted: Orders

## 2014-06-12 NOTE — Telephone Encounter (Signed)
[place] [facility] requests coumadin refill for patient. Thanks, MI

## 2014-06-12 NOTE — Telephone Encounter (Signed)
Pharmacy called for coreg refill. Verbal given and they will make patient aware that he must keep August appointment for further refills.

## 2014-06-18 ENCOUNTER — Ambulatory Visit (INDEPENDENT_AMBULATORY_CARE_PROVIDER_SITE_OTHER): Payer: Medicare Other | Admitting: Pharmacist

## 2014-06-18 DIAGNOSIS — Z7901 Long term (current) use of anticoagulants: Secondary | ICD-10-CM

## 2014-06-18 DIAGNOSIS — I4891 Unspecified atrial fibrillation: Secondary | ICD-10-CM

## 2014-06-18 LAB — POCT INR: INR: 3.6

## 2014-06-24 ENCOUNTER — Ambulatory Visit (INDEPENDENT_AMBULATORY_CARE_PROVIDER_SITE_OTHER): Payer: Medicare Other | Admitting: Pharmacist Clinician (PhC)/ Clinical Pharmacy Specialist

## 2014-06-24 ENCOUNTER — Other Ambulatory Visit: Payer: Self-pay | Admitting: Cardiology

## 2014-06-24 DIAGNOSIS — I4891 Unspecified atrial fibrillation: Secondary | ICD-10-CM

## 2014-06-24 DIAGNOSIS — Z7901 Long term (current) use of anticoagulants: Secondary | ICD-10-CM

## 2014-06-24 LAB — POCT INR: INR: 3.7

## 2014-06-26 ENCOUNTER — Inpatient Hospital Stay (HOSPITAL_COMMUNITY)
Admission: EM | Admit: 2014-06-26 | Discharge: 2014-07-01 | DRG: 291 | Disposition: A | Discharge status: Hospice | Attending: Internal Medicine | Admitting: Internal Medicine

## 2014-06-26 ENCOUNTER — Emergency Department (HOSPITAL_COMMUNITY)

## 2014-06-26 DIAGNOSIS — I739 Peripheral vascular disease, unspecified: Secondary | ICD-10-CM | POA: Diagnosis present

## 2014-06-26 DIAGNOSIS — J4489 Other specified chronic obstructive pulmonary disease: Secondary | ICD-10-CM | POA: Diagnosis present

## 2014-06-26 DIAGNOSIS — Z9981 Dependence on supplemental oxygen: Secondary | ICD-10-CM

## 2014-06-26 DIAGNOSIS — I509 Heart failure, unspecified: Secondary | ICD-10-CM | POA: Diagnosis present

## 2014-06-26 DIAGNOSIS — I4891 Unspecified atrial fibrillation: Secondary | ICD-10-CM | POA: Diagnosis present

## 2014-06-26 DIAGNOSIS — Z87891 Personal history of nicotine dependence: Secondary | ICD-10-CM

## 2014-06-26 DIAGNOSIS — N183 Chronic kidney disease, stage 3 unspecified: Secondary | ICD-10-CM | POA: Diagnosis present

## 2014-06-26 DIAGNOSIS — I6529 Occlusion and stenosis of unspecified carotid artery: Secondary | ICD-10-CM

## 2014-06-26 DIAGNOSIS — E785 Hyperlipidemia, unspecified: Secondary | ICD-10-CM | POA: Diagnosis present

## 2014-06-26 DIAGNOSIS — I5043 Acute on chronic combined systolic (congestive) and diastolic (congestive) heart failure: Principal | ICD-10-CM | POA: Diagnosis present

## 2014-06-26 DIAGNOSIS — E782 Mixed hyperlipidemia: Secondary | ICD-10-CM

## 2014-06-26 DIAGNOSIS — Z515 Encounter for palliative care: Secondary | ICD-10-CM

## 2014-06-26 DIAGNOSIS — I252 Old myocardial infarction: Secondary | ICD-10-CM

## 2014-06-26 DIAGNOSIS — Z7901 Long term (current) use of anticoagulants: Secondary | ICD-10-CM

## 2014-06-26 DIAGNOSIS — J449 Chronic obstructive pulmonary disease, unspecified: Secondary | ICD-10-CM | POA: Diagnosis present

## 2014-06-26 DIAGNOSIS — Z66 Do not resuscitate: Secondary | ICD-10-CM | POA: Diagnosis present

## 2014-06-26 DIAGNOSIS — I255 Ischemic cardiomyopathy: Secondary | ICD-10-CM | POA: Diagnosis present

## 2014-06-26 DIAGNOSIS — I2510 Atherosclerotic heart disease of native coronary artery without angina pectoris: Secondary | ICD-10-CM | POA: Diagnosis present

## 2014-06-26 DIAGNOSIS — I5022 Chronic systolic (congestive) heart failure: Secondary | ICD-10-CM

## 2014-06-26 DIAGNOSIS — I129 Hypertensive chronic kidney disease with stage 1 through stage 4 chronic kidney disease, or unspecified chronic kidney disease: Secondary | ICD-10-CM | POA: Diagnosis present

## 2014-06-26 DIAGNOSIS — J189 Pneumonia, unspecified organism: Secondary | ICD-10-CM

## 2014-06-26 DIAGNOSIS — Z951 Presence of aortocoronary bypass graft: Secondary | ICD-10-CM

## 2014-06-26 DIAGNOSIS — C8589 Other specified types of non-Hodgkin lymphoma, extranodal and solid organ sites: Secondary | ICD-10-CM | POA: Diagnosis present

## 2014-06-26 DIAGNOSIS — C8330 Diffuse large B-cell lymphoma, unspecified site: Secondary | ICD-10-CM | POA: Diagnosis present

## 2014-06-26 DIAGNOSIS — Z79899 Other long term (current) drug therapy: Secondary | ICD-10-CM

## 2014-06-26 DIAGNOSIS — J9601 Acute respiratory failure with hypoxia: Secondary | ICD-10-CM | POA: Diagnosis present

## 2014-06-26 DIAGNOSIS — R55 Syncope and collapse: Secondary | ICD-10-CM | POA: Diagnosis present

## 2014-06-26 DIAGNOSIS — F17200 Nicotine dependence, unspecified, uncomplicated: Secondary | ICD-10-CM

## 2014-06-26 DIAGNOSIS — I2589 Other forms of chronic ischemic heart disease: Secondary | ICD-10-CM | POA: Diagnosis present

## 2014-06-26 DIAGNOSIS — J9600 Acute respiratory failure, unspecified whether with hypoxia or hypercapnia: Secondary | ICD-10-CM | POA: Diagnosis present

## 2014-06-26 LAB — CBC
HCT: 42.9 % (ref 39.0–52.0)
Hemoglobin: 13.4 g/dL (ref 13.0–17.0)
MCH: 30.1 pg (ref 26.0–34.0)
MCHC: 31.2 g/dL (ref 30.0–36.0)
MCV: 96.4 fL (ref 78.0–100.0)
Platelets: 149 10*3/uL — ABNORMAL LOW (ref 150–400)
RBC: 4.45 MIL/uL (ref 4.22–5.81)
RDW: 16.1 % — AB (ref 11.5–15.5)
WBC: 5.8 10*3/uL (ref 4.0–10.5)

## 2014-06-26 LAB — I-STAT ARTERIAL BLOOD GAS, ED
ACID-BASE EXCESS: 5 mmol/L — AB (ref 0.0–2.0)
Bicarbonate: 31.7 mEq/L — ABNORMAL HIGH (ref 20.0–24.0)
O2 Saturation: 88 %
PCO2 ART: 55.1 mmHg — AB (ref 35.0–45.0)
PO2 ART: 56 mmHg — AB (ref 80.0–100.0)
Patient temperature: 97.7
TCO2: 33 mmol/L (ref 0–100)
pH, Arterial: 7.366 (ref 7.350–7.450)

## 2014-06-26 LAB — BASIC METABOLIC PANEL
Anion gap: 13 (ref 5–15)
BUN: 55 mg/dL — ABNORMAL HIGH (ref 6–23)
CO2: 31 mEq/L (ref 19–32)
Calcium: 9.4 mg/dL (ref 8.4–10.5)
Chloride: 98 mEq/L (ref 96–112)
Creatinine, Ser: 1.6 mg/dL — ABNORMAL HIGH (ref 0.50–1.35)
GFR calc Af Amer: 44 mL/min — ABNORMAL LOW (ref >90)
GFR calc non Af Amer: 38 mL/min — ABNORMAL LOW (ref >90)
Glucose, Bld: 123 mg/dL — ABNORMAL HIGH (ref 70–99)
Potassium: 5 mEq/L (ref 3.7–5.3)
Sodium: 142 mEq/L (ref 137–147)

## 2014-06-26 LAB — PRO B NATRIURETIC PEPTIDE: PRO B NATRI PEPTIDE: 12719 pg/mL — AB (ref 0–450)

## 2014-06-26 LAB — PROTIME-INR
INR: 2.76 — ABNORMAL HIGH (ref 0.00–1.49)
Prothrombin Time: 29.2 seconds — ABNORMAL HIGH (ref 11.6–15.2)

## 2014-06-26 LAB — I-STAT TROPONIN, ED: TROPONIN I, POC: 0.04 ng/mL (ref 0.00–0.08)

## 2014-06-26 MED ORDER — FUROSEMIDE 10 MG/ML IJ SOLN
80.0000 mg | Freq: Once | INTRAMUSCULAR | Status: AC
  Administered 2014-06-26: 80 mg via INTRAVENOUS
  Filled 2014-06-26: qty 8

## 2014-06-26 MED ORDER — SODIUM CHLORIDE 0.9 % IJ SOLN
3.0000 mL | Freq: Two times a day (BID) | INTRAMUSCULAR | Status: DC
  Administered 2014-06-27 – 2014-07-01 (×9): 3 mL via INTRAVENOUS

## 2014-06-26 MED ORDER — DIGOXIN 125 MCG PO TABS
0.1250 mg | ORAL_TABLET | Freq: Every day | ORAL | Status: DC
  Administered 2014-06-27: 0.125 mg via ORAL
  Filled 2014-06-26 (×2): qty 1

## 2014-06-26 MED ORDER — CARVEDILOL 6.25 MG PO TABS
6.2500 mg | ORAL_TABLET | Freq: Two times a day (BID) | ORAL | Status: DC
  Administered 2014-06-27 – 2014-07-01 (×8): 6.25 mg via ORAL
  Filled 2014-06-26 (×11): qty 1

## 2014-06-26 MED ORDER — ALBUTEROL SULFATE (2.5 MG/3ML) 0.083% IN NEBU: 3.0000 mL | INHALATION_SOLUTION | Freq: Four times a day (QID) | RESPIRATORY_TRACT | Status: DC | PRN

## 2014-06-26 MED ORDER — IPRATROPIUM BROMIDE 0.02 % IN SOLN
0.5000 mg | Freq: Four times a day (QID) | RESPIRATORY_TRACT | Status: DC
  Administered 2014-06-27 – 2014-06-30 (×15): 0.5 mg via RESPIRATORY_TRACT
  Filled 2014-06-26 (×15): qty 2.5

## 2014-06-26 MED ORDER — WARFARIN SODIUM 2.5 MG PO TABS
2.5000 mg | ORAL_TABLET | ORAL | Status: DC
  Filled 2014-06-26: qty 1

## 2014-06-26 MED ORDER — ALBUTEROL SULFATE (2.5 MG/3ML) 0.083% IN NEBU
2.5000 mg | INHALATION_SOLUTION | Freq: Four times a day (QID) | RESPIRATORY_TRACT | Status: DC
  Administered 2014-06-27 (×2): 2.5 mg via RESPIRATORY_TRACT
  Filled 2014-06-26 (×2): qty 3

## 2014-06-26 MED ORDER — EZETIMIBE-SIMVASTATIN 10-40 MG PO TABS
1.0000 | ORAL_TABLET | Freq: Every day | ORAL | Status: DC
  Administered 2014-06-27 – 2014-06-30 (×4): 1 via ORAL
  Filled 2014-06-26 (×6): qty 1

## 2014-06-26 MED ORDER — ONDANSETRON HCL 4 MG PO TABS: 4.0000 mg | ORAL_TABLET | Freq: Four times a day (QID) | ORAL | Status: DC | PRN

## 2014-06-26 MED ORDER — ONDANSETRON HCL 4 MG/2ML IJ SOLN: 4.0000 mg | Freq: Four times a day (QID) | INTRAMUSCULAR | Status: DC | PRN

## 2014-06-26 MED ORDER — WARFARIN - PHARMACIST DOSING INPATIENT: Freq: Every day | Status: DC

## 2014-06-26 MED ORDER — ACETAMINOPHEN 325 MG PO TABS: 650.0000 mg | ORAL_TABLET | Freq: Four times a day (QID) | ORAL | Status: DC | PRN

## 2014-06-26 MED ORDER — ACETAMINOPHEN 650 MG RE SUPP: 650.0000 mg | Freq: Four times a day (QID) | RECTAL | Status: DC | PRN

## 2014-06-26 MED ORDER — WARFARIN SODIUM 5 MG PO TABS: 5.0000 mg | ORAL_TABLET | ORAL | Status: DC

## 2014-06-26 NOTE — Progress Notes (Addendum)
Pt taken off BiPAP per MD verbal order. Pt is tolerating well on 4 LPM nasal cannula. SPO2 96%. No distress noted. Pt stated he wears 2.5 LPM at home. RT will continue to monitor pt.

## 2014-06-26 NOTE — ED Notes (Signed)
Pt placed on bipap by respiratory tech. Pt tolerating bipap well

## 2014-06-26 NOTE — H&P (Signed)
[facility] History and Physical  Patient: Gabriel Estrada  NOM:[id number]  DOB: [birth year]  DOS: the patient was seen and examined on 06/26/2014 PCP: Walker Kehr, MD  Chief Complaint: shortness of breath  HPI: Gabriel Estrada is a 80 y.o. male with Past medical history of coronary artery disease, peripheral vascular disease, mitral regurgitation status post ring annuloplasty, chronic systolic and diastolic heart failure, chronic kidney disease, hypertension, COPD. Patient presented with complaints of shortness of breath. As per the patient he mentions that he has been having progressively worsening shortness of breath since last 2 weeks. Last night he woke up from sleep with complaints of cough and shortness of breath. When he woke up he continues to have progressively worsening shortness of breath even at rest and family called EMS. When the EMS arrived they found the patient cyanotic and in respiratory distress and placed the patient on CPAP and brought him to the hospital. Initially the patient was on BiPAP in the hospital and gradually improved but continues to have significant tachypnea. He uses 2.5 L of oxygen at his baseline. He mentions he is compliant with all his medication and denies any recent change in his medication.  The patient is coming from home. And at his baseline independent for most of his ADL.  Review of Systems: as mentioned in the history of present illness.  A Comprehensive review of the other systems is negative.  Past Medical History  Diagnosis Date  . Coronary artery disease     a. 02/2001 MI/Cath/CABG x 5: LIMA->LAD, VG->D1, VG->OM2, VG->PDA->LPL & MV Ring (#30 Seguin ring annuloplasty)  . PVD (peripheral vascular disease)   . Hyperlipidemia   . Mitral regurgitation     a. 02/2001 s/p #30 Seguin ring annuloplasty.  . Chronic systolic CHF (congestive heart failure)     a. 05/2013 Echo: appears to be signif overall LV dysfxn, cannot estimate EF.  Ao  sclerosis;  b. 06/2013 Echo: Poor windows, ? septal and apical HK, mild LVH, EF likely low normal  . Hypertension   . CKD (chronic kidney disease), stage III   . Edentulous     all remaining teeth pulled-05-30-13  . A-fib     a. chronic-Dr. Hochrein,cardiology;  b. on coumadin.  [name] Lymphoma     Nasal Mass-left-Dr. Ha,oncology  . Bladder mass 07-03-13  . Carotid artery disease     a. 09/2012 U/S: 0-39% bilat ICA stenosis.  . Hemoptysis 12/25/2013   Past Surgical History  Procedure Laterality Date  . Coronary artery bypass graft  03/15/01    x 5  . Multiple extractions with alveoloplasty N/A 05/30/2013    Procedure: Extraction of tooth #'s 2,6,7,8,10,11,12,18,20,21,22,23,24,25,26,27,28,29 with alveoloplasty and mandibular tori reductions, and lateral exostoses reductions.;  Surgeon: Lenn Cal, DDS;  Location: [place];  Service: Oral Surgery;  Laterality: N/A;  . Soft tissue biopsy       Diffuse B-Cell Lynphoma/ Nasal mucosa, biopsy, left,left nasal mass  . Portacath placement Right     right chest  . Cystoscopy w/ retrogrades Bilateral 07/08/2013    Procedure: CYSTOSCOPY WITH RETROGRADE PYELOGRAM;  Surgeon: Dutch Gray, MD;  Location: [facility];  Service: Urology;  Laterality: Bilateral;  GENERAL ANESTHESIA WITH PARALYSIS, EXAM UNDER ANESTHESIA       Social History:  reports that he quit smoking about 3 years ago. He has never used smokeless tobacco. He reports that he does not drink alcohol or use illicit drugs.  No Known Allergies  Family History  Problem Relation  Age of Onset  . Heart attack Mother   . Heart disease Sister   . Heart attack Brother   . Heart attack Brother   . Heart disease Sister   . Heart disease Sister   . Cancer Father     lung?  . Cancer Maternal Aunt     kidney cancer    Prior to Admission medications   Medication Sig Start Date End Date Taking? Authorizing Provider  carvedilol (COREG) 6.25 MG tablet Take 1 tablet (6.25 mg total) by mouth 2 (two)  times daily with a meal. 06/12/14  Yes Minus Breeding, MD  cholecalciferol (VITAMIN D) 1000 UNITS tablet Take 1 tablet (1,000 Units total) by mouth daily. 01/06/14 01/06/15 Yes Aleksei Plotnikov V, MD  digoxin (LANOXIN) 0.125 MG tablet Take 0.125 mg by mouth daily.   Yes Historical Provider, MD  ezetimibe-simvastatin (VYTORIN) 10-40 MG per tablet Take 1 tablet by mouth at bedtime.   Yes Historical Provider, MD  furosemide (LASIX) 20 MG tablet Take 20 mg by mouth daily.   Yes Historical Provider, MD  levalbuterol [facility] HFA) 45 MCG/ACT inhaler Inhale 1-2 puffs into the lungs every 6 (six) hours as needed for wheezing or shortness of breath. 01/27/14  Yes Barton Dubois, MD  Multiple Vitamin (MULTIVITAMIN WITH MINERALS) TABS tablet Take 1 tablet by mouth daily.   Yes Historical Provider, MD  warfarin (COUMADIN) 5 MG tablet Take 2.5-5 mg by mouth daily at 6 PM. Takes 2.5mg  on Mon, Wed and Fri Takes 5mg  all other days   Yes Historical Provider, MD    Physical Exam: Filed Vitals:   06/26/14 2044 06/26/14 2045 06/26/14 2220 06/26/14 2242  BP: 127/89 119/85 122/96 136/85  Pulse: 94 86 95 96  Temp: 97.8 F (36.6 C)     TempSrc: Axillary     Resp: 22 22 18 18   Height: 6' (1.829 m)     Weight: 72.576 kg (160 lb)     SpO2: 94% 93% 98% 96%    General: Alert, Awake and Oriented to Time, Place and Person. Appear in marked distress Eyes: PERRL ENT: Oral Mucosa clear moist. Neck: minimal JVD Cardiovascular: S1 and S2 Present, aortic Murmur, Peripheral Pulses Present Respiratory: Bilateral Air entry equal and Decreased, bilateral Crackles,no wheezes Abdomen: Bowel Sound Present, Soft and Non tender Skin: no Rash Extremities: bilateral Pedal edema, no calf tenderness Neurologic: Grossly no focal neuro deficit.  Labs on Admission:  CBC:  Recent Labs Lab 06/26/14 2050  WBC 5.8  HGB 13.4  HCT 42.9  MCV 96.4  PLT 149*    CMP     Component Value Date/Time   NA 142 06/26/2014 2050   NA 144  12/24/2013 1221   K 5.0 06/26/2014 2050   K 4.4 12/24/2013 1221   CL 98 06/26/2014 2050   CL 104 05/29/2013 1328   CO2 31 06/26/2014 2050   CO2 21* 12/24/2013 1221   GLUCOSE 123* 06/26/2014 2050   GLUCOSE 89 12/24/2013 1221   GLUCOSE 98 05/29/2013 1328   BUN 55* 06/26/2014 2050   BUN 30.5* 12/24/2013 1221   CREATININE 1.60* 06/26/2014 2050   CREATININE 1.3 12/24/2013 1221   CREATININE 1.92* 08/02/2013 1621   CALCIUM 9.4 06/26/2014 2050   CALCIUM 9.5 12/24/2013 1221   PROT 5.3* 01/24/2014 0340   PROT 6.7 12/24/2013 1221   ALBUMIN 2.8* 01/24/2014 0340   ALBUMIN 3.5 12/24/2013 1221   AST 16 01/24/2014 0340   AST 20 12/24/2013 1221   ALT 12 01/24/2014  0340   ALT 17 12/24/2013 1221   ALKPHOS 63 01/24/2014 0340   ALKPHOS 86 12/24/2013 1221   BILITOT 0.7 01/24/2014 0340   BILITOT 0.95 12/24/2013 1221   GFRNONAA 38* 06/26/2014 2050   GFRAA 44* 06/26/2014 2050    No results found for this basename: LIPASE, AMYLASE,  in the last 168 hours No results found for this basename: AMMONIA,  in the last 168 hours  No results found for this basename: CKTOTAL, CKMB, CKMBINDEX, TROPONINI,  in the last 168 hours BNP (last 3 results)  Recent Labs  01/22/14 1816 01/24/14 0340 06/26/14 2050  PROBNP 4885.0* 5238.0* 12719.0*    Radiological Exams on Admission: Dg Chest Port 1 View  06/26/2014   CLINICAL DATA:  Chest pain  EXAM: PORTABLE CHEST - 1 VIEW  COMPARISON:  01/22/2014  FINDINGS: There is a right chest wall port a catheter with tip in the cavoatrial junction. Moderate cardiac enlargement. Previous median sternotomy and CABG procedure. Fluid is identified within the oblique fissure. There are small bilateral pleural effusions and mild interstitial edema.  IMPRESSION: 1. Mild CHF.   Electronically Signed   By: Kerby Moors M.D.   On: 06/26/2014 21:51    EKG: Independently reviewed. Atrial fibrillation, nonspecific ST and T waves changes.  Assessment/Plan Principal Problem:   Acute on chronic combined systolic  and diastolic CHF (congestive heart failure) Active Problems:   CAD   Atrial fibrillation with RVR   CKD (chronic kidney disease), stage III   DLBCL (diffuse large B cell lymphoma)   Acute respiratory failure with hypoxia   Cardiomyopathy, ischemic   1. Acute on chronic combined systolic and diastolic CHF (congestive heart failure) Patient presented with complaints of shortness of breath. He was found to have increased leg swelling, bilateral crackles with decreased breath sound chest x-ray showing pulmonary vascular congestion and EKG showing atrial fibrillation. Elevated proBNP more than his baseline. With this the patient is admitted to the hospital step down unit for acute on chronic CHF with acute respiratory failure. Next he'll be placed on BiPAP as needed. Patient has received 80 mg of Lasix at present we will follow his urine output and diuresis him as needed. Follow serial troponin limited echocardiogram in the morning. Continue warfarin.  2. Atrial fibrillation Patient is in A. fib but rate controlled at present was initially tachycardic. Continue digoxin and warfarin. Continue Coreg.  3. COPD but Continue home inhaler and nebulizations as needed. And every 4 hours DuoNeb   DVT Prophylaxis: subcutaneous Heparin Nutrition: cardiac diet  Code Status: DNR/DNI  Disposition: Admitted to inpatient in step-down unit.  Author: Berle Mull, MD [facility] Pager: [phone] 06/26/2014, 11:53 PM    If 7PM-7AM, please contact night-coverage [email or web address] Password [email or web address]  **Disclaimer: This note may have been dictated with voice recognition software. Similar sounding words can inadvertently be transcribed and this note may contain transcription errors which may not have been corrected upon publication of note.**

## 2014-06-26 NOTE — Progress Notes (Signed)
ANTICOAGULATION CONSULT NOTE - Initial Consult  Pharmacy Consult for Coumadin Indication: atrial fibrillation  No Known Allergies  Patient Measurements: Height: 6' (182.9 cm) Weight: 160 lb (72.576 kg) IBW/kg (Calculated) : 77.6  Vital Signs: Temp: 97.8 F (36.6 C) (07/02 2044) Temp src: Axillary (07/02 2044) BP: 136/85 mmHg (07/02 2242) Pulse Rate: 96 (07/02 2242)  Labs:  Recent Labs  06/24/14 06/26/14 2050  HGB  --  13.4  HCT  --  42.9  PLT  --  149*  LABPROT  --  29.2*  INR 3.7 2.76*  CREATININE  --  1.60*    Estimated Creatinine Clearance: 35.9 ml/min (by C-G formula based on Cr of 1.6).   Medical History: Past Medical History  Diagnosis Date  . Coronary artery disease     a. 02/2001 MI/Cath/CABG x 5: LIMA->LAD, VG->D1, VG->OM2, VG->PDA->LPL & MV Ring (#30 Seguin ring annuloplasty)  . PVD (peripheral vascular disease)   . Hyperlipidemia   . Mitral regurgitation     a. 02/2001 s/p #30 Seguin ring annuloplasty.  . Chronic systolic CHF (congestive heart failure)     a. 05/2013 Echo: appears to be signif overall LV dysfxn, cannot estimate EF.  Ao sclerosis;  b. 06/2013 Echo: Poor windows, ? septal and apical HK, mild LVH, EF likely low normal  . Hypertension   . CKD (chronic kidney disease), stage III   . Edentulous     all remaining teeth pulled-05-30-13  . A-fib     a. chronic-Dr. Hochrein,cardiology;  b. on coumadin.  [name] Lymphoma     Nasal Mass-left-Dr. Ha,oncology  . Bladder mass 07-03-13  . Carotid artery disease     a. 09/2012 U/S: 0-39% bilat ICA stenosis.  . Hemoptysis 12/25/2013     Assessment: 80yo male c/o SOB x2 weeks, O2 88% on room air, 95% in ED w/ NRB, up to 100% w/ CPAP, admitted for respiratory distress w/ prior h/o hospital admission for CHF, to continue Coumadin for Afib (currently in Afib), admitted w/ therapeutic INR.  Goal of Therapy:  INR 2-3   Plan:  Will continue home Coumadin dose of 2.5mg  MWF and 5mg  TTSS and monitor  INR.  Wynona Neat, PharmD, BCPS  06/26/2014,11:09 PM

## 2014-06-26 NOTE — ED Provider Notes (Signed)
80 year old male with history of CHF has had increasing dyspnea for the last 2 weeks. EMS was called and noted that he was hypoxic and placed him on BiPAP with oxygen saturations increasing to an adequate level. He denies any chest pain. On exam, he is medically distention at 90. Lungs have bibasilar rales. Heart is irregularly irregular and monitor looks like atrial fibrillation. Extremities have 3+ edema. Old records are reviewed and he has a prior hospitalization for CHF and pneumonia. He is anticoagulated for atrial fibrillation.  CRITICAL CARE Performed by: Delora Fuel Total critical care time: 45 minutes Critical care time was exclusive of separately billable procedures and treating other patients. Critical care was necessary to treat or prevent imminent or life-threatening deterioration. Critical care was time spent personally by me on the following activities: development of treatment plan with patient and/or surrogate as well as nursing, discussions with consultants, evaluation of patient's response to treatment, examination of patient, obtaining history from patient or surrogate, ordering and performing treatments and interventions, ordering and review of laboratory studies, ordering and review of radiographic studies, pulse oximetry and re-evaluation of patient's condition.]   I saw and evaluated the patient, reviewed the resident's note and I agree with the findings and plan.   EKG Interpretation   Date/Time:  Thursday June 26 2014 20:43:43 EDT Ventricular Rate:  96 PR Interval:    QRS Duration: 167 QT Interval:  386 QTC Calculation: 488 R Axis:   147 Text Interpretation:  Atrial fibrillation Right bundle branch block ST  depr, consider ischemia, inferior leads When compared with ECG of  01/23/2014, No significant change was found Confirmed by [facility]  MD, Evangeline Utley  ([id number]) on 06/26/2014 9:17:12 PM        Delora Fuel, MD 00/93/81 [id number]

## 2014-06-26 NOTE — ED Notes (Signed)
Per EMS: pt coming form home with c/o shortness of breath for two weeks, increase fluid in lungs, 88% on RA upon EMS arrival, NRB placed O2 saturation increased to 95%, placed on CPAP O2 saturation increased to 100%, pt presents with significant swelling in bilateral legs, nitro x 1 given at 2010, 18g right wrist, pt in A-fib with a hx of A-fib, pt on Coumadin, Pt A&Ox4, respirations equal and unlabored, skin warm and dry

## 2014-06-26 NOTE — ED Provider Notes (Signed)
CSN: [id number]     Arrival date & time 06/26/14  2039 History   First MD Initiated Contact with Patient 06/26/14 2059     Chief Complaint  Patient presents with  . Respiratory Distress     (Consider location/radiation/quality/duration/timing/severity/associated sxs/prior Treatment) Patient is a 80 y.o. male presenting with shortness of breath.  Shortness of Breath Severity:  Severe Onset quality:  Gradual Duration:  2 weeks Timing:  Constant Progression:  Worsening Chronicity:  New Context: not activity, not animal exposure and not emotional upset   Relieved by:  None tried Associated symptoms: no abdominal pain, no chest pain, no neck pain and no vomiting       Past Medical History  Diagnosis Date  . Coronary artery disease     a. 02/2001 MI/Cath/CABG x 5: LIMA->LAD, VG->D1, VG->OM2, VG->PDA->LPL & MV Ring (#30 Seguin ring annuloplasty)  . PVD (peripheral vascular disease)   . Hyperlipidemia   . Mitral regurgitation     a. 02/2001 s/p #30 Seguin ring annuloplasty.  . Chronic systolic CHF (congestive heart failure)     a. 05/2013 Echo: appears to be signif overall LV dysfxn, cannot estimate EF.  Ao sclerosis;  b. 06/2013 Echo: Poor windows, ? septal and apical HK, mild LVH, EF likely low normal  . Hypertension   . CKD (chronic kidney disease), stage III   . Edentulous     all remaining teeth pulled-05-30-13  . A-fib     a. chronic-Dr. Hochrein,cardiology;  b. on coumadin.  [name] Lymphoma     Nasal Mass-left-Dr. Ha,oncology  . Bladder mass 07-03-13  . Carotid artery disease     a. 09/2012 U/S: 0-39% bilat ICA stenosis.  . Hemoptysis 12/25/2013   Past Surgical History  Procedure Laterality Date  . Coronary artery bypass graft  03/15/01    x 5  . Multiple extractions with alveoloplasty N/A 05/30/2013    Procedure: Extraction of tooth #'s 2,6,7,8,10,11,12,18,20,21,22,23,24,25,26,27,28,29 with alveoloplasty and mandibular tori reductions, and lateral exostoses reductions.;  Surgeon:  Lenn Cal, DDS;  Location: [place];  Service: Oral Surgery;  Laterality: N/A;  . Soft tissue biopsy       Diffuse B-Cell Lynphoma/ Nasal mucosa, biopsy, left,left nasal mass  . Portacath placement Right     right chest  . Cystoscopy w/ retrogrades Bilateral 07/08/2013    Procedure: CYSTOSCOPY WITH RETROGRADE PYELOGRAM;  Surgeon: Dutch Gray, MD;  Location: [facility];  Service: Urology;  Laterality: Bilateral;  GENERAL ANESTHESIA WITH PARALYSIS, EXAM UNDER ANESTHESIA       Family History  Problem Relation Age of Onset  . Heart attack Mother   . Heart disease Sister   . Heart attack Brother   . Heart attack Brother   . Heart disease Sister   . Heart disease Sister   . Cancer Father     lung?  . Cancer Maternal Aunt     kidney cancer   History  Substance Use Topics  . Smoking status: Former Smoker -- 1.00 packs/day for 20 years    Quit date: 12/26/2010  . Smokeless tobacco: Never Used  . Alcohol Use: No     Comment: Used to drink beer, but not anymore.    Review of Systems  Unable to perform ROS: Acuity of condition  Respiratory: Positive for shortness of breath.   Cardiovascular: Negative for chest pain.  Gastrointestinal: Negative for vomiting and abdominal pain.  Musculoskeletal: Negative for neck pain.      Allergies  Review of patient's allergies  indicates no known allergies.  Home Medications   Prior to Admission medications   Medication Sig Start Date End Date Taking? Authorizing Provider  carvedilol (COREG) 6.25 MG tablet Take 1 tablet (6.25 mg total) by mouth 2 (two) times daily with a meal. 06/12/14  Yes Minus Breeding, MD  cholecalciferol (VITAMIN D) 1000 UNITS tablet Take 1 tablet (1,000 Units total) by mouth daily. 01/06/14 01/06/15 Yes Aleksei Plotnikov V, MD  digoxin (LANOXIN) 0.125 MG tablet Take 0.125 mg by mouth daily.   Yes Historical Provider, MD  ezetimibe-simvastatin (VYTORIN) 10-40 MG per tablet Take 1 tablet by mouth at bedtime.   Yes  Historical Provider, MD  furosemide (LASIX) 20 MG tablet Take 20 mg by mouth daily.   Yes Historical Provider, MD  levalbuterol [facility] HFA) 45 MCG/ACT inhaler Inhale 1-2 puffs into the lungs every 6 (six) hours as needed for wheezing or shortness of breath. 01/27/14  Yes Barton Dubois, MD  Multiple Vitamin (MULTIVITAMIN WITH MINERALS) TABS tablet Take 1 tablet by mouth daily.   Yes Historical Provider, MD  warfarin (COUMADIN) 5 MG tablet Take 2.5-5 mg by mouth daily at 6 PM. Takes 2.5mg  on Mon, Wed and Fri Takes 5mg  all other days   Yes Historical Provider, MD   BP 136/85  Pulse 96  Temp(Src) 97.8 F (36.6 C) (Axillary)  Resp 18  Ht 6' (1.829 m)  Wt 160 lb (72.576 kg)  BMI 21.70 kg/m2  SpO2 96% Physical Exam  Constitutional: He is oriented to person, place, and time. He appears well-developed and well-nourished. No distress.  HENT:  Head: Normocephalic and atraumatic.  Eyes: Pupils are equal, round, and reactive to light.  Neck: Normal range of motion.  Cardiovascular: Normal rate and regular rhythm.   Pulmonary/Chest: Effort normal. He has decreased breath sounds. He has rhonchi. He has rales.  Abdominal: Soft. He exhibits no distension. There is no tenderness.  Musculoskeletal: Normal range of motion.       Right lower leg: He exhibits edema.       Left lower leg: He exhibits edema.  Neurological: He is alert and oriented to person, place, and time.  Skin: Skin is warm. He is not diaphoretic.    ED Course  Procedures (including critical care time) Labs Review Labs Reviewed  CBC - Abnormal; Notable for the following:    RDW 16.1 (*)    Platelets 149 (*)    All other components within normal limits  BASIC METABOLIC PANEL - Abnormal; Notable for the following:    Glucose, Bld 123 (*)    BUN 55 (*)    Creatinine, Ser 1.60 (*)    GFR calc non Af Amer 38 (*)    GFR calc Af Amer 44 (*)    All other components within normal limits  PRO B NATRIURETIC PEPTIDE - Abnormal;  Notable for the following:    Pro B Natriuretic peptide (BNP) 12719.0 (*)    All other components within normal limits  PROTIME-INR - Abnormal; Notable for the following:    Prothrombin Time 29.2 (*)    INR 2.76 (*)    All other components within normal limits  I-STAT ARTERIAL BLOOD GAS, ED - Abnormal; Notable for the following:    pCO2 arterial 55.1 (*)    pO2, Arterial 56.0 (*)    Bicarbonate 31.7 (*)    Acid-Base Excess 5.0 (*)    All other components within normal limits  BLOOD GAS, ARTERIAL  TROPONIN I  TROPONIN I  TROPONIN  I  TSH  COMPREHENSIVE METABOLIC PANEL  CBC  PROTIME-INR  I-STAT TROPOININ, ED    Imaging Review Dg Chest Port 1 View  06/26/2014   CLINICAL DATA:  Chest pain  EXAM: PORTABLE CHEST - 1 VIEW  COMPARISON:  01/22/2014  FINDINGS: There is a right chest wall port a catheter with tip in the cavoatrial junction. Moderate cardiac enlargement. Previous median sternotomy and CABG procedure. Fluid is identified within the oblique fissure. There are small bilateral pleural effusions and mild interstitial edema.  IMPRESSION: 1. Mild CHF.   Electronically Signed   By: Kerby Moors M.D.   On: 06/26/2014 21:51     EKG Interpretation   Date/Time:  Thursday June 26 2014 20:43:43 EDT Ventricular Rate:  96 PR Interval:    QRS Duration: 167 QT Interval:  386 QTC Calculation: 488 R Axis:   147 Text Interpretation:  Atrial fibrillation Right bundle branch block ST  depr, consider ischemia, inferior leads When compared with ECG of  01/23/2014, No significant change was found Confirmed by [facility]  MD, DAVID  ([id number]) on 06/26/2014 9:17:12 PM      MDM   Final diagnoses:  Acute on chronic combined systolic and diastolic CHF (congestive heart failure)  Acute respiratory failure with hypoxia  Cardiomyopathy, ischemic  CKD (chronic kidney disease), stage III  DLBCL (diffuse large B cell lymphoma)  Peripheral vascular disease, unspecified   80 yo M with hx of COPD, CHF  presents for worsening SOB x2 weeks.   Patient brought in by EMS, on BiPAP 2/2 bluish discoloration of face upon arrival. Patient comfortable appearing on BiPAP. Patient with BLE pitting edema, rales, c/w CHF exacerbation. BNP elevated. Given 80 mg IV lasix. Will admit to stepdown unit. Attempted trial off BiPAP, patient became more tachypneic and uncomfortable. Will continue on BiPAP. Admitted to stepdown unit for CHF management. Patient seen and evaluated by myself and my attending, Dr. Roxanne Mins.      Freddi Che, MD 06/27/14 [phone]

## 2014-06-26 NOTE — ED Notes (Signed)
Admitting doctor at bedside 

## 2014-06-27 ENCOUNTER — Inpatient Hospital Stay (HOSPITAL_COMMUNITY)

## 2014-06-27 ENCOUNTER — Encounter (HOSPITAL_COMMUNITY): Payer: Self-pay | Admitting: *Deleted

## 2014-06-27 DIAGNOSIS — I4891 Unspecified atrial fibrillation: Secondary | ICD-10-CM

## 2014-06-27 DIAGNOSIS — J449 Chronic obstructive pulmonary disease, unspecified: Secondary | ICD-10-CM

## 2014-06-27 DIAGNOSIS — I5023 Acute on chronic systolic (congestive) heart failure: Secondary | ICD-10-CM

## 2014-06-27 LAB — COMPREHENSIVE METABOLIC PANEL
ALBUMIN: 3.5 g/dL (ref 3.5–5.2)
ALT: 26 U/L (ref 0–53)
ANION GAP: 13 (ref 5–15)
AST: 30 U/L (ref 0–37)
Alkaline Phosphatase: 111 U/L (ref 39–117)
BUN: 55 mg/dL — AB (ref 6–23)
CALCIUM: 9.1 mg/dL (ref 8.4–10.5)
CO2: 31 mEq/L (ref 19–32)
Chloride: 99 mEq/L (ref 96–112)
Creatinine, Ser: 1.64 mg/dL — ABNORMAL HIGH (ref 0.50–1.35)
GFR calc non Af Amer: 37 mL/min — ABNORMAL LOW (ref >90)
GFR, EST AFRICAN AMERICAN: 43 mL/min — AB (ref >90)
GLUCOSE: 102 mg/dL — AB (ref 70–99)
Potassium: 5 mEq/L (ref 3.7–5.3)
Sodium: 143 mEq/L (ref 137–147)
TOTAL PROTEIN: 6.4 g/dL (ref 6.0–8.3)
Total Bilirubin: 1.3 mg/dL — ABNORMAL HIGH (ref 0.3–1.2)

## 2014-06-27 LAB — TROPONIN I

## 2014-06-27 LAB — CBC
HCT: 40.7 % (ref 39.0–52.0)
HEMOGLOBIN: 12.7 g/dL — AB (ref 13.0–17.0)
MCH: 30 pg (ref 26.0–34.0)
MCHC: 31.2 g/dL (ref 30.0–36.0)
MCV: 96 fL (ref 78.0–100.0)
Platelets: 142 10*3/uL — ABNORMAL LOW (ref 150–400)
RBC: 4.24 MIL/uL (ref 4.22–5.81)
RDW: 16 % — ABNORMAL HIGH (ref 11.5–15.5)
WBC: 7.8 10*3/uL (ref 4.0–10.5)

## 2014-06-27 LAB — MRSA PCR SCREENING: MRSA BY PCR: NEGATIVE

## 2014-06-27 LAB — PROTIME-INR
INR: 2.57 — AB (ref 0.00–1.49)
Prothrombin Time: 27.6 seconds — ABNORMAL HIGH (ref 11.6–15.2)

## 2014-06-27 LAB — TSH: TSH: 4.36 u[IU]/mL (ref 0.350–4.500)

## 2014-06-27 MED ORDER — FUROSEMIDE 10 MG/ML IJ SOLN
80.0000 mg | Freq: Two times a day (BID) | INTRAMUSCULAR | Status: DC
  Administered 2014-06-27 – 2014-06-29 (×4): 80 mg via INTRAVENOUS
  Filled 2014-06-27 (×6): qty 8

## 2014-06-27 MED ORDER — FUROSEMIDE 10 MG/ML IJ SOLN
40.0000 mg | Freq: Three times a day (TID) | INTRAMUSCULAR | Status: DC
  Administered 2014-06-27: 40 mg via INTRAVENOUS
  Filled 2014-06-27: qty 4

## 2014-06-27 MED ORDER — LEVALBUTEROL HCL 0.63 MG/3ML IN NEBU: 0.6300 mg | INHALATION_SOLUTION | Freq: Four times a day (QID) | RESPIRATORY_TRACT | Status: DC | PRN

## 2014-06-27 MED ORDER — CHLORHEXIDINE GLUCONATE 0.12 % MT SOLN
15.0000 mL | Freq: Two times a day (BID) | OROMUCOSAL | Status: DC
  Administered 2014-06-27 – 2014-07-01 (×8): 15 mL via OROMUCOSAL
  Filled 2014-06-27 (×9): qty 15

## 2014-06-27 NOTE — Progress Notes (Signed)
Pt arrived to 2H16 from ED via stretcher; A/O x 4, on 6 LPM o2 via [place]; oriented to unit/room/fall risk precautions; call bell within reach; condom cath on for accurate output monitoring d/t urgency incontinence and spilling urinal... Blair Hailey, RN

## 2014-06-27 NOTE — Progress Notes (Signed)
[facility] Progress Note   [name]:[id number] DOB: 07/03/1928 DOA: 06/26/2014 PCP: Walker Kehr, MD  Brief narrative: Gabriel Estrada is a 80 y.o. male  presents with a systolic and diastolic CHF exacerbation. He states his ankles have been swollen for 6 wks now. He has not gone to see his PCP in regards to this. He is taking his Lasix which was started by Dr Percival Spanish 68mo ago. Notes in EPIC reveal he is followed by hospice nurse at home. Last admitted in 2/14 for HCAP and CHF exacerbation   Subjective: Dyspnea is improving. No cough. Poor historian  Assessment/Plan: Principal Problem:   Acute on chronic combined systolic and diastolic CHF --  Acute respiratory failure with hypoxia - weaned off of BiPAP to [place] - given 80 mg of Lasix in ER- will resume it at 40 TID - pulse ox about 90 on 4 L currently - follow K and not replace as 5.0  - consult cardiology  Active Problems:    Atrial fibrillation with RVR - rate controlled on Dig, Coreg - cont Coumadin per pharmacy    CKD (chronic kidney disease), stage III - follow with diuresis    DLBCL (diffuse large B cell lymphoma) - follows with oncology- has a port- noted to havecancelled his last appt on 4/22 (per EPIC) - has been receiving radiation treatments- completed in 1/15- was supposed to f/u in 1 mo  COPD - - on O2 at home ordered by Dr Alain Marion- he is not sure of how much    Cardiomyopathy, ischemic - per cardiology    Code Status: DNR Family Communication: none Disposition Plan: to be determined  Consultants: cardiology  Procedures: none  Antibiotics: Anti-infectives   None       DVT prophylaxis: On Coumadin  Objective: [facility] Weights   06/26/14 2044 06/27/14 0100  Weight: 72.576 kg (160 lb) 77.8 kg (171 lb 8.3 oz)    Vitals Filed Vitals:   06/27/14 0258 06/27/14 0400 06/27/14 0738 06/27/14 0800  BP:  93/42    Pulse: 76 81    Temp:  98.2 F (36.8 C)  98.1 F (36.7 C)   TempSrc:  Oral  Oral  Resp: 19 18    Height:      Weight:      SpO2: 93% 100% 95%       Intake/Output Summary (Last 24 hours) at 06/27/14 0855 Last data filed at 06/27/14 0500  Gross per 24 hour  Intake      0 ml  Output    350 ml  Net   -350 ml     Exam: General: No acute respiratory distress Lungs:crackles upto mid lung fields, 4 L O2- pulse ox 905 Cardiovascular: IIRR, without murmur gallop or rub normal S1 and S2 Abdomen: Nontender, nondistended, soft, bowel sounds positive, no rebound, no ascites, no appreciable mass Extremities: No significant cyanosis, clubbing- 2 + pitting edema bilateral lower extremities  Data Reviewed: Basic Metabolic Panel:  Recent Labs Lab 06/26/14 2050 06/27/14 0142  NA 142 143  K 5.0 5.0  CL 98 99  CO2 31 31  GLUCOSE 123* 102*  BUN 55* 55*  CREATININE 1.60* 1.64*  CALCIUM 9.4 9.1   Liver Function Tests:  Recent Labs Lab 06/27/14 0142  AST 30  ALT 26  ALKPHOS 111  BILITOT 1.3*  PROT 6.4  ALBUMIN 3.5   No results found for this basename: LIPASE, AMYLASE,  in the last 168 hours No results found for this basename: AMMONIA,  in the last 168 hours CBC:  Recent Labs Lab 06/26/14 2050 06/27/14 0142  WBC 5.8 7.8  HGB 13.4 12.7*  HCT 42.9 40.7  MCV 96.4 96.0  PLT 149* 142*   Cardiac Enzymes:  Recent Labs Lab 06/27/14 0142 06/27/14 0328  TROPONINI <0.30 <0.30   BNP (last 3 results)  Recent Labs  01/22/14 1816 01/24/14 0340 06/26/14 2050  PROBNP 4885.0* 5238.0* 12719.0*   CBG: No results found for this basename: GLUCAP,  in the last 168 hours  Recent Results (from the past 240 hour(s))  MRSA PCR SCREENING     Status: None   Collection Time    06/27/14 12:31 AM      Result Value Ref Range Status   MRSA by PCR NEGATIVE  NEGATIVE Final   Comment:            The GeneXpert MRSA Assay (FDA     approved for NASAL specimens     only), is one component of a     comprehensive MRSA colonization      surveillance program. It is not     intended to diagnose MRSA     infection nor to guide or     monitor treatment for     MRSA infections.     Studies:  Recent x-ray studies have been reviewed in detail by the Attending Physician  Scheduled Meds:  Scheduled Meds: . albuterol  2.5 mg Nebulization Q6H  . carvedilol  6.25 mg Oral BID WC  . chlorhexidine  15 mL Mouth/Throat BID  . digoxin  0.125 mg Oral Daily  . ezetimibe-simvastatin  1 tablet Oral QHS  . furosemide  40 mg Intravenous 3 times per day  . ipratropium  0.5 mg Nebulization Q6H  . sodium chloride  3 mL Intravenous Q12H  . warfarin  2.5 mg Oral Q M,W,F-1800  . [START ON 06/28/2014] warfarin  5 mg Oral Q T,Th,S,Su-1800  . Warfarin - Pharmacist Dosing Inpatient   Does not apply q1800   Continuous Infusions:   Time spent on care of this patient: > 35 min   [place], MD 06/27/2014, 8:55 AM  LOS: 1 day   [facility] Office  [phone] Pager - Text Page per Shea Evans   If 7PM-7AM, please contact night-coverage [email or web address]

## 2014-06-27 NOTE — Progress Notes (Signed)
Utilization Review Completed.Donne Anon T7/02/2014

## 2014-06-27 NOTE — Care Management Note (Addendum)
  Page 2 of 2   07/01/2014     12:14:03 PM CARE MANAGEMENT NOTE 07/01/2014  Patient:  Gabriel Estrada, Gabriel Estrada   Account Number:  [id number]  Date Initiated:  06/27/2014  Documentation initiated by:  Elissa Hefty  Subjective/Objective Assessment:   adm w heart failure     Action/Plan:   lives lives alone, [facility], pcp dr [employer]   Anticipated DC Date:     Anticipated DC Plan:  [place] referral  Clinical Social Worker      DC Forensic scientist  CM consult      [facility] Choice  Resumption Of Svcs/PTA Provider   Choice offered to / List presented to:  NA        HH arranged  HH-1 RN  [place] agency  [facility] [place]   Status of service:  Completed, signed off Medicare Important Message given?   (If response is "NO", the following Medicare IM given date fields will be blank) Date Medicare IM given:   Medicare IM given by:   Date Additional Medicare IM given:   Additional Medicare IM given by:    Discharge Disposition:  [place]  Per UR Regulation:  Reviewed for med. necessity/level of care/duration of stay  If discussed at [place] of Stay Meetings, dates discussed:    Comments:  Sadik Piascik RN, BSN, MSHL, CCM  Nurse - Case Manager,  (Unit [facility])  [phone]  07/01/2014 Initial IM 06/29/14 provided by admissions. Transfer from [facility] Unit Social:  From home/alone.  Hx/o was living with sister but sister recently placed in SNF [facility]) PT RECS:  SNF  (Patient refuses SNF and elects to d/c home to continue home Hospice care Dispositon Plan:  Home / [facility] [place]  - Resume    7/3 1348 debbie dowell rn,bsn karen from [facility] and [facility] came by. pt act w hospice. he lives alone. fam concerned about him being alone. will make sw ref in case needed. pt's hosp sw is melanie [phone]. sister hazel Rich Reining [phone]. will follow and assist as pt  progresses.

## 2014-06-27 NOTE — Progress Notes (Signed)
**Note De-Identified Stein Windhorst Obfuscation** RT note: patient removed from Bipap and placed on 5L [place].  Patient tolerating well.  RT to continue to monitor.

## 2014-06-27 NOTE — Progress Notes (Addendum)
ANTICOAGULATION CONSULT NOTE - Follow Up Consult  Pharmacy Consult for Coumadin>>change to prophylactic lovenox Indication: atrial fibrillation  No Known Allergies  Patient Measurements: Height: 6' (182.9 cm) Weight: 171 lb 8.3 oz (77.8 kg) IBW/kg (Calculated) : 77.6  Vital Signs: Temp: 98.1 F (36.7 C) (07/03 0800) Temp src: Oral (07/03 0800) BP: 93/42 mmHg (07/03 0400) Pulse Rate: 81 (07/03 0400)  Labs:  Recent Labs  06/26/14 2050 06/27/14 0142 06/27/14 0328  HGB 13.4 12.7*  --   HCT 42.9 40.7  --   PLT 149* 142*  --   LABPROT 29.2* 27.6*  --   INR 2.76* 2.57*  --   CREATININE 1.60* 1.64*  --   TROPONINI  --  <0.30 <0.30    Estimated Creatinine Clearance: 37.5 ml/min (by C-G formula based on Cr of 1.64).  Assessment: 85yom admitted with CHF exacerbation continues on home coumadin for afib. INR is therapeutic on his home dose of 5mg  TTSS, 2.5mg  MWF. CBC stable. No bleeding reported.  Goal of Therapy:  INR 2-3 Monitor platelets by anticoagulation protocol: Yes   Plan:  1) Coumadin 2.5mg  tonight per home regimen 2) INR in AM  Deboraha Sprang 06/27/2014,10:30 AM  Addendum:  Cardiology has discontinued coumadin due to recent falls and dementia. Orders received to start DVT prophylaxis with lovenox. Will wait to start until INR < 2, currently 2.57.  Deboraha Sprang 06/27/2014, 1:22PM

## 2014-06-27 NOTE — Progress Notes (Signed)
Condom cath fell off x 2; unable to measure accurate urine output d/t urgency incontinence and incontinence while asleep... Blair Hailey, RN

## 2014-06-27 NOTE — Consult Note (Signed)
CARDIOLOGY CONSULT NOTE   Patient ID: Gabriel Estrada MRN: [id number] DOB/AGE: 04/13/1928 80 y.o.  Admit Date: 06/26/2014 Referring Physician:[facility] Primary Physician: Walker Kehr, MD Consulting Cardiologist: Dorris Carnes MD Primary Cardiologist Minus Breeding MD Reason for Consultation:CHF  Clinical Summary [name] is a 80 y.o.male with known history of CAD, PAD, mitral regurgitation s/p annuloplasty, mixed CHF,hypertension, with O2 dependent COPD, admitted with respiratory distress, cyanosis, and placed on BiPAP. He  states the symptoms began approximately 4 days ago when he fell in the bathroom. He states that he was not wearing his oxygen, passed out, and injured his hip. Since that time, the patient has been having worsening shortness of breath. Patient states that he did lose consciousness wall in the bathroom, he was not defecating. He also noticed lower extremity edema and worsening PND. The patient admits to dietary noncompliance eating crackers and other salty foods at home. He is adamant about not adding salt to his foods. He is medically compliant. Stating he is taking his medications every day. Of note, he has a sister who looks after him, but his sisters also hospitalized right now. Pt called EMS. He is a difficult historian and history is obtained from current and past medical records.   On arrival to the emergency room the patient's blood pressure was 127/89, pulse 94, O2 sat 94%. Glucose is 123, BUN 55, creatinine 1.60. Pro BNP 12,719. INR 2.76 left knee was x-rayed revealing no fractures possible small knee joint effusion,  revealed mild CHF. EKG atrial fib with RBBB, with rate of 96 bpm He was provided with Lasix 80 mg x1 and also given Vytorin.  There is a phone note stating that a Hospice nurse, Arbie Cookey, contacted our office for refills on home medications to keep patient from having to come to the office on 6.3.2014. Meds were refilled. Currently, he is being treated with  lasix, 40 mg IV BID, and continued on 6.25 mg BID and digoxin. He has has only minimal urine output if recordings   No Known Allergies  Medications Scheduled Medications: . carvedilol  6.25 mg Oral BID WC  . chlorhexidine  15 mL Mouth/Throat BID  . digoxin  0.125 mg Oral Daily  . ezetimibe-simvastatin  1 tablet Oral QHS  . furosemide  40 mg Intravenous 3 times per day  . ipratropium  0.5 mg Nebulization Q6H  . sodium chloride  3 mL Intravenous Q12H  . warfarin  2.5 mg Oral Q M,W,F-1800  . [START ON 06/28/2014] warfarin  5 mg Oral Q T,Th,S,Su-1800  . Warfarin - Pharmacist Dosing Inpatient   Does not apply q1800      PRN Medications: acetaminophen, acetaminophen, levalbuterol, ondansetron (ZOFRAN) IV, ondansetron   Past Medical History  Diagnosis Date  . Coronary artery disease     a. 02/2001 MI/Cath/CABG x 5: LIMA->LAD, VG->D1, VG->OM2, VG->PDA->LPL & MV Ring (#30 Seguin ring annuloplasty)  . PVD (peripheral vascular disease)   . Hyperlipidemia   . Mitral regurgitation     a. 02/2001 s/p #30 Seguin ring annuloplasty.  . Chronic systolic CHF (congestive heart failure)     a. 05/2013 Echo: appears to be signif overall LV dysfxn, cannot estimate EF.  Ao sclerosis;  b. 06/2013 Echo: Poor windows, ? septal and apical HK, mild LVH, EF likely low normal  . Hypertension   . CKD (chronic kidney disease), stage III   . Edentulous     all remaining teeth pulled-05-30-13  . A-fib     a. chronic-Dr.  Hochrein,cardiology;  b. on coumadin.  [name] Lymphoma     Nasal Mass-left-Dr. Ha,oncology  . Bladder mass 07-03-13  . Carotid artery disease     a. 09/2012 U/S: 0-39% bilat ICA stenosis.  . Hemoptysis 12/25/2013    Past Surgical History  Procedure Laterality Date  . Coronary artery bypass graft  03/15/01    x 5  . Multiple extractions with alveoloplasty N/A 05/30/2013    Procedure: Extraction of tooth #'s 2,6,7,8,10,11,12,18,20,21,22,23,24,25,26,27,28,29 with alveoloplasty and mandibular tori  reductions, and lateral exostoses reductions.;  Surgeon: Lenn Cal, DDS;  Location: [facility];  Service: Oral Surgery;  Laterality: N/A;  . Soft tissue biopsy       Diffuse B-Cell Lynphoma/ Nasal mucosa, biopsy, left,left nasal mass  . Portacath placement Right     right chest  . Cystoscopy w/ retrogrades Bilateral 07/08/2013    Procedure: CYSTOSCOPY WITH RETROGRADE PYELOGRAM;  Surgeon: Dutch Gray, MD;  Location: [facility];  Service: Urology;  Laterality: Bilateral;  GENERAL ANESTHESIA WITH PARALYSIS, EXAM UNDER ANESTHESIA        Family History  Problem Relation Age of Onset  . Heart attack Mother   . Heart disease Sister   . Heart attack Brother   . Heart attack Brother   . Heart disease Sister   . Heart disease Sister   . Cancer Father     lung?  . Cancer Maternal Aunt     kidney cancer    Social History Mr. Misiaszek reports that he quit smoking about 3 years ago. He has never used smokeless tobacco. Mr. Nienhuis reports that he does not drink alcohol.  Review of Systems Otherwise reviewed and negative except as outlined.  Physical Examination Blood pressure 93/42, pulse 81, temperature 98.1 F (36.7 C), temperature source Oral, resp. rate 18, height 6' (1.829 m), weight 171 lb 8.3 oz (77.8 kg), SpO2 95.00%.  Intake/Output Summary (Last 24 hours) at 06/27/14 1144 Last data filed at 06/27/14 0900  Gross per 24 hour  Intake    250 ml  Output    850 ml  Net   -600 ml    Telemetry:Atrial fib with RBBB  GEN: No acute distress, still mildly dyspnec HEENT: Conjunctiva and lids normal, oropharynx clear with moist mucosa. Neck: Supple, no elevated JVP or carotid bruits, no thyromegaly. Lungs: Clear to auscultation, nonlabored breathing at rest. Cardiac: Regular rate and rhythm, no S3 or significant systolic murmur, no pericardial rub. Abdomen: Soft, nontender, no hepatomegaly, bowel sounds present, no guarding or rebound. Extremities: No pitting edema, distal pulses  2+. Skin: Warm and dry. Musculoskeletal: No kyphosis. Neuropsychiatric: Alert and confused.  Prior Cardiac Testing/Procedures 1. Echocardiogram 01/23/2014 Left ventricle: The cavity size was mildly dilated. Wall thickness was normal. Systolic function was moderately reduced. The estimated ejection fraction was in the range of 35% to 40%. Severe hypokinesis of the entireinferolateral and inferior myocardium. - Mitral valve: Mild regurgitation. - Right ventricle: Systolic function was mildly reduced. - Right atrium: The atrium was mildly dilated. - Pulmonary arteries: Systolic pressure was moderately increased. PA peak pressure: 81mm Hg (S).  2. Cardiac Cath 02/2001 MI/Cath/CABG x 5: LIMA->LAD, VG->D1, VG->OM2, VG->PDA->LPL & MV Ring (#30 Seguin ring annuloplasty  Lab Results  Basic Metabolic Panel:  Recent Labs Lab 06/26/14 2050 06/27/14 0142  NA 142 143  K 5.0 5.0  CL 98 99  CO2 31 31  GLUCOSE 123* 102*  BUN 55* 55*  CREATININE 1.60* 1.64*  CALCIUM 9.4 9.1    Liver Function  Tests:  Recent Labs Lab 06/27/14 0142  AST 30  ALT 26  ALKPHOS 111  BILITOT 1.3*  PROT 6.4  ALBUMIN 3.5    CBC:  Recent Labs Lab 06/26/14 2050 06/27/14 0142  WBC 5.8 7.8  HGB 13.4 12.7*  HCT 42.9 40.7  MCV 96.4 96.0  PLT 149* 142*    Cardiac Enzymes:  Recent Labs Lab 06/27/14 0142 06/27/14 0328 06/27/14 1031  TROPONINI <0.30 <0.30 <0.30    Radiology: Dg Chest Port 1 View  06/26/2014   CLINICAL DATA:  Chest pain  EXAM: PORTABLE CHEST - 1 VIEW  COMPARISON:  01/22/2014  FINDINGS: There is a right chest wall port a catheter with tip in the cavoatrial junction. Moderate cardiac enlargement. Previous median sternotomy and CABG procedure. Fluid is identified within the oblique fissure. There are small bilateral pleural effusions and mild interstitial edema.  IMPRESSION: 1. Mild CHF.   Electronically Signed   By: Kerby Moors M.D.   On: 06/26/2014 21:51   Dg Knee Left  Port  06/27/2014   CLINICAL DATA:  Fall with knee pain  EXAM: PORTABLE LEFT KNEE - 1-2 VIEW  COMPARISON:  None.  FINDINGS: There may be a small knee joint effusion. No convincing fracture, no dislocation. No significant degenerative change for age. There are numerous vascular clips in the medial thigh and calf which may be related to arterial bypass or venous harvesting.  IMPRESSION: No acute osseous findings.  Possible small knee joint effusion.   Electronically Signed   By: Jorje Guild M.D.   On: 06/27/2014 06:24     ECG: Atrial fib with RBBB rate of 94 bpm.    Impression and Recommendations  1.Acute on Chronic Systolic CHF: EF of 73%-53%, with only mild diureses from IV lasix. He is breathing sone better, on O2, 2/L. Continues LEE and diminished breath sounds. Would continue lasix but increase to Q 8 hrs for better diureses. Home dose is  20 mg daily, but recent note from [place] has increased dose to 40 mg BID. Follow BMET. Will not repeat echo.   2. Atrial Fib: Heart rate is controlled currently on digoxin and coreg. Would discontinue anticoagulation therapy in the setting of recent falls and dementia. DVT prophylaxis only.   3. COPD: Endstage, O2 Dependent.  Hospice patient.   Signed: Phill Myron. Lawrence NP  06/27/2014, 11:44 AM  Patinet seen and examined  I agree with findings of K Lawrence  I have amended note to reflect my findings  He is a very poor historian  He supposedly lives with sister who is now in hospital. Patinet reports passing out in [place] for about 15 min.  That is when breathing started getting worse.  Alos got increased LE edema.  He eats at home  Cooks his own food.   Hw was admitted yesterday  And has received IV lasix  He still has evid of increased volume on exam.  Rates are controlled on meds.  I do not think he is a good candidate for anticoagulaton given mental status.    Would continue with IV diuresis  I would recomm changing lasix to 80 IV bid  Follow labs.     Would hold off on echo  I am not sure how much this will change his management  We know it is down.  At least hold off until optimized.    Dorris Carnes

## 2014-06-27 NOTE — Progress Notes (Addendum)
Inpatient RN visit-Hersel Neyhart [facility] 2H  Room 16 -[facility] RN Visit-Karen Alford [place] RN  Related admission to [facility] diagnosis of CHF.  Pt is DNR code.  Notified by [facility] SW Charyl Bigger that pt's sister had left a vm for her reporting that pt was at [facility]. Pt seen at bedside, lying in bed, alert and oriented to self, surroundings and situation. Denied pain, denied any needs. Lunch tray at bedside, pt appears to have eaten 50% of his lunch. Pt did not want to engage with this RN. Writer asked him what led him to come to the hospital, he said "I fell about about a week and a half ago, and I was just feeling bad". Pt called EMS himself, did not notify [facility]. He did not know of any discharge plans at this time. Writer spoke with RNCM Debbie to advise that pt is currently active with [facility] No family present at time of visit.  [facility] will continue to follow through discharge. Patient's home medication list and transfer summary in place on shadow chart.   Please call [facility] @ [phone]  with any hospice needs.   Thank you. Tracey Harries, RN  [facility]  Hospice Liaison  [phone])

## 2014-06-28 LAB — BASIC METABOLIC PANEL
Anion gap: 13 (ref 5–15)
BUN: 58 mg/dL — AB (ref 6–23)
CALCIUM: 8.6 mg/dL (ref 8.4–10.5)
CO2: 33 mEq/L — ABNORMAL HIGH (ref 19–32)
Chloride: 99 mEq/L (ref 96–112)
Creatinine, Ser: 1.91 mg/dL — ABNORMAL HIGH (ref 0.50–1.35)
GFR calc Af Amer: 36 mL/min — ABNORMAL LOW (ref >90)
GFR, EST NON AFRICAN AMERICAN: 31 mL/min — AB (ref >90)
GLUCOSE: 95 mg/dL (ref 70–99)
Potassium: 4.8 mEq/L (ref 3.7–5.3)
SODIUM: 145 meq/L (ref 137–147)

## 2014-06-28 LAB — PROTIME-INR
INR: 2.49 — ABNORMAL HIGH (ref 0.00–1.49)
Prothrombin Time: 26.9 seconds — ABNORMAL HIGH (ref 11.6–15.2)

## 2014-06-28 LAB — DIGOXIN LEVEL: Digoxin Level: 1.1 ng/mL (ref 0.8–2.0)

## 2014-06-28 MED ORDER — ASPIRIN 325 MG PO TABS
325.0000 mg | ORAL_TABLET | Freq: Every day | ORAL | Status: DC
  Administered 2014-06-28 – 2014-07-01 (×4): 325 mg via ORAL
  Filled 2014-06-28 (×4): qty 1

## 2014-06-28 NOTE — Progress Notes (Signed)
    Subjective:  Denies CP or dyspnea; edema improving   Objective:  Filed Vitals:   06/28/14 0000 06/28/14 0400 06/28/14 0500 06/28/14 0747  BP: 132/64 105/39  118/55  Pulse:  75  75  Temp: 98.4 F (36.9 C)   98.2 F (36.8 C)  TempSrc: Oral   Oral  Resp: 22 22  19   Height:      Weight:   163 lb 5.8 oz (74.1 kg)   SpO2: 93% 93%  95%    Intake/Output from previous day:  Intake/Output Summary (Last 24 hours) at 06/28/14 0838 Last data filed at 06/28/14 0300  Gross per 24 hour  Intake    730 ml  Output   2650 ml  Net  -1920 ml    Physical Exam: Physical exam: Well-developed frail in no acute distress.  Skin is warm and dry.  HEENT is normal.  Neck is supple.  Chest diminished BS bases Cardiovascular exam is irregular  Abdominal exam nontender or distended. No masses palpated. Extremities show 1+ edema. neuro grossly intact    Lab Results: Basic Metabolic Panel:  Recent Labs  06/27/14 0142 06/28/14 0243  NA 143 145  K 5.0 4.8  CL 99 99  CO2 31 33*  GLUCOSE 102* 95  BUN 55* 58*  CREATININE 1.64* 1.91*  CALCIUM 9.1 8.6   CBC:  Recent Labs  06/26/14 2050 06/27/14 0142  WBC 5.8 7.8  HGB 13.4 12.7*  HCT 42.9 40.7  MCV 96.4 96.0  PLT 149* 142*   Cardiac Enzymes:  Recent Labs  06/27/14 0142 06/27/14 0328 06/27/14 1031  TROPONINI <0.30 <0.30 <0.30     Assessment/Plan:  1 acute on chronic systolic congestive heart failure-volume status is improving. Continue present dose of Lasix. Follow renal function. We will need to balance keeping patient euvolemic versus worsening chronic renal failure. 2 atrial fibrillation-continue carvedilol. Coumadin discontinued because of fall risk. Add aspirin. Given age and renal function I would discontinue digoxin and follow heart rate. 3 COPD-management per primary care. On home oxygen. 4 syncope-no pauses on telemetry. He is under hospice care and would be conservative. 5 Diffuse large B-cell lymphoma-per  oncology.  Kirk Ruths 06/28/2014, 8:38 AM

## 2014-06-28 NOTE — Progress Notes (Signed)
[facility] Progress Note   Gabriel Estrada ZOX:[id number] DOB: Sep 24, 1928 DOA: 06/26/2014 PCP: Gabriel Kehr, MD  Brief narrative: Gabriel Estrada is a 80 y.o. male  presents with a systolic and diastolic CHF exacerbation. He states his ankles have been swollen for 6 wks now. He has not gone to see his PCP in regards to this. He is taking his Lasix which was started by Dr Percival Spanish 16mo ago. Notes in EPIC reveal he is followed by hospice nurse at home. Last admitted in 2/14 for HCAP and CHF exacerbation   Subjective: Dyspnea continues to improve. Dry cough. Poor historian  Assessment/Plan: Principal Problem:   Acute on chronic combined systolic and diastolic CHF --  Acute respiratory failure with hypoxia - weaned off of BiPAP to [place]- cont to wean O2 as tolerated-  - cardiology managing Lasix - pulse ox about 90 on 4 L currently - follow K and not replace as high normal   Active Problems:    Atrial fibrillation with RVR - rate controlled on Dig, Coreg - d/c Coumadin per Cardiology and place on ASA    CKD (chronic kidney disease), stage III - follow with diuresis    DLBCL (diffuse large B cell lymphoma) - follows with oncology- has a port- noted to havecancelled his last appt on 4/22 (per EPIC) - has been receiving radiation treatments- completed in 1/15- was supposed to f/u in 1 mo  COPD - - on O2 at home ordered by Dr Alain Marion- he is not sure of how much    Cardiomyopathy, ischemic - per cardiology    Code Status: DNR Family Communication: none Disposition Plan: to be determined  Consultants: cardiology  Procedures: none  Antibiotics: Anti-infectives   None       DVT prophylaxis: On Coumadin  Objective: Filed Weights   06/26/14 2044 06/27/14 0100 06/28/14 0500  Weight: 72.576 kg (160 lb) 77.8 kg (171 lb 8.3 oz) 74.1 kg (163 lb 5.8 oz)    Vitals Filed Vitals:   06/28/14 0000 06/28/14 0400 06/28/14 0500 06/28/14 0747  BP: 132/64 105/39  118/55   Pulse:  75  75  Temp: 98.4 F (36.9 C)   98.2 F (36.8 C)  TempSrc: Oral   Oral  Resp: 22 22  19   Height:      Weight:   74.1 kg (163 lb 5.8 oz)   SpO2: 93% 93%  95%      Intake/Output Summary (Last 24 hours) at 06/28/14 0906 Last data filed at 06/28/14 0857  Gross per 24 hour  Intake    878 ml  Output   2700 ml  Net  -1822 ml     Exam: General: No acute respiratory distress Lungs:crackles upto mid lung fields, 4 L O2- pulse ox 905 Cardiovascular: IIRR, without murmur gallop or rub normal S1 and S2 Abdomen: Nontender, nondistended, soft, bowel sounds positive, no rebound, no ascites, no appreciable mass Extremities: No significant cyanosis, clubbing- 2 + pitting edema bilateral lower extremities  Data Reviewed: Basic Metabolic Panel:  Recent Labs Lab 06/26/14 2050 06/27/14 0142 06/28/14 0243  NA 142 143 145  K 5.0 5.0 4.8  CL 98 99 99  CO2 31 31 33*  GLUCOSE 123* 102* 95  BUN 55* 55* 58*  CREATININE 1.60* 1.64* 1.91*  CALCIUM 9.4 9.1 8.6   Liver Function Tests:  Recent Labs Lab 06/27/14 0142  AST 30  ALT 26  ALKPHOS 111  BILITOT 1.3*  PROT 6.4  ALBUMIN 3.5   No results  found for this basename: LIPASE, AMYLASE,  in the last 168 hours No results found for this basename: AMMONIA,  in the last 168 hours CBC:  Recent Labs Lab 06/26/14 2050 06/27/14 0142  WBC 5.8 7.8  HGB 13.4 12.7*  HCT 42.9 40.7  MCV 96.4 96.0  PLT 149* 142*   Cardiac Enzymes:  Recent Labs Lab 06/27/14 0142 06/27/14 0328 06/27/14 1031  TROPONINI <0.30 <0.30 <0.30   BNP (last 3 results)  Recent Labs  01/22/14 1816 01/24/14 0340 06/26/14 2050  PROBNP 4885.0* 5238.0* 12719.0*   CBG: No results found for this basename: GLUCAP,  in the last 168 hours  Recent Results (from the past 240 hour(s))  MRSA PCR SCREENING     Status: None   Collection Time    06/27/14 12:31 AM      Result Value Ref Range Status   MRSA by PCR NEGATIVE  NEGATIVE Final   Comment:             The GeneXpert MRSA Assay (FDA     approved for NASAL specimens     only), is one component of a     comprehensive MRSA colonization     surveillance program. It is not     intended to diagnose MRSA     infection nor to guide or     monitor treatment for     MRSA infections.     Studies:  Recent x-ray studies have been reviewed in detail by the Attending Physician  Scheduled Meds:  Scheduled Meds: . aspirin  325 mg Oral Daily  . carvedilol  6.25 mg Oral BID WC  . chlorhexidine  15 mL Mouth/Throat BID  . ezetimibe-simvastatin  1 tablet Oral QHS  . furosemide  80 mg Intravenous BID  . ipratropium  0.5 mg Nebulization Q6H  . sodium chloride  3 mL Intravenous Q12H   Continuous Infusions:   Time spent on care of this patient: > 35 min   [place], MD 06/28/2014, 9:06 AM  LOS: 2 days   [facility] Office  [phone] Pager - Text Page per Shea Evans   If 7PM-7AM, please contact night-coverage [email or web address]

## 2014-06-28 NOTE — Progress Notes (Signed)
Pt up in chair tolerated well

## 2014-06-28 NOTE — Progress Notes (Signed)
PT Cancellation Note  Patient Details Name: Gabriel Estrada MRN: [id number] DOB: 05-15-1928   Cancelled Treatment:    Reason Eval/Treat Not Completed: Other (comment) (Nursing had just got pt back to bed.  Will return as able.  )   [name],[name] 06/28/2014, 10:17 AM [facility] [phone] (pager)

## 2014-06-28 NOTE — Evaluation (Addendum)
Physical Therapy Evaluation Patient Details Name: Gabriel Estrada MRN: [id number] DOB: [birth year] Today's Date: 06/28/2014   History of Present Illness  Adm 06/26/14 with CHF exacerbation. Pt is currently a Hospice pt, lives alone, and called EMS when SOB. PMHx- lymphoma, CAD, PVD, CKD, HTN, COPD (home O2 2.5 L),    Clinical Impression  Pt admitted with acute CHF. Pt living home alone with Hospice services (including aide for ADLs), however does not have 24/7 assistance. Currently his balance impairments and decr safety awareness put him at high risk for falls and injury. Pt adamantly refuses any discussion of SNF. Pt currently with functional limitations due to the deficits listed below (see PT Problem List).  Pt will benefit from skilled PT to increase their independence and safety with mobility to allow discharge to the venue listed below. Do not anticipate pt will agree to go to SNF as he seems adamant on returning home.     Follow Up Recommendations TBD as CHF improves (pt desaturating today with minimal activity); pt refusing SNF    Equipment Recommendations  None recommended by PT    Recommendations for Other Services OT consult     Precautions / Restrictions Precautions Precautions: Fall      Mobility  Bed Mobility Overal bed mobility: Needs Assistance Bed Mobility: Supine to Sit     Supine to sit: Min guard     General bed mobility comments: Twice with posterior loss of balance with near need for assist to recover   Transfers Overall transfer level: Needs assistance Equipment used: None Transfers: Sit to/from [facility] Sit to Stand: Min assist Stand pivot transfers: Min assist       General transfer comment: initially difficulty translating forward over his BOS (attempted come to stand x 2 before successful); immediately reaching for bil UE support of chair armrests, assist to maintain balance while pivoting to chair  Ambulation/Gait              General Gait Details: pt desaturating to 82 on 4L with transfer; NT  Stairs            Wheelchair Mobility    Modified Rankin (Stroke Patients Only)       Balance Overall balance assessment: Needs assistance Sitting-balance support: Feet supported Sitting balance-Leahy Scale: Fair   Postural control: Posterior lean (when initiates dynamic sitting) Standing balance support: Bilateral upper extremity supported Standing balance-Leahy Scale: Poor                               Pertinent Vitals/Pain SaO2 92% on 4L at rest; with sit EOB and transfer to chair, decr to 82% (good waveform, with slow return to 88-89%). Pt requesting to eat lunch and noted desaturating while eating.  Denied pain    Home Living Family/patient expects to be discharged to:: Private residence Living Arrangements: Alone Available Help at Discharge: Personal care attendant;Available PRN/intermittently;Family Type of Home: House Home Access: Stairs to enter Entrance Stairs-Rails: Right;Left;Can reach both Entrance Stairs-Number of Steps: 6 Home Layout: Two level;Bed/bath upstairs Home Equipment: Walker - 2 wheels;Cane - single point;Shower seat Additional Comments: RW is his sister's (although he may use it); typically uses cane outside only    Prior Function Level of Independence: Independent with assistive device(s)         Comments: pt reports occasional use of cane     Hand Dominance   Dominant Hand: Right    Extremity/Trunk Assessment  Upper Extremity Assessment: Generalized weakness           Lower Extremity Assessment: Generalized weakness      Cervical / Trunk Assessment: Normal  Communication   Communication: No difficulties  Cognition Arousal/Alertness: Awake/alert Behavior During Therapy: WFL for tasks assessed/performed Overall Cognitive Status: No family/caregiver present to determine baseline cognitive functioning Area of Impairment:  Safety/judgement;Problem solving         Safety/Judgement: Decreased awareness of safety   Problem Solving: Requires verbal cues;Requires tactile cues General Comments: pt slightly impulsive when came to EOB, pt reaching to hold onto armrest of chair and not willing to hold onto therapist; required cues for pivoting to chair without wrapping his lines/tubes around himself    General Comments General comments (skin integrity, edema, etc.): Pt initially refusing to participate because he was "not feeling good." Pt unable to elaborate and denied any symptoms PT expressed. Ultimately he said he just wants to go home and is "out of sorts." Explained role of PT in assisting to determine if he is strong enough/safe to go home, and pt then agreed to participate. Pt's bed linens removed and noted condom catheter was leaking. It appeared intact and RN in to assess and ultimately replace condom catheter. Pt stated he was unaware he was lying in a wet gown and wet bed.     Exercises        Assessment/Plan    PT Assessment Patient needs continued PT services  PT Diagnosis Difficulty walking;Generalized weakness   PT Problem List Decreased strength;Decreased activity tolerance;Decreased balance;Decreased mobility;Decreased knowledge of use of DME;Decreased safety awareness;Cardiopulmonary status limiting activity  PT Treatment Interventions DME instruction;Gait training;Functional mobility training;Stair training;Therapeutic activities;Therapeutic exercise;Balance training;Cognitive remediation;Patient/family education   PT Goals (Current goals can be found in the Care Plan section) Acute Rehab PT Goals Patient Stated Goal: return home PT Goal Formulation: With patient Time For Goal Achievement: 07/05/14 Potential to Achieve Goals: Fair    Frequency Min 3X/week   Barriers to discharge Decreased caregiver support      Co-evaluation               End of Session Equipment Utilized During  Treatment: Oxygen Activity Tolerance: Treatment limited secondary to medical complications (Comment) (decr SaO2) Patient left: in chair;with call bell/phone within reach;Other (comment) (eating lunch with feet down (he refused elevation to eat)) Nurse Communication: Mobility status         Time: 6644-0347 PT Time Calculation (min): 22 min   Charges:   PT Evaluation $Initial PT Evaluation Tier I: 1 Procedure     PT G Codes:          Inman Fettig July 05, 2014, 1:32 PM Pager [phone]]

## 2014-06-28 NOTE — Progress Notes (Signed)
GIP Visit [place] 2H16 [facility] RN visit-Erin Maxwell Caul RN   Patient sitting up in chair with PT at bedside. Patient is alert and was unable to participate much in PT because of oxygen levels dropping. Patient has no complaints at this time. No family at bedside. There are concerns about patient returning home alone with no care in the home.   Please call [facility] @ [phone] with any hospice needs Ardath Sax RN

## 2014-06-28 NOTE — Progress Notes (Signed)
Pharmacist Heart Failure Core Measure Documentation  Assessment: Gabriel Estrada has an EF documented as 35-40% on 01/23/14 by ECHO.  Rationale: Heart failure patients with left ventricular systolic dysfunction (LVSD) and an EF < 40% should be prescribed an angiotensin converting enzyme inhibitor (ACEI) or angiotensin receptor blocker (ARB) at discharge unless a contraindication is documented in the medical record.  This patient is not currently on an ACEI or ARB for HF.  This note is being placed in the record in order to provide documentation that a contraindication to the use of these agents is present for this encounter.  ACE Inhibitor or Angiotensin Receptor Blocker is contraindicated (specify all that apply)  []   ACEI allergy AND ARB allergy []   Angioedema []   Moderate or severe aortic stenosis []   Hyperkalemia []   Hypotension []   Renal artery stenosis [x]   Worsening renal function, preexisting renal disease or dysfunction   Deboraha Sprang 06/28/2014 11:17 AM

## 2014-06-29 ENCOUNTER — Inpatient Hospital Stay (HOSPITAL_COMMUNITY)

## 2014-06-29 LAB — BASIC METABOLIC PANEL
Anion gap: 11 (ref 5–15)
BUN: 62 mg/dL — ABNORMAL HIGH (ref 6–23)
CO2: 35 meq/L — AB (ref 19–32)
Calcium: 8.3 mg/dL — ABNORMAL LOW (ref 8.4–10.5)
Chloride: 95 mEq/L — ABNORMAL LOW (ref 96–112)
Creatinine, Ser: 1.81 mg/dL — ABNORMAL HIGH (ref 0.50–1.35)
GFR calc Af Amer: 38 mL/min — ABNORMAL LOW (ref >90)
GFR calc non Af Amer: 33 mL/min — ABNORMAL LOW (ref >90)
GLUCOSE: 114 mg/dL — AB (ref 70–99)
POTASSIUM: 3.9 meq/L (ref 3.7–5.3)
Sodium: 141 mEq/L (ref 137–147)

## 2014-06-29 LAB — BLOOD GAS, ARTERIAL
Acid-Base Excess: 13.5 mmol/L — ABNORMAL HIGH (ref 0.0–2.0)
Bicarbonate: 38 mEq/L — ABNORMAL HIGH (ref 20.0–24.0)
Drawn by: 105521
O2 Content: 5 L/min
O2 Saturation: 93.8 %
PATIENT TEMPERATURE: 98.6
PCO2 ART: 51.9 mmHg — AB (ref 35.0–45.0)
TCO2: 39.6 mmol/L (ref 0–100)
pH, Arterial: 7.478 — ABNORMAL HIGH (ref 7.350–7.450)
pO2, Arterial: 67.6 mmHg — ABNORMAL LOW (ref 80.0–100.0)

## 2014-06-29 LAB — PROTIME-INR
INR: 2.1 — ABNORMAL HIGH (ref 0.00–1.49)
Prothrombin Time: 23.6 seconds — ABNORMAL HIGH (ref 11.6–15.2)

## 2014-06-29 MED ORDER — FUROSEMIDE 80 MG PO TABS
80.0000 mg | ORAL_TABLET | Freq: Two times a day (BID) | ORAL | Status: DC
  Administered 2014-06-29 – 2014-07-01 (×4): 80 mg via ORAL
  Filled 2014-06-29 (×6): qty 1

## 2014-06-29 MED ORDER — FUROSEMIDE 80 MG PO TABS
80.0000 mg | ORAL_TABLET | Freq: Two times a day (BID) | ORAL | Status: DC
  Filled 2014-06-29 (×3): qty 1

## 2014-06-29 MED ORDER — GUAIFENESIN ER 600 MG PO TB12
600.0000 mg | ORAL_TABLET | Freq: Two times a day (BID) | ORAL | Status: DC
  Administered 2014-06-29 – 2014-07-01 (×5): 600 mg via ORAL
  Filled 2014-06-29 (×6): qty 1

## 2014-06-29 NOTE — Progress Notes (Signed)
    Subjective:  Denies CP or dyspnea; edema improving   Objective:  Filed Vitals:   06/29/14 0153 06/29/14 0300 06/29/14 0322 06/29/14 0610  BP:  100/55    Pulse:  71 71   Temp:  98.6 F (37 C)    TempSrc:  Oral    Resp:  18 22 23   Height:      Weight:  167 lb 8.8 oz (76 kg)    SpO2: 96% 94% 93% 85%    Intake/Output from previous day:  Intake/Output Summary (Last 24 hours) at 06/29/14 0813 Last data filed at 06/29/14 0700  Gross per 24 hour  Intake    848 ml  Output   2950 ml  Net  -2102 ml    Physical Exam: Physical exam: Well-developed frail in no acute distress.  Skin is warm and dry.  HEENT is normal.  Neck is supple.  Chest diminished BS bases Cardiovascular exam is irregular  Abdominal exam nontender or distended. No masses palpated. Extremities show 1+ ankle edema. neuro grossly intact    Lab Results: Basic Metabolic Panel:  Recent Labs  06/28/14 0243 06/29/14 0245  NA 145 141  K 4.8 3.9  CL 99 95*  CO2 33* 35*  GLUCOSE 95 114*  BUN 58* 62*  CREATININE 1.91* 1.81*  CALCIUM 8.6 8.3*   CBC:  Recent Labs  06/26/14 2050 06/27/14 0142  WBC 5.8 7.8  HGB 13.4 12.7*  HCT 42.9 40.7  MCV 96.4 96.0  PLT 149* 142*   Cardiac Enzymes:  Recent Labs  06/27/14 0142 06/27/14 0328 06/27/14 1031  TROPONINI <0.30 <0.30 <0.30     Assessment/Plan:  1 acute on chronic systolic congestive heart failure-volume status is improving. Change lasix to 80 mg po BID. Follow renal function. We will need to balance keeping patient euvolemic versus worsening chronic renal failure. Patient can be transferred to telemetry from a cardiac standpoint. 2 atrial fibrillation-continue carvedilol. Coumadin discontinued because of fall risk. Continue aspirin. Digoxin DCed. Follow HR. 3 COPD-management per primary care. On home oxygen. 4 syncope-no pauses on telemetry. He is under hospice care and would be conservative. 5 Diffuse large B-cell lymphoma-per  oncology.  Kirk Ruths 06/29/2014, 8:13 AM

## 2014-06-29 NOTE — Progress Notes (Addendum)
[facility] Progress Note   Gabriel Estrada TZG:[id number] DOB: 1928/07/05 DOA: 06/26/2014 PCP: Walker Kehr, MD  Brief narrative: Gabriel Estrada is a 80 y.o. male  presents with a systolic and diastolic CHF exacerbation. He states his ankles have been swollen for 6 wks now. He has not gone to see his PCP in regards to this. He is taking his Lasix which was started by Dr Percival Spanish 37mo ago. Notes in EPIC reveal he is followed by hospice nurse at home. Last admitted in 2/14 for HCAP and CHF exacerbation   Subjective: Feels well. No complaints.   Assessment/Plan: Principal Problem:   Acute on chronic combined systolic and diastolic CHF --  Acute respiratory failure with hypoxia - weaned off of BiPAP to [place]- on 4-5 L O2 during the day- requiring 6 at night as he breaths through his mouth when he sleeps. - cardiology managing Lasix- becoming alkalotic- may need diamox - CXR today continues to show pulm edema with fluid in the fissure - check ABG (for alkalosis) - neg balance by 4300 cc - pulse ox about 90 on 4 L currently - follow K and replace as needed   Active Problems:    Atrial fibrillation with RVR - rate controlled on Dig, Coreg - d/c Coumadin per Cardiology recommendations and place on ASA     CKD (chronic kidney disease), stage III - follow with diuresis    DLBCL (diffuse large B cell lymphoma) - follows with oncology- has a port- noted to have cancelled his last appt on 4/22 (per EPIC) - received radiation treatments- completed in 1/15- was supposed to f/u in 1 mo  COPD - - on O2 at home ordered by Dr Alain Marion- he is not sure of how much - sound very congested today- add Flutter valve, Mucinex and IS - receiving Nebs Q6    Cardiomyopathy, ischemic - per cardiology    Code Status: DNR Family Communication: none Disposition Plan: to be determined  Consultants: cardiology  Procedures: none  Antibiotics: Anti-infectives   None       DVT  prophylaxis: On Coumadin  Objective: Filed Weights   06/27/14 0100 06/28/14 0500 06/29/14 0300  Weight: 77.8 kg (171 lb 8.3 oz) 74.1 kg (163 lb 5.8 oz) 76 kg (167 lb 8.8 oz)    Vitals Filed Vitals:   06/29/14 0610 06/29/14 0812 06/29/14 0929 06/29/14 1125  BP:  134/95  97/52  Pulse:      Temp:  97.4 F (36.3 C)  98 F (36.7 C)  TempSrc:  Oral  Oral  Resp: 23 28  18   Height:      Weight:      SpO2: 85% 95% 91% 95%      Intake/Output Summary (Last 24 hours) at 06/29/14 1213 Last data filed at 06/29/14 0700  Gross per 24 hour  Intake    450 ml  Output   2100 ml  Net  -1650 ml     Exam: General: No acute respiratory distress Lungs: ronchi and decreased breath sounds in LLL, 4 L O2- pulse ox 90% Cardiovascular: IIRR, without murmur gallop or rub normal S1 and S2 Abdomen: Nontender, nondistended, soft, bowel sounds positive, no rebound, no ascites, no appreciable mass Extremities: No significant cyanosis, clubbing- 2 + pitting edema bilateral lower extremities  Data Reviewed: Basic Metabolic Panel:  Recent Labs Lab 06/26/14 2050 06/27/14 0142 06/28/14 0243 06/29/14 0245  NA 142 143 145 141  K 5.0 5.0 4.8 3.9  CL 98 99 99 95*  CO2 31 31 33* 35*  GLUCOSE 123* 102* 95 114*  BUN 55* 55* 58* 62*  CREATININE 1.60* 1.64* 1.91* 1.81*  CALCIUM 9.4 9.1 8.6 8.3*   Liver Function Tests:  Recent Labs Lab 06/27/14 0142  AST 30  ALT 26  ALKPHOS 111  BILITOT 1.3*  PROT 6.4  ALBUMIN 3.5   No results found for this basename: LIPASE, AMYLASE,  in the last 168 hours No results found for this basename: AMMONIA,  in the last 168 hours CBC:  Recent Labs Lab 06/26/14 2050 06/27/14 0142  WBC 5.8 7.8  HGB 13.4 12.7*  HCT 42.9 40.7  MCV 96.4 96.0  PLT 149* 142*   Cardiac Enzymes:  Recent Labs Lab 06/27/14 0142 06/27/14 0328 06/27/14 1031  TROPONINI <0.30 <0.30 <0.30   BNP (last 3 results)  Recent Labs  01/22/14 1816 01/24/14 0340 06/26/14 2050   PROBNP 4885.0* 5238.0* 12719.0*   CBG: No results found for this basename: GLUCAP,  in the last 168 hours  Recent Results (from the past 240 hour(s))  MRSA PCR SCREENING     Status: None   Collection Time    06/27/14 12:31 AM      Result Value Ref Range Status   MRSA by PCR NEGATIVE  NEGATIVE Final   Comment:            The GeneXpert MRSA Assay (FDA     approved for NASAL specimens     only), is one component of a     comprehensive MRSA colonization     surveillance program. It is not     intended to diagnose MRSA     infection nor to guide or     monitor treatment for     MRSA infections.     Studies:  Recent x-ray studies have been reviewed in detail by the Attending Physician  Scheduled Meds:  Scheduled Meds: . aspirin  325 mg Oral Daily  . carvedilol  6.25 mg Oral BID WC  . chlorhexidine  15 mL Mouth/Throat BID  . ezetimibe-simvastatin  1 tablet Oral QHS  . furosemide  80 mg Oral BID  . guaiFENesin  600 mg Oral BID  . ipratropium  0.5 mg Nebulization Q6H  . sodium chloride  3 mL Intravenous Q12H   Continuous Infusions:   Time spent on care of this patient: > 35 min   [place], MD 06/29/2014, 12:13 PM  LOS: 3 days   [facility] Office  [phone] Pager - Text Page per Shea Evans   If 7PM-7AM, please contact night-coverage [email or web address]

## 2014-06-29 NOTE — Progress Notes (Signed)
GIP Visit [place] Room [facility] 2H16 [facility] RN visit-Erin Maxwell Caul RN   Related admission to hospice diagnosis of CHF. Patient is a DNR. Patient is alert and oriented sitting up in the bed. Patient states that he is feeling better today. Patient states that he might discharge on Tuesday. No acute distress noted, patient has no concerns or needs at this time. No family at bedside.   Please call [facility] @ [phone] with any hospice needs  Ardath Sax RN

## 2014-06-30 LAB — BASIC METABOLIC PANEL
ANION GAP: 8 (ref 5–15)
BUN: 60 mg/dL — ABNORMAL HIGH (ref 6–23)
CHLORIDE: 92 meq/L — AB (ref 96–112)
CO2: 39 mEq/L — ABNORMAL HIGH (ref 19–32)
CREATININE: 1.85 mg/dL — AB (ref 0.50–1.35)
Calcium: 8.7 mg/dL (ref 8.4–10.5)
GFR calc non Af Amer: 32 mL/min — ABNORMAL LOW (ref >90)
GFR, EST AFRICAN AMERICAN: 37 mL/min — AB (ref >90)
Glucose, Bld: 99 mg/dL (ref 70–99)
Potassium: 3.9 mEq/L (ref 3.7–5.3)
Sodium: 139 mEq/L (ref 137–147)

## 2014-06-30 LAB — CBC
HCT: 36.7 % — ABNORMAL LOW (ref 39.0–52.0)
Hemoglobin: 11.4 g/dL — ABNORMAL LOW (ref 13.0–17.0)
MCH: 29.7 pg (ref 26.0–34.0)
MCHC: 31.1 g/dL (ref 30.0–36.0)
MCV: 95.6 fL (ref 78.0–100.0)
Platelets: 146 10*3/uL — ABNORMAL LOW (ref 150–400)
RBC: 3.84 MIL/uL — ABNORMAL LOW (ref 4.22–5.81)
RDW: 16 % — ABNORMAL HIGH (ref 11.5–15.5)
WBC: 5.2 10*3/uL (ref 4.0–10.5)

## 2014-06-30 MED ORDER — HEPARIN SODIUM (PORCINE) 5000 UNIT/ML IJ SOLN
5000.0000 [IU] | Freq: Three times a day (TID) | INTRAMUSCULAR | Status: DC
  Administered 2014-06-30 – 2014-07-01 (×4): 5000 [IU] via SUBCUTANEOUS
  Filled 2014-06-30 (×8): qty 1

## 2014-06-30 MED ORDER — ACETAZOLAMIDE 250 MG PO TABS
250.0000 mg | ORAL_TABLET | Freq: Two times a day (BID) | ORAL | Status: DC
  Administered 2014-06-30 – 2014-07-01 (×3): 250 mg via ORAL
  Filled 2014-06-30 (×4): qty 1

## 2014-06-30 NOTE — Progress Notes (Signed)
Patient arrived to the floor at 1320 via wheelchair from 2h.  Patient placed on monitor and CCMD notified.  Patient oriented to room and callbell.

## 2014-06-30 NOTE — Progress Notes (Signed)
    Subjective:  Denies CP or dyspnea   Objective:  Filed Vitals:   06/29/14 2309 06/30/14 0138 06/30/14 0300 06/30/14 0800  BP: 108/43  113/57 120/62  Pulse: 65     Temp: 97.3 F (36.3 C)  97.3 F (36.3 C) 97.7 F (36.5 C)  TempSrc: Oral  Oral Oral  Resp: 21  17 21   Height:      Weight:   163 lb 2.3 oz (74 kg)   SpO2: 97% 98% 94% 93%    Intake/Output from previous day:  Intake/Output Summary (Last 24 hours) at 06/30/14 0818 Last data filed at 06/30/14 0300  Gross per 24 hour  Intake    360 ml  Output   1200 ml  Net   -840 ml    Physical Exam: Physical exam: Well-developed frail in no acute distress.  Skin is warm and dry.  HEENT is normal.  Neck is supple.  Chest diminished BS bases Cardiovascular exam is irregular  Abdominal exam nontender or distended. No masses palpated. Extremities show 1+ ankle edema. neuro grossly intact    Lab Results: Basic Metabolic Panel:  Recent Labs  06/29/14 0245 06/30/14 0324  NA 141 139  K 3.9 3.9  CL 95* 92*  CO2 35* 39*  GLUCOSE 114* 99  BUN 62* 60*  CREATININE 1.81* 1.85*  CALCIUM 8.3* 8.7   CBC:  Recent Labs  06/30/14 0324  WBC 5.2  HGB 11.4*  HCT 36.7*  MCV 95.6  PLT 146*   Cardiac Enzymes:  Recent Labs  06/27/14 1031  TROPONINI <0.30     Assessment/Plan:  1 acute on chronic systolic congestive heart failure-volume status is improving. Continue lasix 80 mg po BID. Follow renal function. Add diamox for contraction alkalosis. We will need to balance keeping patient euvolemic versus worsening chronic renal failure. Patient can be transferred to telemetry from a cardiac standpoint. 2 atrial fibrillation-continue carvedilol. Coumadin discontinued because of fall risk. Continue aspirin. Digoxin DCed. Follow HR (stable thus far). 3 COPD-management per primary care. On home oxygen. 4 syncope-no pauses on telemetry. He is under hospice care and would be conservative. 5 Diffuse large B-cell lymphoma-per  oncology.  Kirk Ruths 06/30/2014, 8:18 AM

## 2014-06-30 NOTE — Progress Notes (Addendum)
[facility] Progress Note   Gabriel Estrada BSW:[id number] DOB: 10/08/1928 DOA: 06/26/2014 PCP: Walker Kehr, MD  Brief narrative: Gabriel Estrada is a 80 y.o. male  presents with a systolic and diastolic CHF exacerbation. He states his ankles have been swollen for 6 wks now. He has not gone to see his PCP in regards to this. He is taking his Lasix which was started by Dr Percival Spanish 81mo ago. Notes in EPIC reveal he is followed by hospice nurse at home. Last admitted in 2/14 for HCAP and CHF exacerbation   Subjective: Continues to feel well. I have weaned O2 down to 3 L- pulse ox 94%  Assessment/Plan: Principal Problem:   Acute on chronic combined systolic and diastolic CHF --  Acute respiratory failure with hypoxia - weaned off of BiPAP to [place]- on 4-5 L O2 during the day- requiring 6 at night as he breaths through his mouth when he sleeps. - cardiology managing Lasix- becoming alkalotic- diamox added by cardiology - recent CXR continues to show pulm edema with fluid in the fissure -  ABG reveals metabolic alkalosis  - neg balance by 6100 cc - pulse ox about 94% on 3 L currently - follow K and replace as needed   Active Problems:    Atrial fibrillation with RVR - rate controlled on Dig, Coreg - d/c Coumadin per Cardiology recommendations and place on ASA     CKD (chronic kidney disease), stage III - follow with diuresis    DLBCL (diffuse large B cell lymphoma) - follows with oncology- has a port- noted to have cancelled his last appt on 4/22 (per EPIC) - received radiation treatments- completed in 1/15- was supposed to f/u in 1 mo  COPD - - on O2 at home ordered by Dr Alain Marion- he is not sure of how much  - added Flutter valve, Mucinex and IS as he was quite congested - receiving Nebs Q6    Cardiomyopathy, ischemic - per cardiology    Code Status: DNR Family Communication: none Disposition Plan: to be determined- sister who helps care for him at home is in hospital-  he refuses to go to a nursing facility for further care- has hospice nurses at home  Consultants: cardiology  Procedures: none  Antibiotics: Anti-infectives   None       DVT prophylaxis: Has been on Coumadin- INR sill elevated  Objective: Filed Weights   06/28/14 0500 06/29/14 0300 06/30/14 0300  Weight: 74.1 kg (163 lb 5.8 oz) 76 kg (167 lb 8.8 oz) 74 kg (163 lb 2.3 oz)    Vitals Filed Vitals:   06/30/14 0300 06/30/14 0800 06/30/14 0827 06/30/14 1210  BP: 113/57 120/62  102/45  Pulse:      Temp: 97.3 F (36.3 C) 97.7 F (36.5 C)  98 F (36.7 C)  TempSrc: Oral Oral  Oral  Resp: 17 21    Height:      Weight: 74 kg (163 lb 2.3 oz)     SpO2: 94% 93% 88%       Intake/Output Summary (Last 24 hours) at 06/30/14 1228 Last data filed at 06/30/14 0800  Gross per 24 hour  Intake    360 ml  Output   2100 ml  Net  -1740 ml     Exam: General: No acute respiratory distress Lungs: ronchi and decreased breath sounds in LLL, 3 L O2- pulse ox 94% Cardiovascular: IIRR, without murmur gallop or rub normal S1 and S2 Abdomen: Nontender, nondistended, soft, bowel sounds positive,  no rebound, no ascites, no appreciable mass Extremities: No significant cyanosis, clubbing- 1+pitting edema bilateral lower extremities  Data Reviewed: Basic Metabolic Panel:  Recent Labs Lab 06/26/14 2050 06/27/14 0142 06/28/14 0243 06/29/14 0245 06/30/14 0324  NA 142 143 145 141 139  K 5.0 5.0 4.8 3.9 3.9  CL 98 99 99 95* 92*  CO2 31 31 33* 35* 39*  GLUCOSE 123* 102* 95 114* 99  BUN 55* 55* 58* 62* 60*  CREATININE 1.60* 1.64* 1.91* 1.81* 1.85*  CALCIUM 9.4 9.1 8.6 8.3* 8.7   Liver Function Tests:  Recent Labs Lab 06/27/14 0142  AST 30  ALT 26  ALKPHOS 111  BILITOT 1.3*  PROT 6.4  ALBUMIN 3.5   No results found for this basename: LIPASE, AMYLASE,  in the last 168 hours No results found for this basename: AMMONIA,  in the last 168 hours CBC:  Recent Labs Lab  06/26/14 2050 06/27/14 0142 06/30/14 0324  WBC 5.8 7.8 5.2  HGB 13.4 12.7* 11.4*  HCT 42.9 40.7 36.7*  MCV 96.4 96.0 95.6  PLT 149* 142* 146*   Cardiac Enzymes:  Recent Labs Lab 06/27/14 0142 06/27/14 0328 06/27/14 1031  TROPONINI <0.30 <0.30 <0.30   BNP (last 3 results)  Recent Labs  01/22/14 1816 01/24/14 0340 06/26/14 2050  PROBNP 4885.0* 5238.0* 12719.0*   CBG: No results found for this basename: GLUCAP,  in the last 168 hours  Recent Results (from the past 240 hour(s))  MRSA PCR SCREENING     Status: None   Collection Time    06/27/14 12:31 AM      Result Value Ref Range Status   MRSA by PCR NEGATIVE  NEGATIVE Final   Comment:            The GeneXpert MRSA Assay (FDA     approved for NASAL specimens     only), is one component of a     comprehensive MRSA colonization     surveillance program. It is not     intended to diagnose MRSA     infection nor to guide or     monitor treatment for     MRSA infections.     Studies:  Recent x-ray studies have been reviewed in detail by the Attending Physician  Scheduled Meds:  Scheduled Meds: . acetaZOLAMIDE  250 mg Oral BID  . aspirin  325 mg Oral Daily  . carvedilol  6.25 mg Oral BID WC  . chlorhexidine  15 mL Mouth/Throat BID  . ezetimibe-simvastatin  1 tablet Oral QHS  . furosemide  80 mg Oral BID  . guaiFENesin  600 mg Oral BID  . heparin subcutaneous  5,000 Units Subcutaneous 3 times per day  . ipratropium  0.5 mg Nebulization Q6H  . sodium chloride  3 mL Intravenous Q12H   Continuous Infusions:   Time spent on care of this patient:  35 min   [place], MD 06/30/2014, 12:28 PM  LOS: 4 days   [facility] Office  [phone] Pager - Text Page per Shea Evans   If 7PM-7AM, please contact night-coverage [email or web address]

## 2014-06-30 NOTE — Progress Notes (Addendum)
Inpatient RN visit- Gabriel Estrada 3W Room 16 -[facility] RN Visit-Karen Alford Highland RN  Related admission to [facility] diagnosis of CHF. Pt is DNR  code.  OOF DNR in place in patient's home.  Pt alert, sitting on the side of the bed PT present about to work with patient.  Pt was very focused on his therapy and did not wish to visit. Writer spoke with staff RN Nellie, pt had just gotten to this new floor, no concerns voiced at this time.  Per chart review discharge disposition is pending. No family present at time of visit.  [facility] will continue to follow through discharge.   Patient's home medication list and transfer summary in place on shadow chart.   Please call [facility] @ [phone] with any hospice needs.   Thank you. Tracey Harries, RN  [facility]  Hospice Liaison  [phone])

## 2014-06-30 NOTE — Progress Notes (Signed)
[facility] [place] Work note Patient is currently receiving hospice services at home. He lives in the home of his sister, Lelan Pons and until two weeks ago they had both been residing together. His sister's health was failing and family moved her to [facility] and had planned on having patient move there too. Patient refused and has been at home alone. He has recently hired pd cg for a few hours each day. He fell 2wks ago and most recently had "passing out spell" in the bathroom. Hospice has been concerned for his safety and care giving needs, however, he refuses to transfer to [facility]. Hospice has placed lock box on his front porch and are working on safety precautions as patient allows. He is angry with a niece and nephew, refusing to talk to them- their mother Westley Hummer is HCPOA.  Patient wants to return home upon discharge. His son and daughter are to travel to [place] to see him this week. Support, education offered.  Katherina Right, [place]

## 2014-06-30 NOTE — Progress Notes (Signed)
Physical Therapy Treatment Patient Details Name: Gabriel Estrada MRN: [id number] DOB: 28-Oct-1928 Today's Date: 06/30/2014    History of Present Illness Adm 06/26/14 with CHF exacerbation. Pt is currently a Hospice pt, lives alone, and called EMS when SOB. PMHx- lymphoma, CAD, PVD, CKD, HTN, COPD (home O2 2.5 L),      PT Comments    Pt admitted with above. Pt currently with functional limitations due to balance and endurance deficits as well as safety issues.  Pt will benefit from skilled PT to increase their independence and safety with mobility to allow discharge to the venue listed below.   Follow Up Recommendations  SNF;Supervision/Assistance - 24 hour     Equipment Recommendations  None recommended by PT    Recommendations for Other Services OT consult     Precautions / Restrictions Precautions Precautions: Fall Restrictions Weight Bearing Restrictions: No    Mobility  Bed Mobility Overal bed mobility: Needs Assistance Bed Mobility: Supine to Sit     Supine to sit: Min guard     General bed mobility comments:  Had posterior loss of balance with near need for assist to recover   Transfers Overall transfer level: Needs assistance Equipment used: Rolling walker (2 wheeled) Transfers: Sit to/from Stand Sit to Stand: Mod assist         General transfer comment: initially difficulty translating forward over his BOS (attempted come to stand x 2 before successful.  Needed steadying assist initially upon standing as well.    Ambulation/Gait Ambulation/Gait assistance: Min assist Ambulation Distance (Feet): 35 Feet Assistive device: Rolling walker (2 wheeled) Gait Pattern/deviations: Step-to pattern;Decreased stride length;Decreased step length - right;Decreased step length - left;Shuffle;Drifts right/left;Trunk flexed;Wide base of support   Gait velocity interpretation: Below normal speed for age/gender General Gait Details: pt desaturating to 81 on 4L with ambulation.   Took 2 min for pt to recover sats to 92% and had to incr to 5L to get sats >90% after this ambulation.  Gait unsteady overall with pt needing assist and cues throughout ambulation.  Pt needed cues to stay close to RW as well as cues to sequence steps and RW.  Pt with flexed posture as well.  Poor overall safety.     Stairs            Wheelchair Mobility    Modified Rankin (Stroke Patients Only)       Balance Overall balance assessment: Needs assistance;History of Falls Sitting-balance support: No upper extremity supported;Feet supported Sitting balance-Leahy Scale: Poor Sitting balance - Comments: Needed UE support to sit EOB.  Postural control: Posterior lean Standing balance support: Bilateral upper extremity supported;During functional activity Standing balance-Leahy Scale: Poor Standing balance comment: Requires UE support for standing.  Relying on RW quite a bit.                     Cognition Arousal/Alertness: Awake/alert Behavior During Therapy: WFL for tasks assessed/performed Overall Cognitive Status: No family/caregiver present to determine baseline cognitive functioning Area of Impairment: Safety/judgement;Problem solving         Safety/Judgement: Decreased awareness of safety   Problem Solving: Requires verbal cues;Requires tactile cues      Exercises      General Comments        Pertinent Vitals/Pain VSS, no pain    Home Living                      Prior Function  PT Goals (current goals can now be found in the care plan section) Progress towards PT goals: Progressing toward goals    Frequency  Min 3X/week    PT Plan Current plan remains appropriate    Co-evaluation             End of Session Equipment Utilized During Treatment: Gait belt;Oxygen Activity Tolerance: Patient limited by fatigue Patient left: in chair;with call bell/phone within reach     Time: 1346-1358 PT Time Calculation (min): 12  min  Charges:  $Gait Training: 8-22 mins                    G Codes:      Estrada,Gabriel Fuerst 07/13/2014, 2:59 PM [facility] [phone] (pager)

## 2014-07-01 DIAGNOSIS — J449 Chronic obstructive pulmonary disease, unspecified: Secondary | ICD-10-CM | POA: Diagnosis present

## 2014-07-01 LAB — BASIC METABOLIC PANEL
ANION GAP: 12 (ref 5–15)
BUN: 60 mg/dL — ABNORMAL HIGH (ref 6–23)
CO2: 37 mEq/L — ABNORMAL HIGH (ref 19–32)
Calcium: 8.9 mg/dL (ref 8.4–10.5)
Chloride: 93 mEq/L — ABNORMAL LOW (ref 96–112)
Creatinine, Ser: 1.68 mg/dL — ABNORMAL HIGH (ref 0.50–1.35)
GFR calc Af Amer: 42 mL/min — ABNORMAL LOW (ref >90)
GFR, EST NON AFRICAN AMERICAN: 36 mL/min — AB (ref >90)
GLUCOSE: 126 mg/dL — AB (ref 70–99)
POTASSIUM: 3.7 meq/L (ref 3.7–5.3)
SODIUM: 142 meq/L (ref 137–147)

## 2014-07-01 MED ORDER — FUROSEMIDE 20 MG PO TABS: 40.0000 mg | ORAL_TABLET | Freq: Two times a day (BID) | ORAL | Status: AC

## 2014-07-01 MED ORDER — ACETAZOLAMIDE 250 MG PO TABS: 250.0000 mg | ORAL_TABLET | Freq: Two times a day (BID) | ORAL | Status: AC

## 2014-07-01 MED ORDER — LEVALBUTEROL TARTRATE 45 MCG/ACT IN AERO: 1.0000 | INHALATION_SPRAY | Freq: Four times a day (QID) | RESPIRATORY_TRACT | Status: AC

## 2014-07-01 MED ORDER — ASPIRIN 325 MG PO TABS: 325.0000 mg | ORAL_TABLET | Freq: Every day | ORAL | Status: AC

## 2014-07-01 MED ORDER — GUAIFENESIN ER 600 MG PO TB12: 600.0000 mg | ORAL_TABLET | Freq: Two times a day (BID) | ORAL | Status: AC

## 2014-07-01 NOTE — Progress Notes (Signed)
Lab will be in to draw BMP, They have not arrived on normal schedule but will be there to do lab draw on the patient as soon as possible. Will continue to monitor patient to end of shift.

## 2014-07-01 NOTE — Progress Notes (Signed)
Patient is A/Ox4 and ambulatory with 1person assist. He had no c/o pain and no signs of distress during the shift. IV taken out. Discharge paperwork was discussed with patient and his private care giver. Private caregiver brought patient's home oxygen to the room. Pt.was transported by wheelchair on home oxygen accompanied by NT and caregiver to private vehicle.

## 2014-07-01 NOTE — Progress Notes (Signed)
Patient ambulated 150 feet in hallway.  O2 sats remained between 91%-94% on 4 liters o2.

## 2014-07-01 NOTE — Progress Notes (Signed)
Inpatient RN visit- [facility] 3 E Room 16-[facility] RN Visit-Karen Alford Highland RN  Related admission to [facility] diagnosis of CHF.   Pt is DNR  Code. OOf DNR in place in patient's home. Pt alert & oriented,sitting on the side of the bed. Patient reports he doesn't "feel as good as I did yesterday". He could not elaborate. Staff tech Bonnita Nasuti present, pt just off the [facility]. Assisted pt to lie down in bed. Patient repositioned for comfort with staff tech [place]. Pt denied pain or discomfort. Reports he is ready to go home, he denied any concerns regarding being alone. He stated "I have good neighbors".  Per staff RN [facility] plan is for patient to discharge today. Discussed patient's current liter flow of 5L with [place] and CSW Butch Penny, pt was previously on 3 liters at home. Pt will need oxygen for transport. Confirmed with [facility] SW Charyl Bigger that patient's sister would be the contact to bring patient home, number confirmed with [facility]. [facility] home care team alerted to planned discharge. Patient's home medication list and transfer summary in place on shadow chart.   Please call [facility] @ [phone] with any hospice needs.   Thank you. Tracey Harries, RN  [facility]  Hospice Liaison  [phone])

## 2014-07-01 NOTE — Discharge Summary (Signed)
Physician Discharge Summary  Gabriel Estrada GGY:[id number] DOB: 25-Feb-1928 DOA: 06/26/2014  PCP: Walker Kehr, MD  Admit date: 06/26/2014 Discharge date: 07/01/2014  Time spent: >45 minutes  Recommendations for Outpatient Follow-up:  1. Home with hospice and 4 L O2    Discharge Diagnoses:  Principal Problem:   Acute on chronic combined systolic and diastolic CHF (congestive heart failure) Active Problems:   CAD   Atrial fibrillation with RVR   CKD (chronic kidney disease), stage III   DLBCL (diffuse large B cell lymphoma)   Acute respiratory failure with hypoxia   Cardiomyopathy, ischemic   COPD (chronic obstructive pulmonary disease)   Discharge Condition: stable  Diet recommendation: low sodium, heart healthy  Filed Weights   06/29/14 0300 06/30/14 0300 07/01/14 0509  Weight: 76 kg (167 lb 8.8 oz) 74 kg (163 lb 2.3 oz) 71 kg (156 lb 8.4 oz)    History of present illness:  Gabriel Estrada is a 80 y.o. male presents with a systolic and diastolic CHF exacerbation. He states his ankles have been swollen for 6 wks now. He has not gone to see his PCP in regards to this. He is taking his Lasix which was started by Dr Percival Spanish 32mo ago. Notes in EPIC reveal he is followed by hospice nurse at home. Last admitted in 2/14 for HCAP and CHF exacerbation  Subjective:  Continues state he feel well.   Hospital Course:  Principal Problem:  Acute on chronic combined systolic and diastolic CHF -- Acute respiratory failure with hypoxia  - weaned off of BiPAP to [place]-  - cardiology managing Lasix- becoming alkalotic- diamox added by cardiology  - neg balance by 7300 cc  - pulse ox about 94% on 4 L currently  - cardiology has signed off to today and state pt can go home with hospice - I will decrease his Lasix from 80 BID to 40 BID (was on 20 daily at home) - cont diamox started by cardiology  Active Problems:  Atrial fibrillation with RVR  - rate controlled on Dig, Coreg  - d/c Coumadin  per Cardiology recommendations and place on ASA   CKD (chronic kidney disease), stage III  - has remained stable with diuresis   DLBCL (diffuse large B cell lymphoma)  - follows with oncology- has a port- noted to have cancelled his last appt on 4/22 (per EPIC)  - received radiation treatments- completed in 1/15- was supposed to f/u in 1 mo   COPD  - - on O2 at home ordered by Dr Alain Marion- he is not sure of how much  - added Flutter valve, Mucinex and IS as he was quite congested  - cont Nebs Q6   Cardiomyopathy, ischemic  - per cardiology   Procedures:  none  Consultations:  Cardiology   Discharge Exam: Filed Vitals:   07/01/14 0509 07/01/14 0839 07/01/14 1351 07/01/14 1353  BP: 107/68 97/57 83/47  86/45  Pulse: 85 68 60   Temp: 98.1 F (36.7 C) 97.5 F (36.4 C) 99.2 F (37.3 C)   TempSrc: Oral Oral Oral   Resp: 22 16 18    Height:      Weight: 71 kg (156 lb 8.4 oz)     SpO2: 96% 95% 92%       General: AAO x 3, no distress Cardiovascular: IIRR, no murmurs Respiratory: crackles at bases  Discharge Instructions You were cared for by a hospitalist during your hospital stay. If you have any questions about your discharge medications or the care  you received while you were in the hospital after you are discharged, you can call the unit and asked to speak with the hospitalist on call if the hospitalist that took care of you is not available. Once you are discharged, your primary care physician will handle any further medical issues. Please note that NO REFILLS for any discharge medications will be authorized once you are discharged, as it is imperative that you return to your primary care physician (or establish a relationship with a primary care physician if you do not have one) for your aftercare needs so that they can reassess your need for medications and monitor your lab values.      Discharge Instructions   Diet - low sodium heart healthy    Complete by:  As  directed      Diet - low sodium heart healthy    Complete by:  As directed      Increase activity slowly    Complete by:  As directed      Increase activity slowly    Complete by:  As directed             Medication List    STOP taking these medications       warfarin 5 MG tablet  Commonly known as:  COUMADIN      TAKE these medications       acetaZOLAMIDE 250 MG tablet  Commonly known as:  DIAMOX  Take 1 tablet (250 mg total) by mouth 2 (two) times daily.     aspirin 325 MG tablet  Take 1 tablet (325 mg total) by mouth daily.     carvedilol 6.25 MG tablet  Commonly known as:  COREG  Take 1 tablet (6.25 mg total) by mouth 2 (two) times daily with a meal.     cholecalciferol 1000 UNITS tablet  Commonly known as:  VITAMIN D  Take 1 tablet (1,000 Units total) by mouth daily.     digoxin 0.125 MG tablet  Commonly known as:  LANOXIN  Take 0.125 mg by mouth daily.     ezetimibe-simvastatin 10-40 MG per tablet  Commonly known as:  VYTORIN  Take 1 tablet by mouth at bedtime.     furosemide 20 MG tablet  Commonly known as:  LASIX  Take 2 tablets (40 mg total) by mouth 2 (two) times daily.     guaiFENesin 600 MG 12 hr tablet  Commonly known as:  MUCINEX  Take 1 tablet (600 mg total) by mouth 2 (two) times daily.     levalbuterol 45 MCG/ACT inhaler  Commonly known as:  XOPENEX HFA  Inhale 1-2 puffs into the lungs every 6 (six) hours.     multivitamin with minerals Tabs tablet  Take 1 tablet by mouth daily.       No Known Allergies Follow-up Information   Follow up with [facility]. ([place] within 24-48 hours of discharge)    Specialty:  Hospice and Palliative Medicine   Contact information:   [place] [address] [phone]        The results of significant diagnostics from this hospitalization (including imaging, microbiology, ancillary and laboratory) are listed below for reference.     Significant Diagnostic Studies: Dg Chest Port 1 View  06/26/2014   CLINICAL DATA:  Chest pain  EXAM: PORTABLE CHEST - 1 VIEW  COMPARISON:  01/22/2014  FINDINGS: There is a right chest wall port a catheter with tip in the cavoatrial  junction. Moderate cardiac enlargement. Previous median sternotomy and CABG procedure. Fluid is identified within the oblique fissure. There are small bilateral pleural effusions and mild interstitial edema.  IMPRESSION: 1. Mild CHF.   Electronically Signed   By: Kerby Moors M.D.   On: 06/26/2014 21:51   Dg Chest Port 1v Same Day  06/29/2014   CLINICAL DATA:  Respiratory failure  EXAM: PORTABLE CHEST - 1 VIEW SAME DAY  COMPARISON:  06/26/2014  FINDINGS: Cardiac shadow is stable. Fluid within the major fissure is again noted on the left. Diffuse interstitial changes are now noted consistent with early congestive failure. A right chest wall port is again noted. Postsurgical changes are again seen.  IMPRESSION: Increasing interstitial changes consistent with congestive failure.   Electronically Signed   By: Inez Catalina M.D.   On: 06/29/2014 09:23   Dg Knee Left Port  06/27/2014   CLINICAL DATA:  Fall with knee pain  EXAM: PORTABLE LEFT KNEE - 1-2 VIEW  COMPARISON:  None.  FINDINGS: There may be a small knee joint effusion. No convincing fracture, no dislocation. No significant degenerative change for age. There are numerous vascular clips in the medial thigh and calf which may be related to arterial bypass or venous harvesting.  IMPRESSION: No acute osseous findings.  Possible small knee joint effusion.   Electronically Signed   By: Jorje Guild M.D.   On: 06/27/2014 06:24    Microbiology: Recent Results (from the past 240 hour(s))  MRSA PCR SCREENING     Status: None   Collection Time    06/27/14 12:31 AM      Result Value Ref Range Status   MRSA by PCR NEGATIVE  NEGATIVE Final   Comment:            The GeneXpert MRSA Assay (FDA     approved for NASAL specimens      only), is one component of a     comprehensive MRSA colonization     surveillance program. It is not     intended to diagnose MRSA     infection nor to guide or     monitor treatment for     MRSA infections.     Labs: Basic Metabolic Panel:  Recent Labs Lab 06/27/14 0142 06/28/14 0243 06/29/14 0245 06/30/14 0324 07/01/14 0850  NA 143 145 141 139 142  K 5.0 4.8 3.9 3.9 3.7  CL 99 99 95* 92* 93*  CO2 31 33* 35* 39* 37*  GLUCOSE 102* 95 114* 99 126*  BUN 55* 58* 62* 60* 60*  CREATININE 1.64* 1.91* 1.81* 1.85* 1.68*  CALCIUM 9.1 8.6 8.3* 8.7 8.9   Liver Function Tests:  Recent Labs Lab 06/27/14 0142  AST 30  ALT 26  ALKPHOS 111  BILITOT 1.3*  PROT 6.4  ALBUMIN 3.5   No results found for this basename: LIPASE, AMYLASE,  in the last 168 hours No results found for this basename: AMMONIA,  in the last 168 hours CBC:  Recent Labs Lab 06/26/14 2050 06/27/14 0142 06/30/14 0324  WBC 5.8 7.8 5.2  HGB 13.4 12.7* 11.4*  HCT 42.9 40.7 36.7*  MCV 96.4 96.0 95.6  PLT 149* 142* 146*   Cardiac Enzymes:  Recent Labs Lab 06/27/14 0142 06/27/14 0328 06/27/14 1031  TROPONINI <0.30 <0.30 <0.30   BNP: BNP (last 3 results)  Recent Labs  01/22/14 1816 01/24/14 0340 06/26/14 2050  PROBNP 4885.0* 5238.0* 12719.0*   CBG: No results found for this basename: GLUCAP,  in the last 168 hours     Signed:  Debbe Odea, MD  [facility] 07/01/2014, 4:49 PM

## 2014-07-01 NOTE — Progress Notes (Signed)
Patient currently resting soundly. Patient has condom cath on and is working properly. Pt has not complained of any pain or discomfort. Will continue to monitor patient to end of shift.

## 2014-07-01 NOTE — Progress Notes (Signed)
Subjective:  No cardiac complaints. No brady or tachyarrhythmia on the monitor over last shift. Occasional episodes of brief AIVR on background of AF with controlled rate. Frequent PVCs. Additional 2L net diuresis, 7.5L since admission. Weight down 7 kg since 06/27/14  Objective:  Temp:  [97.3 F (36.3 C)-98.4 F (36.9 C)] 97.5 F (36.4 C) (07/07 0839) Pulse Rate:  [68-88] 68 (07/07 0839) Resp:  [16-22] 16 (07/07 0839) BP: (94-107)/(45-68) 97/57 mmHg (07/07 0839) SpO2:  [94 %-97 %] 95 % (07/07 0839) Weight:  [156 lb 8.4 oz (71 kg)] 156 lb 8.4 oz (71 kg) (07/07 0509) Weight change: -6 lb 9.8 oz (-3 kg)  Intake/Output from previous day: 07/06 0701 - 07/07 0700 In: 243 [P.O.:240; I.V.:3] Out: 2550 [Urine:2550]  Intake/Output from this shift: Total I/O In: 120 [P.O.:120] Out: -   Medications: Current Facility-Administered Medications  Medication Dose Route Frequency Provider Last Rate Last Dose  . acetaminophen (TYLENOL) tablet 650 mg  650 mg Oral Q6H PRN Berle Mull, MD       Or  . acetaminophen (TYLENOL) suppository 650 mg  650 mg Rectal Q6H PRN Berle Mull, MD      . acetaZOLAMIDE (DIAMOX) tablet 250 mg  250 mg Oral BID Lelon Perla, MD   250 mg at 06/30/14 2325  . aspirin tablet 325 mg  325 mg Oral Daily Lelon Perla, MD   325 mg at 06/30/14 0909  . carvedilol (COREG) tablet 6.25 mg  6.25 mg Oral BID WC Berle Mull, MD   6.25 mg at 07/01/14 0842  . chlorhexidine (PERIDEX) 0.12 % solution 15 mL  15 mL Mouth/Throat BID Berle Mull, MD   15 mL at 06/30/14 2325  . ezetimibe-simvastatin (VYTORIN) 10-40 MG per tablet 1 tablet  1 tablet Oral QHS Berle Mull, MD   1 tablet at 06/30/14 2325  . furosemide (LASIX) tablet 80 mg  80 mg Oral BID Debbe Odea, MD   80 mg at 07/01/14 0842  . guaiFENesin (MUCINEX) 12 hr tablet 600 mg  600 mg Oral BID Debbe Odea, MD   600 mg at 06/30/14 2326  . heparin injection 5,000 Units  5,000 Units Subcutaneous 3 times per day Lelon Perla, MD   5,000 Units at 07/01/14 [phone]  . levalbuterol (XOPENEX) nebulizer solution 0.63 mg  0.63 mg Nebulization Q6H PRN Debbe Odea, MD      . ondansetron (ZOFRAN) tablet 4 mg  4 mg Oral Q6H PRN Berle Mull, MD       Or  . ondansetron (ZOFRAN) injection 4 mg  4 mg Intravenous Q6H PRN Berle Mull, MD      . sodium chloride 0.9 % injection 3 mL  3 mL Intravenous Q12H Berle Mull, MD   3 mL at 06/30/14 2326   Facility-Administered Medications Ordered in Other Encounters  Medication Dose Route Frequency Provider Last Rate Last Dose  . sodium chloride 0.9 % injection 10 mL  10 mL Intracatheter PRN Heath Lark, MD   10 mL at 10/16/13 1550    Physical Exam: General appearance: alert, cooperative and no distress Neck: no carotid bruit, no JVD and supple, symmetrical, trachea midline Lungs: clear to auscultation bilaterally Heart: irregularly irregular rhythm and S1, S2 normal Extremities: edema is trivial Neurologic: Grossly normal  Lab Results: Results for orders placed during the hospital encounter of 06/26/14 (from the past 48 hour(s))  BLOOD GAS, ARTERIAL     Status: Abnormal   Collection Time    06/29/14  1:00 PM      Result Value Ref Range   O2 Content 5.0     Delivery systems NASAL CANNULA     pH, Arterial 7.478 (*) 7.350 - 7.450   pCO2 arterial 51.9 (*) 35.0 - 45.0 mmHg   pO2, Arterial 67.6 (*) 80.0 - 100.0 mmHg   Bicarbonate 38.0 (*) 20.0 - 24.0 mEq/L   TCO2 39.6  0 - 100 mmol/L   Acid-Base Excess 13.5 (*) 0.0 - 2.0 mmol/L   O2 Saturation 93.8     Patient temperature 98.6     Collection site RIGHT RADIAL     Drawn by [id number]     Sample type ARTERIAL DRAW     Allens test (pass/fail) PASS  PASS  BASIC METABOLIC PANEL     Status: Abnormal   Collection Time    06/30/14  3:24 AM      Result Value Ref Range   Sodium 139  137 - 147 mEq/L   Potassium 3.9  3.7 - 5.3 mEq/L   Chloride 92 (*) 96 - 112 mEq/L   CO2 39 (*) 19 - 32 mEq/L   Glucose, Bld 99  70 - 99 mg/dL    BUN 60 (*) 6 - 23 mg/dL   Creatinine, Ser 1.85 (*) 0.50 - 1.35 mg/dL   Calcium 8.7  8.4 - 10.5 mg/dL   GFR calc non Af Amer 32 (*) >90 mL/min   GFR calc Af Amer 37 (*) >90 mL/min   Comment: (NOTE)     The eGFR has been calculated using the CKD EPI equation.     This calculation has not been validated in all clinical situations.     eGFR's persistently <90 mL/min signify possible Chronic Kidney     Disease.   Anion gap 8  5 - 15  CBC     Status: Abnormal   Collection Time    06/30/14  3:24 AM      Result Value Ref Range   WBC 5.2  4.0 - 10.5 K/uL   RBC 3.84 (*) 4.22 - 5.81 MIL/uL   Hemoglobin 11.4 (*) 13.0 - 17.0 g/dL   HCT 36.7 (*) 39.0 - 52.0 %   MCV 95.6  78.0 - 100.0 fL   MCH 29.7  26.0 - 34.0 pg   MCHC 31.1  30.0 - 36.0 g/dL   RDW 16.0 (*) 11.5 - 15.5 %   Platelets 146 (*) 150 - 400 K/uL    Imaging: Imaging results have been reviewed and Dg Chest Port 1v Same Day  06/29/2014   CLINICAL DATA:  Respiratory failure  EXAM: PORTABLE CHEST - 1 VIEW SAME DAY  COMPARISON:  06/26/2014  FINDINGS: Cardiac shadow is stable. Fluid within the major fissure is again noted on the left. Diffuse interstitial changes are now noted consistent with early congestive failure. A right chest wall port is again noted. Postsurgical changes are again seen.  IMPRESSION: Increasing interstitial changes consistent with congestive failure.   Electronically Signed   By: Inez Catalina M.D.   On: 06/29/2014 09:23    Assessment:  1. Principal Problem: 2.   Acute on chronic combined systolic and diastolic CHF (congestive heart failure) 3. Active Problems: 4.   CAD 5.   Atrial fibrillation with RVR 6.   CKD (chronic kidney disease), stage III 7.   DLBCL (diffuse large B cell lymphoma) 8.   Acute respiratory failure with hypoxia 9.   Cardiomyopathy, ischemic 10.   Plan:  1. Acute on  chronic CHF, compensated now. Following diuresis, creatinine above baseline but steady. Based on previous office visits, "dry  weight" is around 157 lb, so he does not require additional diuresis. 2. AF with adequate rate control. Not a candidate for chronic anticoagulation 3. Syncope - no recurrence. Keep off digoxin  Will sign off for now as focus is on palliative care. Please re-consult if needed.  Time Spent Directly with Patient:  30 minutes  Length of Stay:  LOS: 5 days    Markhi Kleckner 07/01/2014, 8:50 AM

## 2014-07-04 ENCOUNTER — Ambulatory Visit: Payer: Self-pay | Admitting: Pharmacist

## 2014-07-04 DIAGNOSIS — Z7901 Long term (current) use of anticoagulants: Secondary | ICD-10-CM

## 2014-07-04 DIAGNOSIS — I4891 Unspecified atrial fibrillation: Secondary | ICD-10-CM

## 2014-07-16 ENCOUNTER — Telehealth: Payer: Self-pay | Admitting: Internal Medicine

## 2014-07-16 NOTE — Telephone Encounter (Signed)
Noted. Thx.

## 2014-07-16 NOTE — Telephone Encounter (Signed)
Abby called from hospice [facility] to infrom Dr. Camila Li that Gabriel Estrada is going to be transfer from hospice to [facility] starting 07/17/14. Please give her a call if there is any question.

## 2014-07-30 ENCOUNTER — Ambulatory Visit: Payer: Medicare Other | Admitting: Cardiology

## 2014-11-01 IMAGING — NM NM CARDIA MUGA REST
6 series · 36 of 36 positions shown · non-contrast
Comparison: None

CLINICAL DATA: Lymphoma, pre cardia toxic chemotherapy

NUCLEAR MEDICINE CARDIAC MULTIPLE UPTAKE GATED ACQUISITION SCAN
(CARDIAC MUGA)
TECHNIQUE: Radiolabeled red blood cells used to perform resting
radionuclide ventriculography. Imaging performed in the anterior,
LAO, and lateral projections.  Resting left ventricular ejection
fraction estimated after drawing region of interest curves around
the left ventricle during systole and diastole.
Radiopharmaceutical: 25 mCi [id number] pertechnetate labeled autologous
red cells

[Series 1: mu muga · 4.34mm/px · 6 of 16 frames shown (1 of 6)]
[frame 2/16  full-range]
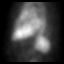
[frame 4/16  full-range]
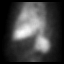
[frame 7/16  full-range]
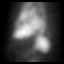
[frame 10/16  full-range]
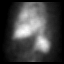
[frame 12/16  full-range]
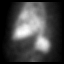
[frame 15/16  full-range]
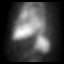

[Series 1: mu muga · 4.34mm/px · 6 of 16 frames shown (2 of 6)]
[frame 2/16]
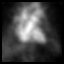
[frame 4/16]
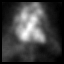
[frame 7/16]
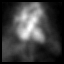
[frame 10/16]
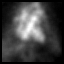
[frame 12/16]
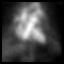
[frame 15/16]
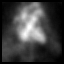

[Series 1: mu muga · 4.34mm/px · 6 of 16 frames shown (3 of 6)]
[frame 2/16]
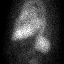
[frame 4/16]
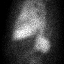
[frame 7/16]
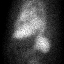
[frame 10/16]
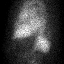
[frame 12/16]
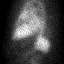
[frame 15/16]
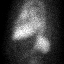

[Series 1: mu muga · 4.34mm/px · 6 of 16 frames shown (4 of 6)]
[frame 2/16]
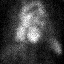
[frame 4/16]
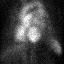
[frame 7/16]
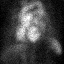
[frame 10/16]
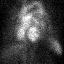
[frame 12/16]
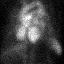
[frame 15/16]
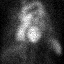

[Series 1: mu muga · 4.34mm/px · 6 of 16 frames shown (5 of 6)]
[frame 2/16]
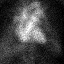
[frame 4/16]
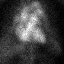
[frame 7/16]
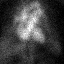
[frame 10/16]
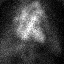
[frame 12/16]
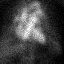
[frame 15/16]
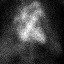

[Series 1: mu muga · 4.34mm/px · 6 of 16 frames shown (6 of 6)]
[frame 2/16]
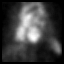
[frame 4/16]
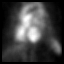
[frame 7/16]
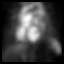
[frame 10/16]
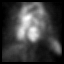
[frame 12/16]
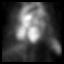
[frame 15/16]
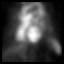

[36 of 36 positions shown; findings below may reference images not displayed]

FINDINGS: Left ventricular ejection fraction is calculated at 27%, well below
the normal range.
Acquisition performed at a cardiac rate of 96 beats per minute.
Wall motion analysis and three projections shows upper normal left
ventricular size with global hypokinesia greatest at apex..
IMPRESSION: Markedly decreased left ventricular ejection fraction of 27%.
Global hypokinesia of the left ventricle greatest at apex.

## 2016-05-26 DEATH — deceased

## 2016-08-02 ENCOUNTER — Ambulatory Visit: Admission: RE | Admit: 2016-08-02 | Payer: Medicare Other | Source: Ambulatory Visit
